# Patient Record
Sex: Female | Born: 1940 | Race: White | Hispanic: No | Marital: Married | Smoking: Never smoker
Health system: Southern US, Community
[De-identification: ages and names within clinical notes are randomized; demographics above are authoritative.]

## PROBLEM LIST (undated history)

## (undated) DIAGNOSIS — J302 Other seasonal allergic rhinitis: Secondary | ICD-10-CM

## (undated) DIAGNOSIS — I10 Essential (primary) hypertension: Secondary | ICD-10-CM

## (undated) DIAGNOSIS — K635 Polyp of colon: Secondary | ICD-10-CM

## (undated) HISTORY — PX: COLONOSCOPY: SHX174

## (undated) HISTORY — PX: LAPAROTOMY: SHX154

## (undated) HISTORY — PX: APPENDECTOMY: SHX54

## (undated) HISTORY — DX: Polyp of colon: K63.5

## (undated) HISTORY — DX: Other seasonal allergic rhinitis: J30.2

## (undated) HISTORY — PX: I & D EXTREMITY: SHX5045

## (undated) HISTORY — PX: ORIF HUMERUS FRACTURE: SHX2126

---

## 2009-06-06 ENCOUNTER — Emergency Department (HOSPITAL_COMMUNITY): Admission: EM | Admit: 2009-06-06 | Discharge: 2009-06-06 | Payer: Self-pay | Admitting: Emergency Medicine

## 2009-09-29 ENCOUNTER — Encounter: Payer: Self-pay | Admitting: Gastroenterology

## 2009-10-06 ENCOUNTER — Ambulatory Visit (HOSPITAL_COMMUNITY): Admission: RE | Admit: 2009-10-06 | Discharge: 2009-10-06 | Payer: Self-pay | Admitting: Gastroenterology

## 2009-10-06 ENCOUNTER — Ambulatory Visit: Payer: Self-pay | Admitting: Gastroenterology

## 2010-09-28 ENCOUNTER — Ambulatory Visit: Admission: AD | Admit: 2010-09-28 | Discharge: 2010-09-28 | Payer: Self-pay | Admitting: Internal Medicine

## 2011-03-18 LAB — URINALYSIS, ROUTINE W REFLEX MICROSCOPIC
Nitrite: POSITIVE — AB
Protein, ur: 30 mg/dL — AB
Specific Gravity, Urine: 1.015 (ref 1.005–1.030)
Urobilinogen, UA: 0.2 mg/dL (ref 0.0–1.0)

## 2011-03-18 LAB — DIFFERENTIAL
Basophils Relative: 0 % (ref 0–1)
Eosinophils Relative: 0 % (ref 0–5)
Lymphs Abs: 0.3 10*3/uL — ABNORMAL LOW (ref 0.7–4.0)
Monocytes Absolute: 0.3 10*3/uL (ref 0.1–1.0)
Neutro Abs: 8.5 10*3/uL — ABNORMAL HIGH (ref 1.7–7.7)

## 2011-03-18 LAB — CBC
HCT: 45.8 % (ref 36.0–46.0)
Hemoglobin: 15.9 g/dL — ABNORMAL HIGH (ref 12.0–15.0)
MCHC: 34.8 g/dL (ref 30.0–36.0)
MCV: 87.9 fL (ref 78.0–100.0)
WBC: 9.1 10*3/uL (ref 4.0–10.5)

## 2011-03-18 LAB — URINE CULTURE

## 2011-03-18 LAB — URINE MICROSCOPIC-ADD ON

## 2011-03-18 LAB — BASIC METABOLIC PANEL
BUN: 19 mg/dL (ref 6–23)
Calcium: 9.3 mg/dL (ref 8.4–10.5)
GFR calc non Af Amer: >60 mL/min (ref >60)
Glucose, Bld: 175 mg/dL — ABNORMAL HIGH (ref 70–99)
Potassium: 3.6 mEq/L (ref 3.5–5.1)
Sodium: 139 mEq/L (ref 135–145)

## 2013-09-06 ENCOUNTER — Encounter (HOSPITAL_COMMUNITY): Payer: Self-pay | Admitting: Pharmacy Technician

## 2013-09-06 NOTE — Patient Instructions (Addendum)
Tiffany Everett  09/06/2013   Your procedure is scheduled on:  09/13/13  Report to [facility] at [place] [facility].  Call this number if you have problems the morning of surgery: [phone]   Remember:   Do not eat food or drink liquids after midnight.   Take these medicines the morning of surgery with A SIP OF WATER: zantac   Do not wear jewelry, make-up or nail polish.  Do not wear lotions, powders, or perfumes. You may wear deodorant.  Do not shave 48 hours prior to surgery. Men may shave face and neck.  Do not bring valuables to the hospital.  [facility] is not responsible                  for any belongings or valuables.               Contacts, dentures or bridgework may not be worn into surgery.  Leave suitcase in the car. After surgery it may be brought to your room.  For patients admitted to the hospital, discharge time is determined by your                treatment team.               Patients discharged the day of surgery will not be allowed to drive  home.  Name and phone number of your driver: family  Special Instructions: N/A   Please read over the following fact sheets that you were given: Anesthesia Post-op Instructions and Care and Recovery After Surgery   PATIENT INSTRUCTIONS POST-ANESTHESIA  IMMEDIATELY FOLLOWING SURGERY:  Do not drive or operate machinery for the first twenty four hours after surgery.  Do not make any important decisions for twenty four hours after surgery or while taking narcotic pain medications or sedatives.  If you develop intractable nausea and vomiting or a severe headache please notify your doctor immediately.  FOLLOW-UP:  Please make an appointment with your surgeon as instructed. You do not need to follow up with anesthesia unless specifically instructed to do so.  WOUND CARE INSTRUCTIONS (if applicable):  Keep a dry clean dressing on the anesthesia/puncture wound site if there is drainage.  Once the wound has quit draining you may leave it  open to air.  Generally you should leave the bandage intact for twenty four hours unless there is drainage.  If the epidural site drains for more than 36-48 hours please call the anesthesia department.  QUESTIONS?:  Please feel free to call your physician or the hospital operator if you have any questions, and they will be happy to assist you.     Cataract Surgery  A cataract is a clouding of the lens of the eye. When a lens becomes cloudy, vision is reduced based on the degree and nature of the clouding. Surgery may be needed to improve vision. Surgery removes the cloudy lens and usually replaces it with a substitute lens (intraocular lens, IOL). LET YOUR EYE DOCTOR KNOW ABOUT:  Allergies to food or medicine.  Medicines taken including herbs, eyedrops, over-the-counter medicines, and creams.  Use of steroids (by mouth or creams).  Previous problems with anesthetics or numbing medicine.  History of bleeding problems or blood clots.  Previous surgery.  Other health problems, including diabetes and kidney problems.  Possibility of pregnancy, if this applies. RISKS AND COMPLICATIONS  Infection.  Inflammation of the eyeball (endophthalmitis) that can spread to both eyes (sympathetic ophthalmia).  Poor wound healing.  If an IOL is inserted, it can later fall out of proper position. This is very uncommon.  Clouding of the part of your eye that holds an IOL in place. This is called an "after-cataract." These are uncommon, but easily treated. BEFORE THE PROCEDURE  Do not eat or drink anything except small amounts of water for 8 to 12 before your surgery, or as directed by your caregiver.  Unless you are told otherwise, continue any eyedrops you have been prescribed.  Talk to your primary caregiver about all other medicines that you take (both prescription and non-prescription). In some cases, you may need to stop or change medicines near the time of your surgery. This is most  important if you are taking blood-thinning medicine.Do not stop medicines unless you are told to do so.  Arrange for someone to drive you to and from the procedure.  Do not put contact lenses in either eye on the day of your surgery. PROCEDURE There is more than one method for safely removing a cataract. Your doctor can explain the differences and help determine which is best for you. Phacoemulsification surgery is the most common form of cataract surgery.  An injection is given behind the eye or eyedrops are given to make this a painless procedure.  A small cut (incision) is made on the edge of the clear, dome-shaped surface that covers the front of the eye (cornea).  A tiny probe is painlessly inserted into the eye. This device gives off ultrasound waves that soften and break up the cloudy center of the lens. This makes it easier for the cloudy lens to be removed by suction.  An IOL may be implanted.  The normal lens of the eye is covered by a clear capsule. Part of that capsule is intentionally left in the eye to support the IOL.  Your surgeon may or may not use stitches to close the incision. There are other forms of cataract surgery that require a larger incision and stiches to close the eye. This approach is taken in cases where the doctor feels that the cataract cannot be easily removed using phacoemulsification. AFTER THE PROCEDURE  When an IOL is implanted, it does not need care. It becomes a permanent part of your eye and cannot be seen or felt.  Your doctor will schedule follow-up exams to check on your progress.  Review your other medicines with your doctor to see which can be resumed after surgery.  Use eyedrops or take medicine as prescribed by your doctor. Document Released: 11/14/2011 Document Revised: 02/17/2012 Document Reviewed: 11/14/2011 [facility] Patient Information 2014 [place], [place].

## 2013-09-07 ENCOUNTER — Encounter (HOSPITAL_COMMUNITY): Payer: Self-pay

## 2013-09-07 ENCOUNTER — Encounter (HOSPITAL_COMMUNITY)
Admission: RE | Admit: 2013-09-07 | Discharge: 2013-09-07 | Disposition: A | Discharge status: Home / Self Care | Payer: Medicare Other | Source: Ambulatory Visit | Attending: Ophthalmology | Admitting: Ophthalmology

## 2013-09-07 DIAGNOSIS — Z01812 Encounter for preprocedural laboratory examination: Secondary | ICD-10-CM | POA: Insufficient documentation

## 2013-09-07 DIAGNOSIS — Z01818 Encounter for other preprocedural examination: Secondary | ICD-10-CM | POA: Insufficient documentation

## 2013-09-07 DIAGNOSIS — Z01810 Encounter for preprocedural cardiovascular examination: Secondary | ICD-10-CM | POA: Insufficient documentation

## 2013-09-07 LAB — BASIC METABOLIC PANEL
BUN: 17 mg/dL (ref 6–23)
CO2: 26 mEq/L (ref 19–32)
Chloride: 103 mEq/L (ref 96–112)
Glucose, Bld: 97 mg/dL (ref 70–99)
Potassium: 3.8 mEq/L (ref 3.5–5.1)

## 2013-09-13 ENCOUNTER — Ambulatory Visit (HOSPITAL_COMMUNITY): Payer: Medicare Other | Admitting: Anesthesiology

## 2013-09-13 ENCOUNTER — Ambulatory Visit (HOSPITAL_COMMUNITY)
Admission: RE | Admit: 2013-09-13 | Discharge: 2013-09-13 | Disposition: A | Discharge status: Home / Self Care | Payer: Medicare Other | Source: Ambulatory Visit | Attending: Ophthalmology | Admitting: Ophthalmology

## 2013-09-13 ENCOUNTER — Encounter (HOSPITAL_COMMUNITY): Payer: Self-pay | Admitting: *Deleted

## 2013-09-13 ENCOUNTER — Encounter (HOSPITAL_COMMUNITY): Payer: Self-pay | Admitting: Anesthesiology

## 2013-09-13 ENCOUNTER — Encounter (HOSPITAL_COMMUNITY)
Admission: RE | Disposition: A | Discharge status: Home / Self Care | Payer: Self-pay | Source: Ambulatory Visit | Attending: Ophthalmology

## 2013-09-13 DIAGNOSIS — H2510 Age-related nuclear cataract, unspecified eye: Secondary | ICD-10-CM | POA: Insufficient documentation

## 2013-09-13 HISTORY — PX: CATARACT EXTRACTION W/PHACO: SHX586

## 2013-09-13 SURGERY — PHACOEMULSIFICATION, CATARACT, WITH IOL INSERTION
Anesthesia: Monitor Anesthesia Care | Site: Eye | Laterality: Right | Wound class: Clean

## 2013-09-13 MED ORDER — CYCLOPENTOLATE-PHENYLEPHRINE 0.2-1 % OP SOLN
1.0000 [drp] | OPHTHALMIC | Status: AC
  Administered 2013-09-13 (×3): 1 [drp] via OPHTHALMIC

## 2013-09-13 MED ORDER — FENTANYL CITRATE 0.05 MG/ML IJ SOLN
25.0000 ug | INTRAMUSCULAR | Status: AC
  Administered 2013-09-13 (×2): 25 ug via INTRAVENOUS

## 2013-09-13 MED ORDER — BSS IO SOLN
INTRAOCULAR | Status: DC | PRN
  Administered 2013-09-13: 15 mL via INTRAOCULAR

## 2013-09-13 MED ORDER — POVIDONE-IODINE 5 % OP SOLN
OPHTHALMIC | Status: DC | PRN
  Administered 2013-09-13: 1 via OPHTHALMIC

## 2013-09-13 MED ORDER — NA HYALUR & NA CHOND-NA HYALUR 0.55-0.5 ML IO KIT
PACK | INTRAOCULAR | Status: DC | PRN
  Administered 2013-09-13: 1 via OPHTHALMIC

## 2013-09-13 MED ORDER — LIDOCAINE HCL 3.5 % OP GEL
OPHTHALMIC | Status: DC | PRN
  Administered 2013-09-13: 1 via OPHTHALMIC

## 2013-09-13 MED ORDER — CYCLOPENTOLATE-PHENYLEPHRINE OP SOLN OPTIME - NO CHARGE
OPHTHALMIC | Status: AC
  Filled 2013-09-13: qty 2

## 2013-09-13 MED ORDER — MIDAZOLAM HCL 2 MG/2ML IJ SOLN
1.0000 mg | INTRAMUSCULAR | Status: DC | PRN
  Administered 2013-09-13: 2 mg via INTRAVENOUS

## 2013-09-13 MED ORDER — FENTANYL CITRATE 0.05 MG/ML IJ SOLN
INTRAMUSCULAR | Status: AC
  Filled 2013-09-13: qty 2

## 2013-09-13 MED ORDER — MIDAZOLAM HCL 2 MG/2ML IJ SOLN
INTRAMUSCULAR | Status: AC
  Filled 2013-09-13: qty 2

## 2013-09-13 MED ORDER — LIDOCAINE HCL 3.5 % OP GEL
OPHTHALMIC | Status: AC
  Filled 2013-09-13: qty 1

## 2013-09-13 MED ORDER — EPINEPHRINE HCL 1 MG/ML IJ SOLN
INTRAOCULAR | Status: DC | PRN
  Administered 2013-09-13: 08:00:00

## 2013-09-13 MED ORDER — LACTATED RINGERS IV SOLN
INTRAVENOUS | Status: DC
  Administered 2013-09-13: 07:00:00 via INTRAVENOUS

## 2013-09-13 MED ORDER — TETRACAINE HCL 0.5 % OP SOLN
1.0000 [drp] | OPHTHALMIC | Status: AC
  Administered 2013-09-13 (×3): 1 [drp] via OPHTHALMIC
  Filled 2013-09-13: qty 2

## 2013-09-13 MED ORDER — EPINEPHRINE HCL 1 MG/ML IJ SOLN
INTRAMUSCULAR | Status: AC
  Filled 2013-09-13: qty 1

## 2013-09-13 MED ORDER — TETRACAINE HCL 0.5 % OP SOLN
OPHTHALMIC | Status: DC | PRN
  Administered 2013-09-13: 1 [drp] via OPHTHALMIC

## 2013-09-13 SURGICAL SUPPLY — 27 items
CAPSULAR TENSION RING-AMO (OPHTHALMIC RELATED) IMPLANT
CLOTH BEACON ORANGE TIMEOUT ST (SAFETY) ×2 IMPLANT
GLOVE BIO SURGEON STRL SZ7.5 (GLOVE) IMPLANT
GLOVE BIOGEL M 6.5 STRL (GLOVE) IMPLANT
GLOVE BIOGEL PI IND STRL 6.5 (GLOVE) ×2 IMPLANT
GLOVE BIOGEL PI IND STRL 7.0 (GLOVE) IMPLANT
GLOVE BIOGEL PI INDICATOR 6.5 (GLOVE) ×2
GLOVE BIOGEL PI INDICATOR 7.0 (GLOVE)
GLOVE ECLIPSE 6.5 STRL STRAW (GLOVE) IMPLANT
GLOVE ECLIPSE 7.5 STRL STRAW (GLOVE) IMPLANT
GLOVE EXAM NITRILE LRG STRL (GLOVE) IMPLANT
GLOVE EXAM NITRILE MD LF STRL (GLOVE) ×2 IMPLANT
GLOVE SKINSENSE NS SZ6.5 (GLOVE)
GLOVE SKINSENSE NS SZ7.0 (GLOVE)
GLOVE SKINSENSE STRL SZ6.5 (GLOVE) IMPLANT
GLOVE SKINSENSE STRL SZ7.0 (GLOVE) IMPLANT
INST SET CATARACT ~~LOC~~ (KITS) ×2 IMPLANT
KIT VITRECTOMY (OPHTHALMIC RELATED) IMPLANT
PAD ARMBOARD 7.5X6 YLW CONV (MISCELLANEOUS) ×2 IMPLANT
PROC W NO LENS (INTRAOCULAR LENS)
PROC W SPEC LENS (INTRAOCULAR LENS)
PROCESS W NO LENS (INTRAOCULAR LENS) IMPLANT
PROCESS W SPEC LENS (INTRAOCULAR LENS) IMPLANT
RING MALYGIN (MISCELLANEOUS) IMPLANT
SIGHTPATH CAT PROC W REG LENS (Ophthalmic Related) ×2 IMPLANT
VISCOELASTIC ADDITIONAL (OPHTHALMIC RELATED) IMPLANT
WATER STERILE IRR 250ML POUR (IV SOLUTION) ×2 IMPLANT

## 2013-09-13 NOTE — Transfer of Care (Signed)
Immediate Anesthesia Transfer of Care Note  Patient: Tiffany Everett  Procedure(s) Performed: Procedure(s) (LRB): CATARACT EXTRACTION PHACO AND INTRAOCULAR LENS PLACEMENT (IOC) (Right)  Patient Location: Shortstay  Anesthesia Type: MAC  Level of Consciousness: awake  Airway & Oxygen Therapy: Patient Spontanous Breathing   Post-op Assessment: Report given to PACU RN, Post -op Vital signs reviewed and stable and Patient moving all extremities  Post vital signs: Reviewed and stable  Complications: No apparent anesthesia complications

## 2013-09-13 NOTE — H&P (Signed)
I have reviewed the pre printed H&P, the patient was re-examined, and I have identified no significant interval changes in the patient's medical condition.  There is no change in the plan of care since the history and physical of record. 

## 2013-09-13 NOTE — Op Note (Signed)
See scanned op note 

## 2013-09-13 NOTE — Brief Op Note (Signed)
09/13/2013  9:00 AM  PATIENT:  Tiffany Everett  72 y.o. female  PRE-OPERATIVE DIAGNOSIS:  surgical cataract right eye  POST-OPERATIVE DIAGNOSIS:  surgical cataract right eye  PROCEDURE:  Procedure(s): CATARACT EXTRACTION PHACO AND INTRAOCULAR LENS PLACEMENT (IOC)  SURGEON:  Surgeon(s): Susa Simmonds, MD  ASSISTANTS:  Laurena Bering, CST   ANESTHESIA STAFF: Anesthesiologist: Laurene Footman, MD CRNA: Franco Nones, CRNA  ANESTHESIA:   topical and MAC  REQUESTED LENS POWER: 24.0  LENS IMPLANT INFORMATION: Alcon [id number]  S/n [id number]  exp05/2019  CUMULATIVE DISSIPATED ENERGY:  3.25  INDICATIONS:see office H&P on chart  OP FINDINGS:moderately dense NS  COMPLICATIONS:None  DICTATION #: see scanned op note  PLAN OF CARE: as above  PATIENT DISPOSITION:  Short Stay

## 2013-09-13 NOTE — Anesthesia Procedure Notes (Signed)
Procedure Name: MAC Date/Time: 09/13/2013 7:50 AM Performed by: Franco Nones Pre-anesthesia Checklist: Patient identified, Emergency Drugs available, Suction available, Timeout performed and Patient being monitored Patient Re-evaluated:Patient Re-evaluated prior to inductionOxygen Delivery Method: Nasal Cannula

## 2013-09-13 NOTE — Anesthesia Postprocedure Evaluation (Signed)
  Anesthesia Post-op Note  Patient: Tiffany Everett  Procedure(s) Performed: Procedure(s) (LRB): CATARACT EXTRACTION PHACO AND INTRAOCULAR LENS PLACEMENT (IOC) (Right)  Patient Location:  Short Stay  Anesthesia Type: MAC  Level of Consciousness: awake  Airway and Oxygen Therapy: Patient Spontanous Breathing  Post-op Pain: none  Post-op Assessment: Post-op Vital signs reviewed, Patient's Cardiovascular Status Stable, Respiratory Function Stable, Patent Airway, No signs of Nausea or vomiting and Pain level controlled  Post-op Vital Signs: Reviewed and stable  Complications: No apparent anesthesia complications

## 2013-09-13 NOTE — Anesthesia Preprocedure Evaluation (Signed)
Anesthesia Evaluation  Patient identified by MRN, date of birth, ID band Patient awake    Reviewed: Allergy & Precautions, H&P , NPO status , Patient's Chart, lab work & pertinent test results  History of Anesthesia Complications Negative for: history of anesthetic complications  Airway Mallampati: II TM Distance: >3 FB   Mouth opening: Limited Mouth Opening Comment: Hx TMJ with limited opening Dental  (+) Teeth Intact   Pulmonary neg pulmonary ROS,  breath sounds clear to auscultation        Cardiovascular negative cardio ROS  Rhythm:Regular Rate:Normal     Neuro/Psych    GI/Hepatic negative GI ROS,   Endo/Other    Renal/GU      Musculoskeletal   Abdominal   Peds  Hematology   Anesthesia Other Findings   Reproductive/Obstetrics                           Anesthesia Physical Anesthesia Plan  ASA: I  Anesthesia Plan: MAC   Post-op Pain Management:    Induction: Intravenous  Airway Management Planned: Nasal Cannula  Additional Equipment:   Intra-op Plan:   Post-operative Plan:   Informed Consent: I have reviewed the patients History and Physical, chart, labs and discussed the procedure including the risks, benefits and alternatives for the proposed anesthesia with the patient or authorized representative who has indicated his/her understanding and acceptance.     Plan Discussed with:   Anesthesia Plan Comments:         Anesthesia Quick Evaluation

## 2013-09-14 ENCOUNTER — Encounter (HOSPITAL_COMMUNITY): Payer: Self-pay | Admitting: Ophthalmology

## 2013-11-17 MED ORDER — FENTANYL CITRATE 0.05 MG/ML IJ SOLN: 25.0000 ug | INTRAMUSCULAR | Status: DC | PRN

## 2013-11-17 MED ORDER — ONDANSETRON HCL 4 MG/2ML IJ SOLN: 4.0000 mg | Freq: Once | INTRAMUSCULAR | Status: AC | PRN

## 2013-11-17 NOTE — Patient Instructions (Signed)
Your procedure is scheduled on: 11/22/2013  Report to [facility] at  900  AM.  Call this number if you have problems the morning of surgery: [phone]   Do not eat food or drink liquids :After Midnight.      Take these medicines the morning of surgery with A SIP OF WATER: zantac   Do not wear jewelry, make-up or nail polish.  Do not wear lotions, powders, or perfumes.   Do not shave 48 hours prior to surgery.  Do not bring valuables to the hospital.  Contacts, dentures or bridgework may not be worn into surgery.  Leave suitcase in the car. After surgery it may be brought to your room.  For patients admitted to the hospital, checkout time is 11:00 AM the day of discharge.   Patients discharged the day of surgery will not be allowed to drive home.  :     Please read over the following fact sheets that you were given: Coughing and Deep Breathing, Surgical Site Infection Prevention, Anesthesia Post-op Instructions and Care and Recovery After Surgery    Cataract A cataract is a clouding of the lens of the eye. When a lens becomes cloudy, vision is reduced based on the degree and nature of the clouding. Many cataracts reduce vision to some degree. Some cataracts make people more near-sighted as they develop. Other cataracts increase glare. Cataracts that are ignored and become worse can sometimes look white. The white color can be seen through the pupil. CAUSES   Aging. However, cataracts may occur at any age, even in newborns.   Certain drugs.   Trauma to the eye.   Certain diseases such as diabetes.   Specific eye diseases such as chronic inflammation inside the eye or a sudden attack of a rare form of glaucoma.   Inherited or acquired medical problems.  SYMPTOMS   Gradual, progressive drop in vision in the affected eye.   Severe, rapid visual loss. This most often happens when trauma is the cause.  DIAGNOSIS  To detect a cataract, an eye doctor examines the lens. Cataracts  are best diagnosed with an exam of the eyes with the pupils enlarged (dilated) by drops.  TREATMENT  For an early cataract, vision may improve by using different eyeglasses or stronger lighting. If that does not help your vision, surgery is the only effective treatment. A cataract needs to be surgically removed when vision loss interferes with your everyday activities, such as driving, reading, or watching TV. A cataract may also have to be removed if it prevents examination or treatment of another eye problem. Surgery removes the cloudy lens and usually replaces it with a substitute lens (intraocular lens, IOL).  At a time when both you and your doctor agree, the cataract will be surgically removed. If you have cataracts in both eyes, only one is usually removed at a time. This allows the operated eye to heal and be out of danger from any possible problems after surgery (such as infection or poor wound healing). In rare cases, a cataract may be doing damage to your eye. In these cases, your caregiver may advise surgical removal right away. The vast majority of people who have cataract surgery have better vision afterward. HOME CARE INSTRUCTIONS  If you are not planning surgery, you may be asked to do the following:  Use different eyeglasses.   Use stronger or brighter lighting.   Ask your eye doctor about reducing your medicine dose or changing medicines if  it is thought that a medicine caused your cataract. Changing medicines does not make the cataract go away on its own.   Become familiar with your surroundings. Poor vision can lead to injury. Avoid bumping into things on the affected side. You are at a higher risk for tripping or falling.   Exercise extreme care when driving or operating machinery.   Wear sunglasses if you are sensitive to bright light or experiencing problems with glare.  SEEK IMMEDIATE MEDICAL CARE IF:   You have a worsening or sudden vision loss.   You notice redness,  swelling, or increasing pain in the eye.   You have a fever.  Document Released: 11/25/2005 Document Revised: 11/14/2011 Document Reviewed: 07/19/2011 [facility] Patient Information 2012 [place].PATIENT INSTRUCTIONS POST-ANESTHESIA  IMMEDIATELY FOLLOWING SURGERY:  Do not drive or operate machinery for the first twenty four hours after surgery.  Do not make any important decisions for twenty four hours after surgery or while taking narcotic pain medications or sedatives.  If you develop intractable nausea and vomiting or a severe headache please notify your doctor immediately.  FOLLOW-UP:  Please make an appointment with your surgeon as instructed. You do not need to follow up with anesthesia unless specifically instructed to do so.  WOUND CARE INSTRUCTIONS (if applicable):  Keep a dry clean dressing on the anesthesia/puncture wound site if there is drainage.  Once the wound has quit draining you may leave it open to air.  Generally you should leave the bandage intact for twenty four hours unless there is drainage.  If the epidural site drains for more than 36-48 hours please call the anesthesia department.  QUESTIONS?:  Please feel free to call your physician or the hospital operator if you have any questions, and they will be happy to assist you.

## 2013-11-18 ENCOUNTER — Encounter (HOSPITAL_COMMUNITY): Payer: Self-pay | Admitting: Pharmacy Technician

## 2013-11-18 ENCOUNTER — Encounter (HOSPITAL_COMMUNITY)
Admission: RE | Admit: 2013-11-18 | Discharge: 2013-11-18 | Disposition: A | Discharge status: Home / Self Care | Payer: Medicare Other | Source: Ambulatory Visit | Attending: Anesthesiology | Admitting: Anesthesiology

## 2013-11-22 ENCOUNTER — Ambulatory Visit (HOSPITAL_COMMUNITY)
Admission: RE | Admit: 2013-11-22 | Discharge: 2013-11-22 | Disposition: A | Discharge status: Home / Self Care | Payer: Medicare Other | Source: Ambulatory Visit | Attending: Ophthalmology | Admitting: Ophthalmology

## 2013-11-22 ENCOUNTER — Encounter (HOSPITAL_COMMUNITY)
Admission: RE | Disposition: A | Discharge status: Home / Self Care | Payer: Self-pay | Source: Ambulatory Visit | Attending: Ophthalmology

## 2013-11-22 ENCOUNTER — Ambulatory Visit (HOSPITAL_COMMUNITY): Payer: Medicare Other | Admitting: Anesthesiology

## 2013-11-22 ENCOUNTER — Encounter (HOSPITAL_COMMUNITY): Payer: Self-pay | Admitting: *Deleted

## 2013-11-22 ENCOUNTER — Encounter (HOSPITAL_COMMUNITY): Payer: Medicare Other | Admitting: Anesthesiology

## 2013-11-22 DIAGNOSIS — H2510 Age-related nuclear cataract, unspecified eye: Secondary | ICD-10-CM | POA: Insufficient documentation

## 2013-11-22 HISTORY — PX: CATARACT EXTRACTION W/PHACO: SHX586

## 2013-11-22 SURGERY — PHACOEMULSIFICATION, CATARACT, WITH IOL INSERTION
Anesthesia: Monitor Anesthesia Care | Site: Eye | Laterality: Left

## 2013-11-22 MED ORDER — FENTANYL CITRATE 0.05 MG/ML IJ SOLN
25.0000 ug | INTRAMUSCULAR | Status: AC
  Administered 2013-11-22: 25 ug via INTRAVENOUS

## 2013-11-22 MED ORDER — NA HYALUR & NA CHOND-NA HYALUR 0.55-0.5 ML IO KIT
PACK | INTRAOCULAR | Status: DC | PRN
  Administered 2013-11-22: 1 via OPHTHALMIC

## 2013-11-22 MED ORDER — MIDAZOLAM HCL 2 MG/2ML IJ SOLN
INTRAMUSCULAR | Status: AC
  Filled 2013-11-22: qty 2

## 2013-11-22 MED ORDER — CYCLOPENTOLATE-PHENYLEPHRINE 0.2-1 % OP SOLN
1.0000 [drp] | OPHTHALMIC | Status: AC
  Administered 2013-11-22 (×3): 1 [drp] via OPHTHALMIC

## 2013-11-22 MED ORDER — LIDOCAINE HCL 3.5 % OP GEL
OPHTHALMIC | Status: DC | PRN
  Administered 2013-11-22: 1 via OPHTHALMIC

## 2013-11-22 MED ORDER — TETRACAINE HCL 0.5 % OP SOLN
OPHTHALMIC | Status: AC
  Filled 2013-11-22: qty 2

## 2013-11-22 MED ORDER — FENTANYL CITRATE 0.05 MG/ML IJ SOLN
INTRAMUSCULAR | Status: AC
  Filled 2013-11-22: qty 2

## 2013-11-22 MED ORDER — CYCLOPENTOLATE-PHENYLEPHRINE OP SOLN OPTIME - NO CHARGE
OPHTHALMIC | Status: AC
  Filled 2013-11-22: qty 2

## 2013-11-22 MED ORDER — TETRACAINE HCL 0.5 % OP SOLN
OPHTHALMIC | Status: DC | PRN
  Administered 2013-11-22: 1 [drp] via OPHTHALMIC

## 2013-11-22 MED ORDER — MIDAZOLAM HCL 2 MG/2ML IJ SOLN
1.0000 mg | INTRAMUSCULAR | Status: DC | PRN
  Administered 2013-11-22 (×2): 2 mg via INTRAVENOUS

## 2013-11-22 MED ORDER — TETRACAINE HCL 0.5 % OP SOLN
1.0000 [drp] | OPHTHALMIC | Status: AC
  Administered 2013-11-22 (×3): 1 [drp] via OPHTHALMIC

## 2013-11-22 MED ORDER — POVIDONE-IODINE 5 % OP SOLN
OPHTHALMIC | Status: DC | PRN
  Administered 2013-11-22: 1 via OPHTHALMIC

## 2013-11-22 MED ORDER — LIDOCAINE HCL 3.5 % OP GEL
OPHTHALMIC | Status: AC
  Filled 2013-11-22: qty 1

## 2013-11-22 MED ORDER — BSS IO SOLN
INTRAOCULAR | Status: DC | PRN
  Administered 2013-11-22: 15 mL via INTRAOCULAR

## 2013-11-22 MED ORDER — LACTATED RINGERS IV SOLN
INTRAVENOUS | Status: DC
  Administered 2013-11-22: 08:00:00 via INTRAVENOUS

## 2013-11-22 MED ORDER — EPINEPHRINE HCL 1 MG/ML IJ SOLN
INTRAOCULAR | Status: DC | PRN
  Administered 2013-11-22: 09:00:00

## 2013-11-22 MED ORDER — EPINEPHRINE HCL 1 MG/ML IJ SOLN
INTRAMUSCULAR | Status: AC
  Filled 2013-11-22: qty 1

## 2013-11-22 MED ORDER — LIDOCAINE HCL 3.5 % OP GEL
1.0000 "application " | Freq: Once | OPHTHALMIC | Status: AC
  Administered 2013-11-22: 1 via OPHTHALMIC

## 2013-11-22 SURGICAL SUPPLY — 27 items
CAPSULAR TENSION RING-AMO (OPHTHALMIC RELATED) IMPLANT
CLOTH BEACON ORANGE TIMEOUT ST (SAFETY) ×2 IMPLANT
GLOVE BIO SURGEON STRL SZ7.5 (GLOVE) IMPLANT
GLOVE BIOGEL M 6.5 STRL (GLOVE) IMPLANT
GLOVE BIOGEL PI IND STRL 6.5 (GLOVE) ×2 IMPLANT
GLOVE BIOGEL PI IND STRL 7.0 (GLOVE) IMPLANT
GLOVE BIOGEL PI INDICATOR 6.5 (GLOVE) ×2
GLOVE BIOGEL PI INDICATOR 7.0 (GLOVE)
GLOVE ECLIPSE 6.5 STRL STRAW (GLOVE) IMPLANT
GLOVE ECLIPSE 7.5 STRL STRAW (GLOVE) IMPLANT
GLOVE EXAM NITRILE LRG STRL (GLOVE) IMPLANT
GLOVE EXAM NITRILE MD LF STRL (GLOVE) IMPLANT
GLOVE SKINSENSE NS SZ6.5 (GLOVE)
GLOVE SKINSENSE NS SZ7.0 (GLOVE)
GLOVE SKINSENSE STRL SZ6.5 (GLOVE) IMPLANT
GLOVE SKINSENSE STRL SZ7.0 (GLOVE) IMPLANT
INST SET CATARACT ~~LOC~~ (KITS) ×2 IMPLANT
KIT VITRECTOMY (OPHTHALMIC RELATED) IMPLANT
PAD ARMBOARD 7.5X6 YLW CONV (MISCELLANEOUS) ×2 IMPLANT
PROC W NO LENS (INTRAOCULAR LENS)
PROC W SPEC LENS (INTRAOCULAR LENS)
PROCESS W NO LENS (INTRAOCULAR LENS) IMPLANT
PROCESS W SPEC LENS (INTRAOCULAR LENS) IMPLANT
RING MALYGIN (MISCELLANEOUS) IMPLANT
SIGHTPATH CAT PROC W REG LENS (Ophthalmic Related) ×2 IMPLANT
VISCOELASTIC ADDITIONAL (OPHTHALMIC RELATED) IMPLANT
WATER STERILE IRR 250ML POUR (IV SOLUTION) ×2 IMPLANT

## 2013-11-22 NOTE — Transfer of Care (Signed)
Immediate Anesthesia Transfer of Care Note  Patient: Tiffany Everett  Procedure(s) Performed: Procedure(s) with comments: CATARACT EXTRACTION PHACO AND INTRAOCULAR LENS PLACEMENT (IOC) (Left) - CDE: 3.95  Patient Location: Short Stay  Anesthesia Type:MAC  Level of Consciousness: awake, alert , oriented and patient cooperative  Airway & Oxygen Therapy: Patient Spontanous Breathing  Post-op Assessment: Report given to PACU RN, Post -op Vital signs reviewed and stable and Patient moving all extremities  Post vital signs: Reviewed and stable  Complications: No apparent anesthesia complications

## 2013-11-22 NOTE — Brief Op Note (Signed)
11/22/2013  12:23 PM  PATIENT:  Tiffany Everett  72 y.o. female  PRE-OPERATIVE DIAGNOSIS:  nuclear cataract left eye  POST-OPERATIVE DIAGNOSIS:  nuclear cataract left eye  PROCEDURE:  Procedure(s): CATARACT EXTRACTION PHACO AND INTRAOCULAR LENS PLACEMENT (IOC)  SURGEON:  Surgeon(s): Susa Simmonds, MD  ASSISTANTS: Laurena Bering, CST   ANESTHESIA STAFF: Anesthesiologist: Laurene Footman, MD CRNA: Despina Hidden, CRNA  ANESTHESIA:   topical and MAC  REQUESTED LENS POWER: 25.5  LENS IMPLANT INFORMATION:  Alcon SN60 WF  S/n [id number]  exp02/2017 CUMULATIVE DISSIPATED ENERGY:3.95  INDICATIONS:see scanned office H&P  OP FINDINGS:dense NS  COMPLICATIONS:None  DICTATION #: see scanned op note  PLAN OF CARE: as above  PATIENT DISPOSITION:  Short Stay

## 2013-11-22 NOTE — H&P (Signed)
I have reviewed the pre printed H&P, the patient was re-examined, and I have identified no significant interval changes in the patient's medical condition.  There is no change in the plan of care since the history and physical of record. 

## 2013-11-22 NOTE — Op Note (Signed)
See scanned op note 

## 2013-11-22 NOTE — Anesthesia Postprocedure Evaluation (Signed)
  Anesthesia Post-op Note  Patient: Tiffany Everett  Procedure(s) Performed: Procedure(s) with comments: CATARACT EXTRACTION PHACO AND INTRAOCULAR LENS PLACEMENT (IOC) (Left) - CDE: 3.95  Patient Location: Short Stay  Anesthesia Type:MAC  Level of Consciousness: awake, alert , oriented and patient cooperative  Airway and Oxygen Therapy: Patient Spontanous Breathing  Post-op Pain: none  Post-op Assessment: Post-op Vital signs reviewed, Patient's Cardiovascular Status Stable, Respiratory Function Stable, Patent Airway and Pain level controlled  Post-op Vital Signs: Reviewed and stable  Complications: No apparent anesthesia complications

## 2013-11-22 NOTE — Anesthesia Preprocedure Evaluation (Signed)
Anesthesia Evaluation  Patient identified by MRN, date of birth, ID band Patient awake    Reviewed: Allergy & Precautions, H&P , NPO status , Patient's Chart, lab work & pertinent test results  History of Anesthesia Complications Negative for: history of anesthetic complications  Airway Mallampati: II TM Distance: >3 FB   Mouth opening: Limited Mouth Opening Comment: Hx TMJ with limited opening Dental  (+) Teeth Intact   Pulmonary neg pulmonary ROS,  breath sounds clear to auscultation        Cardiovascular negative cardio ROS  Rhythm:Regular Rate:Normal     Neuro/Psych    GI/Hepatic negative GI ROS,   Endo/Other    Renal/GU      Musculoskeletal   Abdominal   Peds  Hematology   Anesthesia Other Findings   Reproductive/Obstetrics                           Anesthesia Physical Anesthesia Plan  ASA: I  Anesthesia Plan: MAC   Post-op Pain Management:    Induction: Intravenous  Airway Management Planned: Nasal Cannula  Additional Equipment:   Intra-op Plan:   Post-operative Plan:   Informed Consent: I have reviewed the patients History and Physical, chart, labs and discussed the procedure including the risks, benefits and alternatives for the proposed anesthesia with the patient or authorized representative who has indicated his/her understanding and acceptance.     Plan Discussed with:   Anesthesia Plan Comments:         Anesthesia Quick Evaluation  

## 2013-11-24 ENCOUNTER — Encounter (HOSPITAL_COMMUNITY): Payer: Self-pay | Admitting: Ophthalmology

## 2014-08-17 ENCOUNTER — Encounter: Payer: Self-pay | Admitting: Gastroenterology

## 2015-03-02 DIAGNOSIS — Z6827 Body mass index (BMI) 27.0-27.9, adult: Secondary | ICD-10-CM | POA: Diagnosis not present

## 2015-03-02 DIAGNOSIS — H9192 Unspecified hearing loss, left ear: Secondary | ICD-10-CM | POA: Diagnosis not present

## 2015-03-02 DIAGNOSIS — H6122 Impacted cerumen, left ear: Secondary | ICD-10-CM | POA: Diagnosis not present

## 2015-03-10 DIAGNOSIS — I10 Essential (primary) hypertension: Secondary | ICD-10-CM | POA: Diagnosis not present

## 2015-03-10 DIAGNOSIS — E782 Mixed hyperlipidemia: Secondary | ICD-10-CM | POA: Diagnosis not present

## 2015-03-21 ENCOUNTER — Telehealth: Payer: Self-pay

## 2015-03-21 NOTE — Telephone Encounter (Signed)
LMOM to call.  I need to reschedule appt. ( It was inadvertently put in for 4/25/20170. The slot for 04/03/2015 is taken. Will reschedule pt when she calls.

## 2015-03-21 NOTE — Telephone Encounter (Signed)
[facility] referral was sent and is stapled onto the patient's records

## 2015-03-21 NOTE — Telephone Encounter (Signed)
Pt was referred by Darlin Cocor.Zack Hall for screening colonoscopy. Her last one was by Dr. Darrick PennaFields 10/06/2009 and she was to have her next in 5 years. ( Personal hx of polyps) Pt is scheduled for OV with Wynne DustEric Gill, NP on 04/03/2015 at 1:30 PM.  Darl PikesSusan will check and see if she needs another referral from [facility] for the OV.

## 2015-03-23 NOTE — Telephone Encounter (Signed)
Pt has been rescheduled to 04/06/2015 at 2:00 PM with Wynne DustEric Gill, NP.

## 2015-04-06 ENCOUNTER — Ambulatory Visit: Payer: Commercial Managed Care - HMO | Admitting: Nurse Practitioner

## 2015-04-24 ENCOUNTER — Encounter: Payer: Self-pay | Admitting: Nurse Practitioner

## 2015-04-24 ENCOUNTER — Other Ambulatory Visit: Payer: Self-pay

## 2015-04-24 ENCOUNTER — Ambulatory Visit (INDEPENDENT_AMBULATORY_CARE_PROVIDER_SITE_OTHER): Payer: Commercial Managed Care - HMO | Admitting: Nurse Practitioner

## 2015-04-24 VITALS — BP 144/86 | HR 84 | Temp 97.1°F | Ht 68.0 in | Wt 191.2 lb

## 2015-04-24 DIAGNOSIS — Z860100 Personal history of colon polyps, unspecified: Secondary | ICD-10-CM

## 2015-04-24 MED ORDER — NA SULFATE-K SULFATE-MG SULF 17.5-3.13-1.6 GM/177ML PO SOLN: 1.0000 | Freq: Once | ORAL | Status: AC

## 2015-04-24 NOTE — Assessment & Plan Note (Signed)
[age] old female presents for surveillance colonoscopy due to high risk individual for history of colon polyps as well as family history of colon cancer as both her mother and father had colon cancer in her 5270s. Last colonoscopy completed about 6 years ago showed no polyps, recommend repeat colonoscopy in 5 years. Patient is essentially a symptomatically GI standpoint. 6 she cannot tolerate high-volume prep is requesting a low volume prep. At this point we'll move forward with this surveillance colonoscopy.  Proceed with colonoscopy with Dr. Darrick PennaFields in the near future. The risks, benefits, and alternatives have been discussed in detail with the patient. They state understanding and desire to proceed.   Patient is not on any anticoagulants, antidiabetic medications, long-term pain medications, antianxiety medications, or antidepressants. Should be appropriate for conscious sedation.

## 2015-04-24 NOTE — Patient Instructions (Signed)
1. We will schedule your procedure (colonoscopy) for you. 2. Further recommendations to be based on results your procedure. 

## 2015-04-24 NOTE — Progress Notes (Signed)
Primary Care Physician:  Catalina PizzaHALL, ZACH, MD Primary Gastroenterologist:  Dr. Darrick PennaFields  Chief Complaint  Patient presents with  . Colonoscopy    hx of polyps    HPI:   74 year old female referred for repeat surveillance colonoscopy. Has a personal history of polyps. Last colonoscopy 2010 found frequent sigmoid colon diverticula, otherwise no polyps, masses, inflammatory changes, or AVMs. Recommend repeat in 5 years.  Today she states she is doing quite well. Denies abdominal pain, N/V, hematochezia, melena, chest pain, dyspnea, fatigue, unintentional weight loss, fever, chills. Denies any other upper or lower GI symptoms.    Past Medical History  Diagnosis Date  . Seasonal allergies   . Colon polyps     Past Surgical History  Procedure Laterality Date  . Laparotomy      oopherectomy  . Appendectomy    . Orif humerus fracture Left   . Cataract extraction w/phaco Right 09/13/2013    Procedure: CATARACT EXTRACTION PHACO AND INTRAOCULAR LENS PLACEMENT (IOC);  Surgeon: Susa Simmondsarroll F Haines, MD;  Location: [facility];  Service: Ophthalmology;  Laterality: Right;  CDE:3.25  . I&d extremity Left     lateral wrist  . Cesarean section      x2  . Cataract extraction w/phaco Left 11/22/2013    Procedure: CATARACT EXTRACTION PHACO AND INTRAOCULAR LENS PLACEMENT (IOC);  Surgeon: Susa Simmondsarroll F Haines, MD;  Location: [facility];  Service: Ophthalmology;  Laterality: Left;  CDE: 3.95  . Colonoscopy      SLF 2010: sigmoid diverticula, otherwise wnl. Repeat 5 yrs.    Current Outpatient Prescriptions  Medication Sig Dispense Refill  . chlorpheniramine (CHLOR-TRIMETON) 4 MG tablet Take 8 mg by mouth at bedtime.    . cromolyn (NASALCROM) 5.2 MG/ACT nasal spray Place 1 spray into the nose daily as needed for allergies.    . fluticasone (FLONASE) 50 MCG/ACT nasal spray Place into both nostrils daily. As needed     No current facility-administered medications for this visit.    Allergies as of 04/24/2015  .  (No Known Allergies)    Family History  Problem Relation Age of Onset  . Colon cancer Mother 7170  . Colon cancer Father 4275    History   Social History  . Marital Status: Married    Spouse Name: N/A  . Number of Children: N/A  . Years of Education: N/A   Occupational History  . Not on file.   Social History Main Topics  . Smoking status: Never Smoker   . Smokeless tobacco: Never Used     Comment: Never smoked  . Alcohol Use: No  . Drug Use: No  . Sexual Activity: Not on file   Other Topics Concern  . Not on file   Social History Narrative    Review of Systems: General: Negative for anorexia, weight loss, fever, chills, fatigue, weakness. Eyes: Negative for vision changes.  ENT: Negative for hoarseness, difficulty swallowing. CV: Negative for chest pain, angina, palpitations, peripheral edema.  Respiratory: Negative for dyspnea at rest, cough, wheezing.  GI: See history of present illness. MS: Negative for joint pain, low back pain.  Derm: Negative for rash or itching.  Neuro: Negative for weakness, memory loss, confusion.  Psych: Negative for anxiety, depression.  Endo: Negative for unusual weight change.  Heme: Negative for bruising or bleeding. Allergy: Negative for rash or hives.    Physical Exam: BP 144/86 mmHg  Pulse 84  Temp(Src) 97.1 F (36.2 C) (Oral)  Ht 5\' 8"  (1.727 m)  Wt 191 lb 3.2 oz (86.728 kg)  BMI 29.08 kg/m2 General:   Alert and oriented. Pleasant and cooperative. Well-nourished and well-developed.  Head:  Normocephalic and atraumatic. Eyes:  Without icterus, sclera clear and conjunctiva pink.  Ears:  Normal auditory acuity. Mouth:  No deformity or lesions, oral mucosa pink. No OP edema. Neck:  Supple, without mass or thyromegaly. Lungs:  Clear to auscultation bilaterally. No wheezes, rales, or rhonchi. No distress.  Heart:  S1, S2 present without murmurs appreciated.  Abdomen:  +BS, soft, non-tender and non-distended. No HSM noted. No  guarding or rebound. No masses appreciated.  Rectal:  Deferred  Msk:  Symmetrical without gross deformities. Normal posture. Pulses:  Normal PT pulses noted. Extremities:  Without clubbing or edema. Neurologic:  Alert and  oriented x4;  grossly normal neurologically. Skin:  Intact without significant lesions or rashes. Cervical Nodes:  No significant cervical adenopathy.  Psych:  Alert and cooperative. Normal mood and affect.     04/24/2015 2:10 PM

## 2015-04-26 NOTE — Progress Notes (Signed)
cc'ed to pcp °

## 2015-05-18 ENCOUNTER — Ambulatory Visit (HOSPITAL_COMMUNITY)
Admission: RE | Admit: 2015-05-18 | Payer: Commercial Managed Care - HMO | Source: Ambulatory Visit | Admitting: Gastroenterology

## 2015-05-18 ENCOUNTER — Encounter (HOSPITAL_COMMUNITY): Admission: RE | Payer: Self-pay | Source: Ambulatory Visit

## 2015-05-18 SURGERY — COLONOSCOPY
Anesthesia: Moderate Sedation

## 2015-05-18 NOTE — OR Nursing (Signed)
Patient called and said that she got her dates mixed up and had not drank her prep for colonoscopy today. Instructed patient that I would notify the office and have them call her to reschedule. Ginger at Dr. Darrick Penna office notified of the above.

## 2015-12-07 DIAGNOSIS — Z23 Encounter for immunization: Secondary | ICD-10-CM | POA: Diagnosis not present

## 2016-04-02 ENCOUNTER — Ambulatory Visit: Payer: Self-pay | Admitting: Nurse Practitioner

## 2016-09-02 DIAGNOSIS — Z23 Encounter for immunization: Secondary | ICD-10-CM | POA: Diagnosis not present

## 2017-07-22 DIAGNOSIS — Z6830 Body mass index (BMI) 30.0-30.9, adult: Secondary | ICD-10-CM | POA: Diagnosis not present

## 2017-07-22 DIAGNOSIS — L308 Other specified dermatitis: Secondary | ICD-10-CM | POA: Diagnosis not present

## 2017-08-29 DIAGNOSIS — I10 Essential (primary) hypertension: Secondary | ICD-10-CM | POA: Diagnosis not present

## 2017-09-02 DIAGNOSIS — E782 Mixed hyperlipidemia: Secondary | ICD-10-CM | POA: Diagnosis not present

## 2017-09-02 DIAGNOSIS — Z6830 Body mass index (BMI) 30.0-30.9, adult: Secondary | ICD-10-CM | POA: Diagnosis not present

## 2017-09-02 DIAGNOSIS — R7301 Impaired fasting glucose: Secondary | ICD-10-CM | POA: Diagnosis not present

## 2017-09-02 DIAGNOSIS — Z23 Encounter for immunization: Secondary | ICD-10-CM | POA: Diagnosis not present

## 2017-09-02 DIAGNOSIS — R5383 Other fatigue: Secondary | ICD-10-CM | POA: Diagnosis not present

## 2017-09-02 DIAGNOSIS — R944 Abnormal results of kidney function studies: Secondary | ICD-10-CM | POA: Diagnosis not present

## 2017-11-25 ENCOUNTER — Emergency Department (HOSPITAL_COMMUNITY): Payer: Medicare HMO

## 2017-11-25 ENCOUNTER — Encounter (HOSPITAL_COMMUNITY): Payer: Self-pay | Admitting: Emergency Medicine

## 2017-11-25 ENCOUNTER — Observation Stay (HOSPITAL_COMMUNITY): Payer: Medicare HMO

## 2017-11-25 ENCOUNTER — Observation Stay (HOSPITAL_COMMUNITY)
Admission: EM | Admit: 2017-11-25 | Discharge: 2017-11-27 | Disposition: A | Discharge status: Home / Self Care | Payer: Medicare HMO | Attending: Family Medicine | Admitting: Family Medicine

## 2017-11-25 ENCOUNTER — Other Ambulatory Visit: Payer: Self-pay

## 2017-11-25 DIAGNOSIS — R680 Hypothermia, not associated with low environmental temperature: Principal | ICD-10-CM | POA: Insufficient documentation

## 2017-11-25 DIAGNOSIS — R112 Nausea with vomiting, unspecified: Secondary | ICD-10-CM | POA: Diagnosis present

## 2017-11-25 DIAGNOSIS — Z7982 Long term (current) use of aspirin: Secondary | ICD-10-CM | POA: Diagnosis not present

## 2017-11-25 DIAGNOSIS — T68XXXA Hypothermia, initial encounter: Secondary | ICD-10-CM

## 2017-11-25 DIAGNOSIS — Z79899 Other long term (current) drug therapy: Secondary | ICD-10-CM | POA: Insufficient documentation

## 2017-11-25 DIAGNOSIS — R1110 Vomiting, unspecified: Secondary | ICD-10-CM | POA: Diagnosis not present

## 2017-11-25 DIAGNOSIS — J029 Acute pharyngitis, unspecified: Secondary | ICD-10-CM | POA: Insufficient documentation

## 2017-11-25 LAB — COMPREHENSIVE METABOLIC PANEL
ALK PHOS: 61 U/L (ref 38–126)
ALT: 23 U/L (ref 14–54)
AST: 25 U/L (ref 15–41)
Albumin: 4.1 g/dL (ref 3.5–5.0)
Anion gap: 9 (ref 5–15)
BILIRUBIN TOTAL: 0.6 mg/dL (ref 0.3–1.2)
BUN: 22 mg/dL — ABNORMAL HIGH (ref 6–20)
CALCIUM: 9.4 mg/dL (ref 8.9–10.3)
CO2: 26 mmol/L (ref 22–32)
CREATININE: 0.87 mg/dL (ref 0.44–1.00)
Chloride: 106 mmol/L (ref 101–111)
GFR calc Af Amer: >60 mL/min (ref >60)
GFR calc non Af Amer: >60 mL/min (ref >60)
GLUCOSE: 178 mg/dL — AB (ref 65–99)
Potassium: 3.5 mmol/L (ref 3.5–5.1)
Sodium: 141 mmol/L (ref 135–145)
TOTAL PROTEIN: 6.8 g/dL (ref 6.5–8.1)

## 2017-11-25 LAB — CBC
HCT: 42.5 % (ref 36.0–46.0)
HEMOGLOBIN: 14 g/dL (ref 12.0–15.0)
MCH: 28.9 pg (ref 26.0–34.0)
MCHC: 32.9 g/dL (ref 30.0–36.0)
MCV: 87.8 fL (ref 78.0–100.0)
PLATELETS: 178 10*3/uL (ref 150–400)
RBC: 4.84 MIL/uL (ref 3.87–5.11)
RDW: 13.6 % (ref 11.5–15.5)
WBC: 8.3 10*3/uL (ref 4.0–10.5)

## 2017-11-25 LAB — URINALYSIS, ROUTINE W REFLEX MICROSCOPIC
BILIRUBIN URINE: NEGATIVE
Glucose, UA: NEGATIVE mg/dL
HGB URINE DIPSTICK: NEGATIVE
Ketones, ur: NEGATIVE mg/dL
Leukocytes, UA: NEGATIVE
NITRITE: NEGATIVE
Protein, ur: NEGATIVE mg/dL
Specific Gravity, Urine: 1.008 (ref 1.005–1.030)
pH: 7 (ref 5.0–8.0)

## 2017-11-25 LAB — INFLUENZA PANEL BY PCR (TYPE A & B)
INFLAPCR: NEGATIVE
INFLBPCR: NEGATIVE

## 2017-11-25 LAB — RAPID STREP SCREEN (MED CTR MEBANE ONLY): STREPTOCOCCUS, GROUP A SCREEN (DIRECT): NEGATIVE

## 2017-11-25 LAB — I-STAT CG4 LACTIC ACID, ED: LACTIC ACID, VENOUS: 1.32 mmol/L (ref 0.5–1.9)

## 2017-11-25 LAB — MONONUCLEOSIS SCREEN: Mono Screen: NEGATIVE

## 2017-11-25 LAB — LIPASE, BLOOD: Lipase: 32 U/L (ref 11–51)

## 2017-11-25 LAB — LACTIC ACID, PLASMA: Lactic Acid, Venous: 1.3 mmol/L (ref 0.5–1.9)

## 2017-11-25 MED ORDER — SODIUM CHLORIDE 0.9 % IV BOLUS (SEPSIS)
1000.0000 mL | Freq: Once | INTRAVENOUS | Status: AC
  Administered 2017-11-25: 1000 mL via INTRAVENOUS

## 2017-11-25 MED ORDER — KCL IN DEXTROSE-NACL 20-5-0.45 MEQ/L-%-% IV SOLN
INTRAVENOUS | Status: AC
  Administered 2017-11-26: 10:00:00 via INTRAVENOUS
  Administered 2017-11-26: 100 mL/h via INTRAVENOUS
  Filled 2017-11-25 (×2): qty 1000

## 2017-11-25 MED ORDER — SODIUM CHLORIDE 0.9 % IV SOLN
Freq: Once | INTRAVENOUS | Status: AC
  Administered 2017-11-25: 20:00:00 via INTRAVENOUS

## 2017-11-25 MED ORDER — SODIUM CHLORIDE 0.9% FLUSH
3.0000 mL | Freq: Two times a day (BID) | INTRAVENOUS | Status: DC
  Administered 2017-11-26: 3 mL via INTRAVENOUS

## 2017-11-25 MED ORDER — ONDANSETRON HCL 4 MG/2ML IJ SOLN
4.0000 mg | Freq: Four times a day (QID) | INTRAMUSCULAR | Status: DC | PRN
  Administered 2017-11-26: 4 mg via INTRAVENOUS
  Filled 2017-11-25: qty 2

## 2017-11-25 MED ORDER — ACETAMINOPHEN 325 MG PO TABS: 650.0000 mg | ORAL_TABLET | Freq: Four times a day (QID) | ORAL | Status: DC | PRN

## 2017-11-25 MED ORDER — ONDANSETRON HCL 4 MG PO TABS: 4.0000 mg | ORAL_TABLET | Freq: Four times a day (QID) | ORAL | Status: DC | PRN

## 2017-11-25 MED ORDER — ONDANSETRON HCL 4 MG/2ML IJ SOLN
INTRAMUSCULAR | Status: AC
  Administered 2017-11-25: 4 mg via INTRAVENOUS
  Filled 2017-11-25: qty 2

## 2017-11-25 MED ORDER — ACETAMINOPHEN 650 MG RE SUPP: 650.0000 mg | Freq: Four times a day (QID) | RECTAL | Status: DC | PRN

## 2017-11-25 MED ORDER — ONDANSETRON HCL 4 MG/2ML IJ SOLN
4.0000 mg | Freq: Once | INTRAMUSCULAR | Status: AC
  Administered 2017-11-25: 4 mg via INTRAVENOUS

## 2017-11-25 MED ORDER — ONDANSETRON HCL 4 MG/2ML IJ SOLN
4.0000 mg | Freq: Once | INTRAMUSCULAR | Status: AC
  Administered 2017-11-25: 4 mg via INTRAVENOUS
  Filled 2017-11-25: qty 2

## 2017-11-25 MED ORDER — ASPIRIN EC 81 MG PO TBEC
81.0000 mg | DELAYED_RELEASE_TABLET | Freq: Every day | ORAL | Status: DC
  Administered 2017-11-26 (×2): 81 mg via ORAL
  Filled 2017-11-25 (×2): qty 1

## 2017-11-25 NOTE — ED Triage Notes (Signed)
PT states she has been nauseated with constant vomiting since 1000 today. PT denies any diarrhea or abdominal pain. PT denies any urinary symptoms.

## 2017-11-25 NOTE — H&P (Signed)
[facility] History and Physical  Tiffany Everett Dimattia ZOX:[id number] DOB: [birth year] DOA: 11/25/2017  Referring physician:  PCP: Benita StabileHall, John Z, MD   Chief Complaint: "I threw up continuously."  HPI: Tiffany Everett Rufus is a 76 y.o. female  With no significant past medical history states she woke up this morning very nauseated. Patient then vomited she states for at least an hour straight. Nonstop. NBNB. No sick contacts. No suspicious ingestions.  ED course: Given IV fluids. Given Zofran. Placed on bear hugger. Hospitalist consulted for admission.   Review of Systems:  As per HPI otherwise 10 point review of systems negative.    Past Medical History:  Diagnosis Date  . Colon polyps   . Seasonal allergies    Past Surgical History:  Procedure Laterality Date  . APPENDECTOMY    . CATARACT EXTRACTION W/PHACO Right 09/13/2013   Procedure: CATARACT EXTRACTION PHACO AND INTRAOCULAR LENS PLACEMENT (IOC);  Surgeon: Susa Simmondsarroll F Haines, MD;  Location: [facility];  Service: Ophthalmology;  Laterality: Right;  CDE:3.25  . CATARACT EXTRACTION W/PHACO Left 11/22/2013   Procedure: CATARACT EXTRACTION PHACO AND INTRAOCULAR LENS PLACEMENT (IOC);  Surgeon: Susa Simmondsarroll F Haines, MD;  Location: [facility];  Service: Ophthalmology;  Laterality: Left;  CDE: 3.95  . CESAREAN SECTION     x2  . COLONOSCOPY     SLF 2010: sigmoid diverticula, otherwise wnl. Repeat 5 yrs.  . I&D EXTREMITY Left    lateral wrist  . LAPAROTOMY     oopherectomy  . ORIF HUMERUS FRACTURE Left    Social History:  reports that  has never smoked. she has never used smokeless tobacco. She reports that she does not drink alcohol or use drugs.  No Known Allergies  Family History  Problem Relation Age of Onset  . Colon cancer Mother 4270  . Colon cancer Father 1075     Prior to Admission medications   Medication Sig Start Date End Date Taking? Authorizing Provider  aspirin EC 81 MG tablet Take 81 mg by mouth at bedtime.   Yes [provider]  chlorpheniramine (CHLOR-TRIMETON) 4 MG tablet Take 8 mg by mouth at bedtime.   Yes [provider]  Melatonin 3 MG CAPS Take 3 mg by mouth at bedtime.   Yes [provider]   Physical Exam: Vitals:   11/25/17 1930 11/25/17 1931 11/25/17 1944 11/25/17 2000  BP: (!) 169/70 (!) 169/70  (!) 168/78  Pulse: 76 76  76  Resp:  18  12  Temp:   97.9 F (36.6 C)   TempSrc:   Rectal   SpO2: 95% 94%  96%  Weight:      Height:        Wt Readings from Last 3 Encounters:  11/25/17 86.2 kg (190 lb)  04/24/15 86.7 kg (191 lb 3.2 oz)  11/22/13 83.9 kg (185 lb)    General:  Appears calm and comfortable; a&Ox3 Eyes:  PERRL, EOMI, normal lids, iris ENT:  grossly normal hearing, lips & tongue Neck:  no LAD, masses or thyromegaly Cardiovascular:  RRR, no m/r/g. No LE edema.  Respiratory:  CTA bilaterally, no w/r/r. Normal respiratory effort. Abdomen:  soft, ntnd Skin:  no rash or induration seen on limited exam Musculoskeletal:  grossly normal tone BUE/BLE Psychiatric:  grossly normal mood and affect, speech fluent and appropriate Neurologic:  CN 2-12 grossly intact, moves all extremities in coordinated fashion.          Labs on Admission:  Basic Metabolic Panel: Recent  Labs  Lab 11/25/17 1551  NA 141  K 3.5  CL 106  CO2 26  GLUCOSE 178*  BUN 22*  CREATININE 0.87  CALCIUM 9.4   Liver Function Tests: Recent Labs  Lab 11/25/17 1551  AST 25  ALT 23  ALKPHOS 61  BILITOT 0.6  PROT 6.8  ALBUMIN 4.1   Recent Labs  Lab 11/25/17 1551  LIPASE 32   No results for input(Everett): AMMONIA in the last 168 hours. CBC: Recent Labs  Lab 11/25/17 1551  WBC 8.3  HGB 14.0  HCT 42.5  MCV 87.8  PLT 178   Cardiac Enzymes: No results for input(Everett): CKTOTAL, CKMB, CKMBINDEX, TROPONINI in the last 168 hours.  BNP (last 3 results) No results for input(Everett): BNP in the last 8760 hours.  ProBNP (last 3 results) No results for input(Everett): PROBNP in the last 8760  hours.   Serum creatinine: 0.87 mg/dL 45/40/9812/18/18 11911551 Estimated creatinine clearance: 56.3 mL/min  CBG: No results for input(Everett): GLUCAP in the last 168 hours.  Radiological Exams on Admission: Dg Chest Portable 1 View  Result Date: 11/25/2017 CLINICAL DATA:  Nausea and vomiting since this morning. EXAM: PORTABLE CHEST 1 VIEW COMPARISON:  None. FINDINGS: The cardiomediastinal silhouette is normal in size. Normal pulmonary vascularity. Atherosclerotic calcification of the aortic arch. No focal consolidation, pleural effusion, or pneumothorax. No acute osseous abnormality. IMPRESSION: 1.  No active cardiopulmonary disease. 2.  Aortic atherosclerosis (ICD10-I70.0). Electronically Signed   By: Obie DredgeWilliam T Derry M.D.   On: 11/25/2017 16:12    EKG: pending  Assessment/Plan Principal Problem:   Hypothermia Active Problems:   N&V (nausea and vomiting)   Hypothermia Scheduled temperature checks Other cause Flu negative No further signs of infection  Nausea and vomiting When necessary Zofran IV fluids Check 2 view x-ray abdomen   Code Status: FC DVT Prophylaxis: SCDS Family Communication: pt to notify Disposition Plan: Pending Improvement  Status: obs, tele  Haydee SalterPhillip M Miaa Latterell, MD Family Medicine [facility] [email or web address] Password [email or web address]

## 2017-11-25 NOTE — ED Provider Notes (Signed)
[name]PART[facility] Provider Note   CSN: [id number] Arrival date & time: 11/25/17  1443     History   Chief Complaint Chief Complaint  Patient presents with  . Emesis    HPI Tiffany Everett is a 76 y.o. female.  Level 5 caveat for urgent need for intervention.  Patient reports sore throat with associated nausea and vomiting since early this morning.  Her initial temperature in the emergency department was 93.9 F.  No fever, sweats, chills, meningeal signs, diarrhea, abdominal pain, dysuria.  She is normally healthy.      Past Medical History:  Diagnosis Date  . Colon polyps   . Seasonal allergies     Patient Active Problem List   Diagnosis Date Noted  . History of colonic polyps 04/24/2015    Past Surgical History:  Procedure Laterality Date  . APPENDECTOMY    . CATARACT EXTRACTION W/PHACO Right 09/13/2013   Procedure: CATARACT EXTRACTION PHACO AND INTRAOCULAR LENS PLACEMENT (IOC);  Surgeon: Susa Simmondsarroll F Haines, MD;  Location: [facility];  Service: Ophthalmology;  Laterality: Right;  CDE:3.25  . CATARACT EXTRACTION W/PHACO Left 11/22/2013   Procedure: CATARACT EXTRACTION PHACO AND INTRAOCULAR LENS PLACEMENT (IOC);  Surgeon: Susa Simmondsarroll F Haines, MD;  Location: [facility];  Service: Ophthalmology;  Laterality: Left;  CDE: 3.95  . CESAREAN SECTION     x2  . COLONOSCOPY     SLF 2010: sigmoid diverticula, otherwise wnl. Repeat 5 yrs.  . I&D EXTREMITY Left    lateral wrist  . LAPAROTOMY     oopherectomy  . ORIF HUMERUS FRACTURE Left     OB History    No data available       Home Medications    Prior to Admission medications   Medication Sig Start Date End Date Taking? Authorizing Provider  aspirin EC 81 MG tablet Take 81 mg by mouth at bedtime.   Yes [provider]  chlorpheniramine (CHLOR-TRIMETON) 4 MG tablet Take 8 mg by mouth at bedtime.   Yes [provider]  Melatonin 3 MG CAPS Take 3 mg by mouth at bedtime.   Yes [provider]    Family History Family History  Problem Relation Age of Onset  . Colon cancer Mother 5470  . Colon cancer Father 5175    Social History Social History   Tobacco Use  . Smoking status: Never Smoker  . Smokeless tobacco: Never Used  . Tobacco comment: Never smoked  Substance Use Topics  . Alcohol use: No    Alcohol/week: 0.0 oz  . Drug use: No     Allergies   Patient has no known allergies.   Review of Systems Review of Systems  Unable to perform ROS: Acuity of condition     Physical Exam Updated Vital Signs BP (!) 201/80   Pulse 84   Temp 97.9 F (36.6 C) (Rectal)   Resp 18   Ht 5\' 3"  (1.6 m)   Wt 86.2 kg (190 lb)   SpO2 93%   BMI 33.66 kg/m   Physical Exam  Constitutional: She is oriented to person, place, and time.  Alert, pale, dehydrated, hypothermic  HENT:  Head: Normocephalic and atraumatic.  Eyes: Conjunctivae are normal.  Neck: Neck supple.  Cardiovascular: Normal rate and regular rhythm.  Pulmonary/Chest: Effort normal and breath sounds normal.  Abdominal: Soft. Bowel sounds are normal.  Musculoskeletal: Normal range of motion.  Neurological: She is alert and oriented to person, place, and time.  Skin: Skin  is warm and dry.  Cool to touch.  Psychiatric: She has a normal mood and affect. Her behavior is normal.  Nursing note and vitals reviewed.    ED Treatments / Results  Labs (all labs ordered are listed, but only abnormal results are displayed) Labs Reviewed  COMPREHENSIVE METABOLIC PANEL - Abnormal; Notable for the following components:      Result Value   Glucose, Bld 178 (*)    BUN 22 (*)    All other components within normal limits  URINALYSIS, ROUTINE W REFLEX MICROSCOPIC - Abnormal; Notable for the following components:   Color, Urine STRAW (*)    APPearance HAZY (*)    All other components within normal limits  RAPID STREP SCREEN (NOT [facility])  CULTURE, GROUP A STREP ([facility])  LIPASE, BLOOD  CBC    MONONUCLEOSIS SCREEN    EKG  EKG Interpretation None       Radiology Dg Chest Portable 1 View  Result Date: 11/25/2017 CLINICAL DATA:  Nausea and vomiting since this morning. EXAM: PORTABLE CHEST 1 VIEW COMPARISON:  None. FINDINGS: The cardiomediastinal silhouette is normal in size. Normal pulmonary vascularity. Atherosclerotic calcification of the aortic arch. No focal consolidation, pleural effusion, or pneumothorax. No acute osseous abnormality. IMPRESSION: 1.  No active cardiopulmonary disease. 2.  Aortic atherosclerosis (ICD10-I70.0). Electronically Signed   By: Obie DredgeWilliam T Derry M.D.   On: 11/25/2017 16:12    Procedures Procedures (including critical care time)  Medications Ordered in ED Medications  ondansetron (ZOFRAN) injection 4 mg (4 mg Intravenous Given 11/25/17 1530)  sodium chloride 0.9 % bolus 1,000 mL (0 mLs Intravenous Stopped 11/25/17 1757)  sodium chloride 0.9 % bolus 1,000 mL (0 mLs Intravenous Stopped 11/25/17 1622)     Initial Impression / Assessment and Plan / ED Course  I have reviewed the triage vital signs and the nursing notes.  Pertinent labs & imaging results that were available during my care of the patient were reviewed by me and considered in my medical decision making (see chart for details).     Patient presents with sore throat, persistent nausea and vomiting since earlier today.  She was hypothermic on initial exam.  Bear hugger, warmed IV fluids initiated, vigorous IV hydration.  Will admit to general medicine.   CRITICAL CARE Performed by: Donnetta HutchingOOK,Tashara Suder Total critical care time: 30 minutes Critical care time was exclusive of separately billable procedures and treating other patients. Critical care was necessary to treat or prevent imminent or life-threatening deterioration. Critical care was time spent personally by me on the following activities: development of treatment plan with patient and/or surrogate as well as nursing, discussions  with consultants, evaluation of patient's response to treatment, examination of patient, obtaining history from patient or surrogate, ordering and performing treatments and interventions, ordering and review of laboratory studies, ordering and review of radiographic studies, pulse oximetry and re-evaluation of patient's condition.  Final Clinical Impressions(s) / ED Diagnoses   Final diagnoses:  Hypothermia, initial encounter  Intractable vomiting with nausea, unspecified vomiting type  Pharyngitis, unspecified etiology    ED Discharge Orders    None       Donnetta Hutchingook, Rumi Kolodziej, MD 11/25/17 Barry Brunner1935

## 2017-11-26 ENCOUNTER — Other Ambulatory Visit: Payer: Self-pay

## 2017-11-26 DIAGNOSIS — R112 Nausea with vomiting, unspecified: Secondary | ICD-10-CM | POA: Diagnosis not present

## 2017-11-26 DIAGNOSIS — J029 Acute pharyngitis, unspecified: Secondary | ICD-10-CM

## 2017-11-26 DIAGNOSIS — T68XXXA Hypothermia, initial encounter: Secondary | ICD-10-CM | POA: Diagnosis not present

## 2017-11-26 LAB — BASIC METABOLIC PANEL
Anion gap: 13 (ref 5–15)
Anion gap: 11 (ref 5–15)
Anion gap: 9 (ref 5–15)
BUN: 12 mg/dL (ref 6–20)
BUN: 9 mg/dL (ref 6–20)
BUN: 9 mg/dL (ref 6–20)
CALCIUM: 8.6 mg/dL — AB (ref 8.9–10.3)
CALCIUM: 8.7 mg/dL — AB (ref 8.9–10.3)
CALCIUM: 8.8 mg/dL — AB (ref 8.9–10.3)
CHLORIDE: 107 mmol/L (ref 101–111)
CO2: 23 mmol/L (ref 22–32)
CO2: 23 mmol/L (ref 22–32)
CO2: 26 mmol/L (ref 22–32)
CREATININE: 0.8 mg/dL (ref 0.44–1.00)
CREATININE: 0.82 mg/dL (ref 0.44–1.00)
CREATININE: 0.81 mg/dL (ref 0.44–1.00)
Chloride: 106 mmol/L (ref 101–111)
Chloride: 106 mmol/L (ref 101–111)
GFR calc non Af Amer: >60 mL/min (ref >60)
GLUCOSE: 135 mg/dL — AB (ref 65–99)
GLUCOSE: 114 mg/dL — AB (ref 65–99)
Glucose, Bld: 141 mg/dL — ABNORMAL HIGH (ref 65–99)
Potassium: 3.4 mmol/L — ABNORMAL LOW (ref 3.5–5.1)
Potassium: 3.7 mmol/L (ref 3.5–5.1)
Potassium: 3.2 mmol/L — ABNORMAL LOW (ref 3.5–5.1)
SODIUM: 143 mmol/L (ref 135–145)
Sodium: 140 mmol/L (ref 135–145)
Sodium: 141 mmol/L (ref 135–145)

## 2017-11-26 LAB — CBC
HCT: 43.7 % (ref 36.0–46.0)
Hemoglobin: 14.6 g/dL (ref 12.0–15.0)
MCH: 29.4 pg (ref 26.0–34.0)
MCHC: 33.4 g/dL (ref 30.0–36.0)
MCV: 87.9 fL (ref 78.0–100.0)
PLATELETS: 192 10*3/uL (ref 150–400)
RBC: 4.97 MIL/uL (ref 3.87–5.11)
RDW: 14 % (ref 11.5–15.5)
WBC: 10.1 10*3/uL (ref 4.0–10.5)

## 2017-11-26 LAB — LACTIC ACID, PLASMA: Lactic Acid, Venous: 1.2 mmol/L (ref 0.5–1.9)

## 2017-11-26 LAB — CK
CK TOTAL: 114 U/L (ref 38–234)
Total CK: 142 U/L (ref 38–234)
Total CK: 137 U/L (ref 38–234)

## 2017-11-26 MED ORDER — SODIUM CHLORIDE 0.9 % IV SOLN
INTRAVENOUS | Status: AC
  Administered 2017-11-26: 17:00:00 via INTRAVENOUS

## 2017-11-26 MED ORDER — HYDRALAZINE HCL 25 MG PO TABS
25.0000 mg | ORAL_TABLET | Freq: Four times a day (QID) | ORAL | Status: DC | PRN
  Administered 2017-11-26: 25 mg via ORAL
  Filled 2017-11-26: qty 1

## 2017-11-26 NOTE — Care Management Obs Status (Signed)
MEDICARE OBSERVATION STATUS NOTIFICATION   Patient Details  Name: Tiffany Everett Regula MRN: [id number] Date of Birth: 03/04/1941   Medicare Observation Status Notification Given:  Yes    Malcolm MetroChildress, Kaleigha Chamberlin Demske, RN 11/26/2017, 8:43 AM

## 2017-11-26 NOTE — Progress Notes (Signed)
PROGRESS NOTE    Tiffany Everett  HQI:[id number] DOB: January 29, 1941 DOA: 11/25/2017 PCP: Benita StabileHall, John Z, MD    Brief Narrative:  Tiffany Everett is a 76 y.o. female  With no significant past medical history states she woke up this morning very nauseated. Patient then vomited she states for at least an hour straight. Nonstop. NBNB. No sick contacts. No suspicious ingestions.  ED course: Given IV fluids. Given Zofran. Placed on bear hugger. Hospitalist consulted for admission.  Assessment & Plan:   Principal Problem:   Hypothermia Active Problems:   N&V (nausea and vomiting)   Hypothermia -Continue to monitor on telemetry -Repeat BMP in p.m. to monitor for signs of hyperkalemia, hypoglycemia - Patient states she is having good urine output -Creatinine kinase is pending -Serial temperatures -Patient has been rewarmed -No signs of coagulopathy at this time -Blood cultures pending  Nausea and vomiting - IV Zofran as needed -Abdominal x-rays showing no explanation for patient's symptoms  DVT prophylaxis: SCDs Code Status: Full code Family Communication: husband is bedside Disposition Plan: may be able to discharge in am if no electrolyte abnormalities or arrhythmias   Consultants:   None  Procedures:   None  Antimicrobials:   None    Subjective: Patient reports she had one episode of emesis this morning prior to breakfast and then did not want to eat breakfast.  She states that Zofran helped her with her nausea.  Denies any muscle aches.  States she is having significant urinary output.  Objective: Vitals:   11/26/17 0105 11/26/17 0205 11/26/17 0436 11/26/17 0757  BP: (!) 179/76 (!) 179/76 (!) 188/66   Pulse: 72 72    Resp: 18 18 20    Temp: 98.3 F (36.8 C) 98.3 F (36.8 C) 98.6 F (37 C)   TempSrc: Oral  Oral   SpO2: 97% 97% 100% 92%  Weight:   88.2 kg (194 lb 8 oz)   Height:        Intake/Output Summary (Last 24 hours) at 11/26/2017 1124 Last data  filed at 11/26/2017 1001 Gross per 24 hour  Intake 4106.67 ml  Output 200 ml  Net 3906.67 ml   Filed Weights   11/25/17 1502 11/26/17 0436  Weight: 86.2 kg (190 lb) 88.2 kg (194 lb 8 oz)    Examination:  General exam: Appears calm and comfortable  Respiratory system: Clear to auscultation. Respiratory effort normal. Cardiovascular system: S1 & S2 heard, RRR. No JVD, murmurs, rubs, gallops or clicks. No pedal edema. Gastrointestinal system: Abdomen is nondistended, soft and nontender. No organomegaly or masses felt. Normal bowel sounds heard. Central nervous system: Alert and oriented. No focal neurological deficits. Extremities: Symmetric 5 x 5 power. Skin: No rashes, lesions or ulcers Psychiatry: Judgement and insight appear normal. Mood & affect appropriate.     Data Reviewed: I have personally reviewed following labs and imaging studies  CBC: Recent Labs  Lab 11/25/17 1551 11/26/17 0358  WBC 8.3 10.1  HGB 14.0 14.6  HCT 42.5 43.7  MCV 87.8 87.9  PLT 178 192   Basic Metabolic Panel: Recent Labs  Lab 11/25/17 1551 11/26/17 0358  NA 141 143  K 3.5 3.4*  CL 106 107  CO2 26 23  GLUCOSE 178* 141*  BUN 22* 12  CREATININE 0.87 0.80  CALCIUM 9.4 8.6*   GFR: Estimated Creatinine Clearance: 62 mL/min (by C-G formula based on SCr of 0.8 mg/dL). Liver Function Tests: Recent Labs  Lab 11/25/17 1551  AST 25  ALT  23  ALKPHOS 61  BILITOT 0.6  PROT 6.8  ALBUMIN 4.1   Recent Labs  Lab 11/25/17 1551  LIPASE 32   No results for input(s): AMMONIA in the last 168 hours. Coagulation Profile: No results for input(s): INR, PROTIME in the last 168 hours. Cardiac Enzymes: Recent Labs  Lab 11/26/17 0358  CKTOTAL 114   BNP (last 3 results) No results for input(s): PROBNP in the last 8760 hours. HbA1C: No results for input(s): HGBA1C in the last 72 hours. CBG: No results for input(s): GLUCAP in the last 168 hours. Lipid Profile: No results for input(s): CHOL,  HDL, LDLCALC, TRIG, CHOLHDL, LDLDIRECT in the last 72 hours. Thyroid Function Tests: No results for input(s): TSH, T4TOTAL, FREET4, T3FREE, THYROIDAB in the last 72 hours. Anemia Panel: No results for input(s): VITAMINB12, FOLATE, FERRITIN, TIBC, IRON, RETICCTPCT in the last 72 hours. Sepsis Labs: Recent Labs  Lab 11/25/17 2025 11/25/17 2051 11/25/17 2307  LATICACIDVEN 1.3 1.32 1.2    Recent Results (from the past 240 hour(s))  Rapid strep screen     Status: None   Collection Time: 11/25/17  3:47 PM  Result Value Ref Range Status   Streptococcus, Group A Screen (Direct) NEGATIVE NEGATIVE Final    Comment: (NOTE) A Rapid Antigen test may result negative if the antigen level in the sample is below the detection level of this test. The FDA has not cleared this test as a stand-alone test therefore the rapid antigen negative result has reflexed to a Group A Strep culture.   Culture, group A strep     Status: None (Preliminary result)   Collection Time: 11/25/17  3:47 PM  Result Value Ref Range Status   Specimen Description THROAT  Final   Special Requests NONE Reflexed from [id number]  Final   Culture   Final    TOO YOUNG TO READ Performed at [facility], [address] [place], [place] [address]    Report Status PENDING  Incomplete  Culture, blood (routine x 2)     Status: None (Preliminary result)   Collection Time: 11/25/17  8:25 PM  Result Value Ref Range Status   Specimen Description BLOOD LEFT ARM  Final   Special Requests   Final    BOTTLES DRAWN AEROBIC AND ANAEROBIC Blood Culture results may not be optimal due to an inadequate volume of blood received in culture bottles   Culture NO GROWTH < 24 HOURS  Final   Report Status PENDING  Incomplete  Culture, blood (routine x 2)     Status: None (Preliminary result)   Collection Time: 11/25/17  8:36 PM  Result Value Ref Range Status   Specimen Description BLOOD RIGHT HAND  Final   Special Requests   Final    BOTTLES  DRAWN AEROBIC AND ANAEROBIC Blood Culture adequate volume   Culture NO GROWTH < 24 HOURS  Final   Report Status PENDING  Incomplete         Radiology Studies: Dg Chest Portable 1 View  Result Date: 11/25/2017 CLINICAL DATA:  Nausea and vomiting since this morning. EXAM: PORTABLE CHEST 1 VIEW COMPARISON:  None. FINDINGS: The cardiomediastinal silhouette is normal in size. Normal pulmonary vascularity. Atherosclerotic calcification of the aortic arch. No focal consolidation, pleural effusion, or pneumothorax. No acute osseous abnormality. IMPRESSION: 1.  No active cardiopulmonary disease. 2.  Aortic atherosclerosis (ICD10-I70.0). Electronically Signed   By: Obie Dredge M.D.   On: 11/25/2017 16:12   Dg Abd 2 Views  Result Date: 11/25/2017 CLINICAL DATA:  Vomiting EXAM: ABDOMEN - 2 VIEW COMPARISON:  CT abdomen and pelvis September 28, 2010 FINDINGS: Supine and upright images were obtained. There is moderate stool in the colon. There is no bowel dilatation or air-fluid level to suggest bowel obstruction. No free air. There are phleboliths in the pelvis. Visualized lung bases are clear. IMPRESSION: No bowel obstruction or free air evident.  Moderate stool in colon. Electronically Signed   By: Bretta BangWilliam  Woodruff III M.D.   On: 11/25/2017 22:34        Scheduled Meds: . aspirin EC  81 mg Oral QHS  . sodium chloride flush  3 mL Intravenous Q12H   Continuous Infusions: . dextrose 5 % and 0.45 % NaCl with KCl 20 mEq/L 100 mL/hr at 11/26/17 0953     LOS: 0 days    Time spent: 30 minutes    Katrinka BlazingAlex U Kadolph, MD [facility] Pager [phone]  If 7PM-7AM, please contact night-coverage [email or web address] Password [email or web address] 11/26/2017, 11:24 AM

## 2017-11-26 NOTE — Plan of Care (Signed)
Pt admitted to floor from ED; oriented to room; made comfortable; call bell within reach-monitoring ongoing.AW

## 2017-11-26 NOTE — Progress Notes (Signed)
Dr. Rinaldo RatelKadolph paged and made aware of pt's current BP 176/82 and pulse 74. Will continue to monitor.

## 2017-11-27 ENCOUNTER — Observation Stay (HOSPITAL_BASED_OUTPATIENT_CLINIC_OR_DEPARTMENT_OTHER): Payer: Medicare HMO

## 2017-11-27 DIAGNOSIS — I5030 Unspecified diastolic (congestive) heart failure: Secondary | ICD-10-CM

## 2017-11-27 DIAGNOSIS — T68XXXA Hypothermia, initial encounter: Secondary | ICD-10-CM | POA: Diagnosis not present

## 2017-11-27 DIAGNOSIS — R112 Nausea with vomiting, unspecified: Secondary | ICD-10-CM | POA: Diagnosis not present

## 2017-11-27 DIAGNOSIS — J029 Acute pharyngitis, unspecified: Secondary | ICD-10-CM | POA: Diagnosis not present

## 2017-11-27 LAB — BASIC METABOLIC PANEL
ANION GAP: 10 (ref 5–15)
BUN: 9 mg/dL (ref 6–20)
CALCIUM: 8.7 mg/dL — AB (ref 8.9–10.3)
CO2: 23 mmol/L (ref 22–32)
Chloride: 108 mmol/L (ref 101–111)
Creatinine, Ser: 0.88 mg/dL (ref 0.44–1.00)
Glucose, Bld: 103 mg/dL — ABNORMAL HIGH (ref 65–99)
POTASSIUM: 3.2 mmol/L — AB (ref 3.5–5.1)
Sodium: 141 mmol/L (ref 135–145)

## 2017-11-27 LAB — ECHOCARDIOGRAM COMPLETE
HEIGHTINCHES: 63 in
WEIGHTICAEL: 3092.8 [oz_av]

## 2017-11-27 LAB — CK: CK TOTAL: 135 U/L (ref 38–234)

## 2017-11-27 MED ORDER — POTASSIUM CHLORIDE CRYS ER 20 MEQ PO TBCR: 20.0000 meq | EXTENDED_RELEASE_TABLET | Freq: Every day | ORAL | 0 refills | Status: DC

## 2017-11-27 MED ORDER — POTASSIUM CHLORIDE CRYS ER 20 MEQ PO TBCR: 40.0000 meq | EXTENDED_RELEASE_TABLET | Freq: Two times a day (BID) | ORAL | 0 refills | Status: DC

## 2017-11-27 MED ORDER — POTASSIUM CHLORIDE CRYS ER 20 MEQ PO TBCR
40.0000 meq | EXTENDED_RELEASE_TABLET | Freq: Two times a day (BID) | ORAL | Status: DC
  Administered 2017-11-27: 40 meq via ORAL
  Filled 2017-11-27: qty 2

## 2017-11-27 MED ORDER — LISINOPRIL 10 MG PO TABS
10.0000 mg | ORAL_TABLET | Freq: Every day | ORAL | Status: DC
  Administered 2017-11-27: 10 mg via ORAL
  Filled 2017-11-27: qty 1

## 2017-11-27 MED ORDER — LISINOPRIL 10 MG PO TABS: 10.0000 mg | ORAL_TABLET | Freq: Every day | ORAL | 0 refills | Status: DC

## 2017-11-27 NOTE — Discharge Summary (Signed)
Physician Discharge Summary  Tiffany Everett:[id number] DOB: Apr 29, 1941 DOA: 11/25/2017  PCP: Benita Stabile, MD  Admit date: 11/25/2017 Discharge date: 11/27/2017  Admitted From: Home Disposition: Home  Recommendations for Outpatient Follow-up:  1. Follow up with PCP in 1-2 weeks 2. start with half a tablet of lisinopril daily 3. Monitor blood pressure twice a day until primary care follow-up 4. Please obtain BMP/CBC in one week   Home Health: None Equipment/Devices: None  Discharge Condition: Stable CODE STATUS: Full code Diet recommendation: Regular diet  Brief/Interim Summary: Tiffany Mogle Dilleryis a 76 y.o.femaleWith no significant past medical history states she woke up this morning very nauseated. Patient then vomited she states for at least an hour straight. Nonstop. NBNB. No sick contacts. No suspicious ingestions.  ED course: Given IV fluids. Given Zofran. Placed on bear hugger. Hospitalist consulted for admission.  On time of evaluation on 11/26/2017 patient's body temperature was back to normal range.  Numerous causes for hypothermia were ruled out including medication overdose, hypoglycemia, hypothyroid, cardiac, infection, and neurologic.  Likely due to large volume loss given 2 hours of continuous vomiting prior to admission. Laboratory data showing only slight hypokalemia.  Telemetry showing PVCs.  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  Echocardiogram ordered on 11/27/2017.  Results below.  Patient was stable for discharge.  During hospitalization patient's blood pressure was elevated and she was started on 10 mg of lisinopril daily  Discharge Diagnoses:  Principal Problem:   Hypothermia Active Problems:   N&V (nausea and vomiting)    Discharge Instructions  Discharge Instructions    Call MD for:  difficulty breathing, headache or visual disturbances   Complete by:  As directed    Call MD for:  extreme fatigue    Complete by:  As directed    Call MD for:  hives   Complete by:  As directed    Call MD for:  persistant dizziness or light-headedness   Complete by:  As directed    Call MD for:  persistant nausea and vomiting   Complete by:  As directed    Call MD for:  severe uncontrolled pain   Complete by:  As directed    Call MD for:  temperature >100.4   Complete by:  As directed    Discharge instructions   Complete by:  As directed    Follow up with PCP within the next 2 weeks   Increase activity slowly   Complete by:  As directed      Allergies as of 11/27/2017   No Known Allergies     Medication List    STOP taking these medications   chlorpheniramine 4 MG tablet Commonly known as:  CHLOR-TRIMETON     TAKE these medications   aspirin EC 81 MG tablet Take 81 mg by mouth at bedtime.   lisinopril 10 MG tablet Commonly known as:  PRINIVIL,ZESTRIL Take 1 tablet (10 mg total) by mouth daily. Start taking on:  11/28/2017   Melatonin 3 MG Caps Take 3 mg by mouth at bedtime.   potassium chloride SA 20 MEQ tablet Commonly known as:  K-DUR,KLOR-CON Take 2 tablets (40 mEq total) by mouth 2 (two) times daily.      Follow-up Information    Benita Stabile, MD. Schedule an appointment as soon as possible for a visit in 2 week(s).   Specialty:  Internal Medicine Contact information: [place] [address]  No Known Allergies  Consultations:  None   Procedures/Studies: Dg Chest Portable 1 View  Result Date: 11/25/2017 CLINICAL DATA:  Nausea and vomiting since this morning. EXAM: PORTABLE CHEST 1 VIEW COMPARISON:  None. FINDINGS: The cardiomediastinal silhouette is normal in size. Normal pulmonary vascularity. Atherosclerotic calcification of the aortic arch. No focal consolidation, pleural effusion, or pneumothorax. No acute osseous abnormality. IMPRESSION: 1.  No active cardiopulmonary disease. 2.  Aortic atherosclerosis (ICD10-I70.0). Electronically Signed   By:  Obie DredgeWilliam T Derry M.D.   On: 11/25/2017 16:12   Dg Abd 2 Views  Result Date: 11/25/2017 CLINICAL DATA:  Vomiting EXAM: ABDOMEN - 2 VIEW COMPARISON:  CT abdomen and pelvis September 28, 2010 FINDINGS: Supine and upright images were obtained. There is moderate stool in the colon. There is no bowel dilatation or air-fluid level to suggest bowel obstruction. No free air. There are phleboliths in the pelvis. Visualized lung bases are clear. IMPRESSION: No bowel obstruction or free air evident.  Moderate stool in colon. Electronically Signed   By: Bretta BangWilliam  Woodruff III M.D.   On: 11/25/2017 22:34   Echo: Mild LVH with LVEF 55-60% and grade 1 diastolic dysfunction. Mild   left atrial enlargement. Trivial mitral regurgitation. Trivial   tricuspid regurgitation.  Subjective: Patient feels well.  She is excited to go home today as she would like to start preparations for Christmas.  Discharge Exam: Vitals:   11/27/17 0657 11/27/17 1538  BP: (!) 179/80 (!) 167/83  Pulse: 69 72  Resp: 18 18  Temp: 98.6 F (37 C) 98.4 F (36.9 C)  SpO2: 97% 97%   Vitals:   11/26/17 2032 11/26/17 2039 11/27/17 0657 11/27/17 1538  BP: (!) 186/74  (!) 179/80 (!) 167/83  Pulse: 63  69 72  Resp: 20  18 18   Temp: 99.1 F (37.3 C)  98.6 F (37 C) 98.4 F (36.9 C)  TempSrc: Oral  Oral Oral  SpO2: 98% 95% 97% 97%  Weight:   87.7 kg (193 lb 4.8 oz)   Height:        General: Pt is alert, awake, not in acute distress Cardiovascular: RRR, S1/S2 +, no rubs, no gallops Respiratory: CTA bilaterally, no wheezing, no rhonchi Abdominal: Soft, NT, ND, bowel sounds + Extremities: no edema, no cyanosis    The results of significant diagnostics from this hospitalization (including imaging, microbiology, ancillary and laboratory) are listed below for reference.     Microbiology: Recent Results (from the past 240 hour(s))  Rapid strep screen     Status: None   Collection Time: 11/25/17  3:47 PM  Result Value Ref Range  Status   Streptococcus, Group A Screen (Direct) NEGATIVE NEGATIVE Final    Comment: (NOTE) A Rapid Antigen test may result negative if the antigen level in the sample is below the detection level of this test. The FDA has not cleared this test as a stand-alone test therefore the rapid antigen negative result has reflexed to a Group A Strep culture.   Culture, group A strep     Status: None (Preliminary result)   Collection Time: 11/25/17  3:47 PM  Result Value Ref Range Status   Specimen Description THROAT  Final   Special Requests NONE Reflexed from [phone] [id number]  Final   Culture   Final    CULTURE REINCUBATED FOR BETTER GROWTH Performed at [facility], [address] [place], [place] [address]    Report Status PENDING  Incomplete  Culture, blood (routine x 2)  Status: None (Preliminary result)   Collection Time: 11/25/17  8:25 PM  Result Value Ref Range Status   Specimen Description BLOOD LEFT ARM  Final   Special Requests   Final    BOTTLES DRAWN AEROBIC AND ANAEROBIC Blood Culture results may not be optimal due to an inadequate volume of blood received in culture bottles   Culture NO GROWTH 2 DAYS  Final   Report Status PENDING  Incomplete  Culture, blood (routine x 2)     Status: None (Preliminary result)   Collection Time: 11/25/17  8:36 PM  Result Value Ref Range Status   Specimen Description BLOOD RIGHT HAND  Final   Special Requests   Final    BOTTLES DRAWN AEROBIC AND ANAEROBIC Blood Culture adequate volume   Culture NO GROWTH 2 DAYS  Final   Report Status PENDING  Incomplete     Labs: BNP (last 3 results) No results for input(s): BNP in the last 8760 hours. Basic Metabolic Panel: Recent Labs  Lab 11/25/17 1551 11/26/17 0358 11/26/17 1155 11/26/17 1640 11/27/17 0441  NA 141 143 140 141 141  K 3.5 3.4* 3.7 3.2* 3.2*  CL 106 107 106 106 108  CO2 26 23 23 26 23   GLUCOSE 178* 141* 135* 114* 103*  BUN 22* 12 9 9 9   CREATININE 0.87 0.80 0.82 0.81 0.88   CALCIUM 9.4 8.6* 8.7* 8.8* 8.7*   Liver Function Tests: Recent Labs  Lab 11/25/17 1551  AST 25  ALT 23  ALKPHOS 61  BILITOT 0.6  PROT 6.8  ALBUMIN 4.1   Recent Labs  Lab 11/25/17 1551  LIPASE 32   No results for input(s): AMMONIA in the last 168 hours. CBC: Recent Labs  Lab 11/25/17 1551 11/26/17 0358  WBC 8.3 10.1  HGB 14.0 14.6  HCT 42.5 43.7  MCV 87.8 87.9  PLT 178 192   Cardiac Enzymes: Recent Labs  Lab 11/26/17 0358 11/26/17 1155 11/26/17 1640 11/27/17 0441  CKTOTAL 114 142 137 135   BNP: Invalid input(s): POCBNP CBG: No results for input(s): GLUCAP in the last 168 hours. D-Dimer No results for input(s): DDIMER in the last 72 hours. Hgb A1c No results for input(s): HGBA1C in the last 72 hours. Lipid Profile No results for input(s): CHOL, HDL, LDLCALC, TRIG, CHOLHDL, LDLDIRECT in the last 72 hours. Thyroid function studies No results for input(s): TSH, T4TOTAL, T3FREE, THYROIDAB in the last 72 hours.  Invalid input(s): FREET3 Anemia work up No results for input(s): VITAMINB12, FOLATE, FERRITIN, TIBC, IRON, RETICCTPCT in the last 72 hours. Urinalysis    Component Value Date/Time   COLORURINE STRAW (A) 11/25/2017 1506   APPEARANCEUR HAZY (A) 11/25/2017 1506   LABSPEC 1.008 11/25/2017 1506   PHURINE 7.0 11/25/2017 1506   GLUCOSEU NEGATIVE 11/25/2017 1506   HGBUR NEGATIVE 11/25/2017 1506   BILIRUBINUR NEGATIVE 11/25/2017 1506   KETONESUR NEGATIVE 11/25/2017 1506   PROTEINUR NEGATIVE 11/25/2017 1506   UROBILINOGEN 0.2 06/06/2009 1611   NITRITE NEGATIVE 11/25/2017 1506   LEUKOCYTESUR NEGATIVE 11/25/2017 1506   Sepsis Labs Invalid input(s): PROCALCITONIN,  WBC,  LACTICIDVEN Microbiology Recent Results (from the past 240 hour(s))  Rapid strep screen     Status: None   Collection Time: 11/25/17  3:47 PM  Result Value Ref Range Status   Streptococcus, Group A Screen (Direct) NEGATIVE NEGATIVE Final    Comment: (NOTE) A Rapid Antigen test may  result negative if the antigen level in the sample is below the detection level of this  test. The FDA has not cleared this test as a stand-alone test therefore the rapid antigen negative result has reflexed to a Group A Strep culture.   Culture, group A strep     Status: None (Preliminary result)   Collection Time: 11/25/17  3:47 PM  Result Value Ref Range Status   Specimen Description THROAT  Final   Special Requests NONE Reflexed from [id number]  Final   Culture   Final    CULTURE REINCUBATED FOR BETTER GROWTH Performed at [facility], [address] [place], [place] [address]    Report Status PENDING  Incomplete  Culture, blood (routine x 2)     Status: None (Preliminary result)   Collection Time: 11/25/17  8:25 PM  Result Value Ref Range Status   Specimen Description BLOOD LEFT ARM  Final   Special Requests   Final    BOTTLES DRAWN AEROBIC AND ANAEROBIC Blood Culture results may not be optimal due to an inadequate volume of blood received in culture bottles   Culture NO GROWTH 2 DAYS  Final   Report Status PENDING  Incomplete  Culture, blood (routine x 2)     Status: None (Preliminary result)   Collection Time: 11/25/17  8:36 PM  Result Value Ref Range Status   Specimen Description BLOOD RIGHT HAND  Final   Special Requests   Final    BOTTLES DRAWN AEROBIC AND ANAEROBIC Blood Culture adequate volume   Culture NO GROWTH 2 DAYS  Final   Report Status PENDING  Incomplete     Time coordinating discharge: 40 minutes  SIGNED:   Katrinka BlazingAlex U Kadolph, MD  [facility] 11/27/2017, 4:09 PM Pager [phone] If 7PM-7AM, please contact night-coverage [email or web address] Password [email or web address]

## 2017-11-27 NOTE — Progress Notes (Signed)
*  PRELIMINARY RESULTS* Echocardiogram 2D Echocardiogram has been performed.  Tiffany Everett, Tiffany Everett 11/27/2017, 3:25 PM

## 2017-11-27 NOTE — Progress Notes (Signed)
Pt IV infiltrated...the patient does not want to be stuck again. She stated the day MD told her that she would be going home in the am. MD made aware. No new orders. Will continue to monitor patient.

## 2017-11-28 LAB — CULTURE, GROUP A STREP (THRC)

## 2017-11-30 LAB — CULTURE, BLOOD (ROUTINE X 2)
Culture: NO GROWTH
Culture: NO GROWTH
SPECIAL REQUESTS: ADEQUATE

## 2017-12-16 DIAGNOSIS — Z712 Person consulting for explanation of examination or test findings: Secondary | ICD-10-CM | POA: Diagnosis not present

## 2017-12-16 DIAGNOSIS — R05 Cough: Secondary | ICD-10-CM | POA: Diagnosis not present

## 2017-12-16 DIAGNOSIS — R531 Weakness: Secondary | ICD-10-CM | POA: Diagnosis not present

## 2017-12-16 DIAGNOSIS — R112 Nausea with vomiting, unspecified: Secondary | ICD-10-CM | POA: Diagnosis not present

## 2017-12-16 DIAGNOSIS — R680 Hypothermia, not associated with low environmental temperature: Secondary | ICD-10-CM | POA: Diagnosis not present

## 2017-12-16 DIAGNOSIS — I10 Essential (primary) hypertension: Secondary | ICD-10-CM | POA: Diagnosis not present

## 2018-01-13 DIAGNOSIS — I10 Essential (primary) hypertension: Secondary | ICD-10-CM | POA: Diagnosis not present

## 2018-01-13 DIAGNOSIS — Z6829 Body mass index (BMI) 29.0-29.9, adult: Secondary | ICD-10-CM | POA: Diagnosis not present

## 2018-01-13 DIAGNOSIS — R350 Frequency of micturition: Secondary | ICD-10-CM | POA: Diagnosis not present

## 2018-02-13 DIAGNOSIS — R7301 Impaired fasting glucose: Secondary | ICD-10-CM | POA: Diagnosis not present

## 2018-02-13 DIAGNOSIS — E782 Mixed hyperlipidemia: Secondary | ICD-10-CM | POA: Diagnosis not present

## 2018-02-13 DIAGNOSIS — I10 Essential (primary) hypertension: Secondary | ICD-10-CM | POA: Diagnosis not present

## 2018-02-16 DIAGNOSIS — Z6829 Body mass index (BMI) 29.0-29.9, adult: Secondary | ICD-10-CM | POA: Diagnosis not present

## 2018-02-16 DIAGNOSIS — E782 Mixed hyperlipidemia: Secondary | ICD-10-CM | POA: Diagnosis not present

## 2018-02-16 DIAGNOSIS — R944 Abnormal results of kidney function studies: Secondary | ICD-10-CM | POA: Diagnosis not present

## 2018-02-16 DIAGNOSIS — R1030 Lower abdominal pain, unspecified: Secondary | ICD-10-CM | POA: Diagnosis not present

## 2018-03-05 DIAGNOSIS — Z6829 Body mass index (BMI) 29.0-29.9, adult: Secondary | ICD-10-CM | POA: Diagnosis not present

## 2018-03-05 DIAGNOSIS — Z9049 Acquired absence of other specified parts of digestive tract: Secondary | ICD-10-CM | POA: Diagnosis not present

## 2018-03-05 DIAGNOSIS — R1030 Lower abdominal pain, unspecified: Secondary | ICD-10-CM | POA: Diagnosis not present

## 2018-03-05 DIAGNOSIS — R319 Hematuria, unspecified: Secondary | ICD-10-CM | POA: Diagnosis not present

## 2018-03-05 DIAGNOSIS — Z90721 Acquired absence of ovaries, unilateral: Secondary | ICD-10-CM | POA: Diagnosis not present

## 2018-03-09 ENCOUNTER — Other Ambulatory Visit (HOSPITAL_COMMUNITY): Payer: Self-pay | Admitting: Internal Medicine

## 2018-03-09 DIAGNOSIS — R319 Hematuria, unspecified: Secondary | ICD-10-CM

## 2018-03-09 DIAGNOSIS — R1031 Right lower quadrant pain: Secondary | ICD-10-CM

## 2018-03-13 ENCOUNTER — Ambulatory Visit (HOSPITAL_COMMUNITY)
Admission: RE | Admit: 2018-03-13 | Discharge: 2018-03-13 | Disposition: A | Discharge status: Home / Self Care | Payer: Medicare HMO | Source: Ambulatory Visit | Attending: Internal Medicine | Admitting: Internal Medicine

## 2018-03-13 ENCOUNTER — Encounter (HOSPITAL_COMMUNITY): Payer: Self-pay

## 2018-03-13 DIAGNOSIS — N281 Cyst of kidney, acquired: Secondary | ICD-10-CM | POA: Insufficient documentation

## 2018-03-13 DIAGNOSIS — N200 Calculus of kidney: Secondary | ICD-10-CM | POA: Insufficient documentation

## 2018-03-13 DIAGNOSIS — R319 Hematuria, unspecified: Secondary | ICD-10-CM

## 2018-03-13 DIAGNOSIS — R1031 Right lower quadrant pain: Secondary | ICD-10-CM | POA: Diagnosis not present

## 2018-03-13 DIAGNOSIS — K8020 Calculus of gallbladder without cholecystitis without obstruction: Secondary | ICD-10-CM | POA: Diagnosis not present

## 2018-03-13 HISTORY — DX: Essential (primary) hypertension: I10

## 2018-03-13 LAB — POCT I-STAT CREATININE: Creatinine, Ser: 1.1 mg/dL — ABNORMAL HIGH (ref 0.44–1.00)

## 2018-03-13 MED ORDER — IOPAMIDOL (ISOVUE-300) INJECTION 61%
100.0000 mL | Freq: Once | INTRAVENOUS | Status: AC | PRN
  Administered 2018-03-13: 100 mL via INTRAVENOUS

## 2018-04-09 DIAGNOSIS — N200 Calculus of kidney: Secondary | ICD-10-CM | POA: Diagnosis not present

## 2018-04-09 DIAGNOSIS — R319 Hematuria, unspecified: Secondary | ICD-10-CM | POA: Diagnosis not present

## 2018-04-09 DIAGNOSIS — K8020 Calculus of gallbladder without cholecystitis without obstruction: Secondary | ICD-10-CM | POA: Diagnosis not present

## 2018-04-09 DIAGNOSIS — Z6830 Body mass index (BMI) 30.0-30.9, adult: Secondary | ICD-10-CM | POA: Diagnosis not present

## 2018-04-09 DIAGNOSIS — R1030 Lower abdominal pain, unspecified: Secondary | ICD-10-CM | POA: Diagnosis not present

## 2018-04-09 DIAGNOSIS — Z9049 Acquired absence of other specified parts of digestive tract: Secondary | ICD-10-CM | POA: Diagnosis not present

## 2018-07-10 DIAGNOSIS — R350 Frequency of micturition: Secondary | ICD-10-CM | POA: Diagnosis not present

## 2018-07-10 DIAGNOSIS — R112 Nausea with vomiting, unspecified: Secondary | ICD-10-CM | POA: Diagnosis not present

## 2018-07-10 DIAGNOSIS — E782 Mixed hyperlipidemia: Secondary | ICD-10-CM | POA: Diagnosis not present

## 2018-07-10 DIAGNOSIS — R680 Hypothermia, not associated with low environmental temperature: Secondary | ICD-10-CM | POA: Diagnosis not present

## 2018-07-10 DIAGNOSIS — H9192 Unspecified hearing loss, left ear: Secondary | ICD-10-CM | POA: Diagnosis not present

## 2018-07-10 DIAGNOSIS — R1030 Lower abdominal pain, unspecified: Secondary | ICD-10-CM | POA: Diagnosis not present

## 2018-07-10 DIAGNOSIS — R944 Abnormal results of kidney function studies: Secondary | ICD-10-CM | POA: Diagnosis not present

## 2018-07-10 DIAGNOSIS — I10 Essential (primary) hypertension: Secondary | ICD-10-CM | POA: Diagnosis not present

## 2018-07-10 DIAGNOSIS — R531 Weakness: Secondary | ICD-10-CM | POA: Diagnosis not present

## 2018-07-10 DIAGNOSIS — R05 Cough: Secondary | ICD-10-CM | POA: Diagnosis not present

## 2018-07-15 DIAGNOSIS — R944 Abnormal results of kidney function studies: Secondary | ICD-10-CM | POA: Diagnosis not present

## 2018-07-15 DIAGNOSIS — Z6830 Body mass index (BMI) 30.0-30.9, adult: Secondary | ICD-10-CM | POA: Diagnosis not present

## 2018-07-15 DIAGNOSIS — R7301 Impaired fasting glucose: Secondary | ICD-10-CM | POA: Diagnosis not present

## 2018-07-15 DIAGNOSIS — I499 Cardiac arrhythmia, unspecified: Secondary | ICD-10-CM | POA: Diagnosis not present

## 2018-07-15 DIAGNOSIS — E782 Mixed hyperlipidemia: Secondary | ICD-10-CM | POA: Diagnosis not present

## 2018-08-25 DIAGNOSIS — Z23 Encounter for immunization: Secondary | ICD-10-CM | POA: Diagnosis not present

## 2018-08-25 DIAGNOSIS — Z0000 Encounter for general adult medical examination without abnormal findings: Secondary | ICD-10-CM | POA: Diagnosis not present

## 2019-01-25 DIAGNOSIS — R944 Abnormal results of kidney function studies: Secondary | ICD-10-CM | POA: Diagnosis not present

## 2019-01-25 DIAGNOSIS — I10 Essential (primary) hypertension: Secondary | ICD-10-CM | POA: Diagnosis not present

## 2019-01-25 DIAGNOSIS — E782 Mixed hyperlipidemia: Secondary | ICD-10-CM | POA: Diagnosis not present

## 2019-01-25 DIAGNOSIS — R7301 Impaired fasting glucose: Secondary | ICD-10-CM | POA: Diagnosis not present

## 2019-03-16 ENCOUNTER — Other Ambulatory Visit: Payer: Self-pay | Admitting: Internal Medicine

## 2019-03-16 DIAGNOSIS — E2839 Other primary ovarian failure: Secondary | ICD-10-CM

## 2019-03-29 DIAGNOSIS — Z1212 Encounter for screening for malignant neoplasm of rectum: Secondary | ICD-10-CM | POA: Diagnosis not present

## 2019-03-29 DIAGNOSIS — Z1211 Encounter for screening for malignant neoplasm of colon: Secondary | ICD-10-CM | POA: Diagnosis not present

## 2019-04-05 NOTE — Progress Notes (Signed)
REVIEWED-NO ADDITIONAL RECOMMENDATIONS. 

## 2019-07-01 DIAGNOSIS — Z0000 Encounter for general adult medical examination without abnormal findings: Secondary | ICD-10-CM | POA: Diagnosis not present

## 2019-08-26 DIAGNOSIS — I10 Essential (primary) hypertension: Secondary | ICD-10-CM | POA: Diagnosis not present

## 2019-08-26 DIAGNOSIS — E782 Mixed hyperlipidemia: Secondary | ICD-10-CM | POA: Diagnosis not present

## 2019-08-26 DIAGNOSIS — R7301 Impaired fasting glucose: Secondary | ICD-10-CM | POA: Diagnosis not present

## 2019-09-03 DIAGNOSIS — E663 Overweight: Secondary | ICD-10-CM | POA: Diagnosis not present

## 2019-09-03 DIAGNOSIS — N183 Chronic kidney disease, stage 3 (moderate): Secondary | ICD-10-CM | POA: Diagnosis not present

## 2019-09-03 DIAGNOSIS — R7303 Prediabetes: Secondary | ICD-10-CM | POA: Diagnosis not present

## 2019-09-03 DIAGNOSIS — R7301 Impaired fasting glucose: Secondary | ICD-10-CM | POA: Diagnosis not present

## 2019-09-03 DIAGNOSIS — I499 Cardiac arrhythmia, unspecified: Secondary | ICD-10-CM | POA: Diagnosis not present

## 2019-09-03 DIAGNOSIS — E782 Mixed hyperlipidemia: Secondary | ICD-10-CM | POA: Diagnosis not present

## 2019-09-03 DIAGNOSIS — Z6828 Body mass index (BMI) 28.0-28.9, adult: Secondary | ICD-10-CM | POA: Diagnosis not present

## 2019-09-29 ENCOUNTER — Other Ambulatory Visit (HOSPITAL_COMMUNITY): Payer: Self-pay | Admitting: Internal Medicine

## 2019-09-29 DIAGNOSIS — Z1231 Encounter for screening mammogram for malignant neoplasm of breast: Secondary | ICD-10-CM

## 2019-10-28 DIAGNOSIS — Z809 Family history of malignant neoplasm, unspecified: Secondary | ICD-10-CM | POA: Diagnosis not present

## 2019-10-28 DIAGNOSIS — R32 Unspecified urinary incontinence: Secondary | ICD-10-CM | POA: Diagnosis not present

## 2019-10-28 DIAGNOSIS — Z791 Long term (current) use of non-steroidal anti-inflammatories (NSAID): Secondary | ICD-10-CM | POA: Diagnosis not present

## 2019-10-28 DIAGNOSIS — Z833 Family history of diabetes mellitus: Secondary | ICD-10-CM | POA: Diagnosis not present

## 2020-02-01 ENCOUNTER — Encounter (HOSPITAL_COMMUNITY): Payer: Self-pay | Admitting: Internal Medicine

## 2020-02-01 ENCOUNTER — Emergency Department (HOSPITAL_COMMUNITY): Payer: Medicare HMO

## 2020-02-01 ENCOUNTER — Other Ambulatory Visit: Payer: Self-pay

## 2020-02-01 ENCOUNTER — Observation Stay (HOSPITAL_COMMUNITY)
Admission: EM | Admit: 2020-02-01 | Discharge: 2020-02-03 | Disposition: A | Discharge status: Home / Self Care | Payer: Medicare HMO | Attending: Family Medicine | Admitting: Family Medicine

## 2020-02-01 DIAGNOSIS — Y939 Activity, unspecified: Secondary | ICD-10-CM | POA: Diagnosis not present

## 2020-02-01 DIAGNOSIS — I10 Essential (primary) hypertension: Secondary | ICD-10-CM | POA: Insufficient documentation

## 2020-02-01 DIAGNOSIS — Z7982 Long term (current) use of aspirin: Secondary | ICD-10-CM | POA: Diagnosis not present

## 2020-02-01 DIAGNOSIS — S060X1A Concussion with loss of consciousness of 30 minutes or less, initial encounter: Secondary | ICD-10-CM | POA: Diagnosis present

## 2020-02-01 DIAGNOSIS — Z20822 Contact with and (suspected) exposure to covid-19: Secondary | ICD-10-CM | POA: Diagnosis not present

## 2020-02-01 DIAGNOSIS — S299XXA Unspecified injury of thorax, initial encounter: Secondary | ICD-10-CM | POA: Diagnosis not present

## 2020-02-01 DIAGNOSIS — S0011XA Contusion of right eyelid and periocular area, initial encounter: Secondary | ICD-10-CM | POA: Diagnosis not present

## 2020-02-01 DIAGNOSIS — D751 Secondary polycythemia: Secondary | ICD-10-CM | POA: Diagnosis present

## 2020-02-01 DIAGNOSIS — W1830XA Fall on same level, unspecified, initial encounter: Secondary | ICD-10-CM | POA: Insufficient documentation

## 2020-02-01 DIAGNOSIS — Y92008 Other place in unspecified non-institutional (private) residence as the place of occurrence of the external cause: Secondary | ICD-10-CM | POA: Diagnosis not present

## 2020-02-01 DIAGNOSIS — Z79899 Other long term (current) drug therapy: Secondary | ICD-10-CM | POA: Insufficient documentation

## 2020-02-01 DIAGNOSIS — R748 Abnormal levels of other serum enzymes: Secondary | ICD-10-CM | POA: Diagnosis present

## 2020-02-01 DIAGNOSIS — Y999 Unspecified external cause status: Secondary | ICD-10-CM | POA: Insufficient documentation

## 2020-02-01 DIAGNOSIS — S0993XA Unspecified injury of face, initial encounter: Secondary | ICD-10-CM | POA: Diagnosis not present

## 2020-02-01 DIAGNOSIS — R9431 Abnormal electrocardiogram [ECG] [EKG]: Secondary | ICD-10-CM | POA: Diagnosis present

## 2020-02-01 DIAGNOSIS — S7001XA Contusion of right hip, initial encounter: Secondary | ICD-10-CM | POA: Diagnosis not present

## 2020-02-01 DIAGNOSIS — R410 Disorientation, unspecified: Secondary | ICD-10-CM | POA: Diagnosis not present

## 2020-02-01 DIAGNOSIS — S199XXA Unspecified injury of neck, initial encounter: Secondary | ICD-10-CM | POA: Diagnosis not present

## 2020-02-01 DIAGNOSIS — W19XXXA Unspecified fall, initial encounter: Secondary | ICD-10-CM

## 2020-02-01 DIAGNOSIS — S060X9A Concussion with loss of consciousness of unspecified duration, initial encounter: Secondary | ICD-10-CM | POA: Diagnosis not present

## 2020-02-01 DIAGNOSIS — Z03818 Encounter for observation for suspected exposure to other biological agents ruled out: Secondary | ICD-10-CM | POA: Diagnosis not present

## 2020-02-01 DIAGNOSIS — S0990XA Unspecified injury of head, initial encounter: Secondary | ICD-10-CM | POA: Diagnosis not present

## 2020-02-01 LAB — COMPREHENSIVE METABOLIC PANEL
ALT: 20 U/L (ref 0–44)
AST: 27 U/L (ref 15–41)
Albumin: 4.4 g/dL (ref 3.5–5.0)
Alkaline Phosphatase: 83 U/L (ref 38–126)
Anion gap: 12 (ref 5–15)
BUN: 18 mg/dL (ref 8–23)
CO2: 24 mmol/L (ref 22–32)
Calcium: 9 mg/dL (ref 8.9–10.3)
Chloride: 102 mmol/L (ref 98–111)
Creatinine, Ser: 0.86 mg/dL (ref 0.44–1.00)
GFR calc Af Amer: >60 mL/min (ref >60)
GFR calc non Af Amer: >60 mL/min (ref >60)
Glucose, Bld: 86 mg/dL (ref 70–99)
Potassium: 3.8 mmol/L (ref 3.5–5.1)
Sodium: 138 mmol/L (ref 135–145)
Total Bilirubin: 0.9 mg/dL (ref 0.3–1.2)
Total Protein: 7.3 g/dL (ref 6.5–8.1)

## 2020-02-01 LAB — CBC WITH DIFFERENTIAL/PLATELET
Abs Immature Granulocytes: 0.03 10*3/uL (ref 0.00–0.07)
Basophils Absolute: 0.1 10*3/uL (ref 0.0–0.1)
Basophils Relative: 1 %
Eosinophils Absolute: 0.2 10*3/uL (ref 0.0–0.5)
Eosinophils Relative: 2 %
HCT: 48.6 % — ABNORMAL HIGH (ref 36.0–46.0)
Hemoglobin: 16.1 g/dL — ABNORMAL HIGH (ref 12.0–15.0)
Immature Granulocytes: 0 %
Lymphocytes Relative: 13 %
Lymphs Abs: 1.2 10*3/uL (ref 0.7–4.0)
MCH: 29.3 pg (ref 26.0–34.0)
MCHC: 33.1 g/dL (ref 30.0–36.0)
MCV: 88.5 fL (ref 80.0–100.0)
Monocytes Absolute: 0.6 10*3/uL (ref 0.1–1.0)
Monocytes Relative: 7 %
Neutro Abs: 6.9 10*3/uL (ref 1.7–7.7)
Neutrophils Relative %: 77 %
Platelets: 190 10*3/uL (ref 150–400)
RBC: 5.49 MIL/uL — ABNORMAL HIGH (ref 3.87–5.11)
RDW: 13.9 % (ref 11.5–15.5)
WBC: 9 10*3/uL (ref 4.0–10.5)
nRBC: 0 % (ref 0.0–0.2)

## 2020-02-01 LAB — RESPIRATORY PANEL BY RT PCR (FLU A&B, COVID)
Influenza A by PCR: NEGATIVE
Influenza B by PCR: NEGATIVE
SARS Coronavirus 2 by RT PCR: NEGATIVE

## 2020-02-01 LAB — PROTIME-INR
INR: 1 (ref 0.8–1.2)
Prothrombin Time: 12.5 seconds (ref 11.4–15.2)

## 2020-02-01 LAB — TROPONIN I (HIGH SENSITIVITY): Troponin I (High Sensitivity): 7 ng/L (ref <18)

## 2020-02-01 LAB — CK: Total CK: 383 U/L — ABNORMAL HIGH (ref 38–234)

## 2020-02-01 MED ORDER — ACETAMINOPHEN 325 MG PO TABS: 650.0000 mg | ORAL_TABLET | Freq: Four times a day (QID) | ORAL | Status: DC | PRN

## 2020-02-01 MED ORDER — ONDANSETRON HCL 4 MG PO TABS: 4.0000 mg | ORAL_TABLET | Freq: Four times a day (QID) | ORAL | Status: DC | PRN

## 2020-02-01 MED ORDER — MAGNESIUM SULFATE 2 GM/50ML IV SOLN
2.0000 g | Freq: Once | INTRAVENOUS | Status: AC
  Administered 2020-02-01: 2 g via INTRAVENOUS
  Filled 2020-02-01: qty 50

## 2020-02-01 MED ORDER — PROCHLORPERAZINE EDISYLATE 10 MG/2ML IJ SOLN: 5.0000 mg | INTRAMUSCULAR | Status: DC | PRN

## 2020-02-01 MED ORDER — ONDANSETRON HCL 4 MG/2ML IJ SOLN: 4.0000 mg | Freq: Four times a day (QID) | INTRAMUSCULAR | Status: DC | PRN

## 2020-02-01 MED ORDER — SODIUM CHLORIDE 0.9 % IV SOLN: INTRAVENOUS | Status: AC

## 2020-02-01 MED ORDER — ACETAMINOPHEN 650 MG RE SUPP: 650.0000 mg | Freq: Four times a day (QID) | RECTAL | Status: DC | PRN

## 2020-02-01 NOTE — ED Notes (Addendum)
Labs delayed by difficulty obtaining venipuncture and/or iv access. Multiple attempts made. Will continue to seek access.

## 2020-02-01 NOTE — H&P (Signed)
History and Physical    Tiffany Everett SFK:[id number] DOB: 1941/06/21 DOA: 02/01/2020  PCP: Benita Stabile, MD   Patient coming from: Home.  I have personally briefly reviewed patient's old medical records in [facility]  Chief Complaint: LOC.  HPI: Tiffany Everett is a 79 y.o. female with medical history significant of colon polyps, hypertension, seasonal allergies who lives alone and is brought to the emergency department after her son found her lying down on the floor at her home.  The son believes that she had spent at least 6 hours lying down on the floor.  The patient stated that she has LOC and remembers that she fell just before passing out, but is only oriented to self and unable to provide further details.  She knows she is in the hospital, but thought that she was in [place] or [place].  She was able to tell me that it was Tuesday after first responding that it was Monday.  She knew that it was February with some difficulty, but could not tell me the year.  She told me that the president was Chalmers Guest, but after reviewing corrected and thinking about it, she said that he was the person that was running against Trump.  She complains of headache, but denies blurred vision, rhinorrhea, sore throat, chest pain, dyspnea, dizziness, nausea, vomiting, abdominal pain, back pain or dysuria.  ED Course: Initial vital signs temperature 95.9 F, pulse 95, respirations 16, blood pressure 154/100 mmHg and O2 sat 98% on room air.  I gave the patient magnesium sulfate in the emergency department due to borderline prolonged QT.  White count is 9.0, hemoglobin 16.1 g/dL and platelets 174.  PT was 12.5 seconds and INR 1.0.  First troponin is normal.  CMP is normal.  Total CK is mildly elevated at 383 units/L.  EKG is sinus rhythm was borderline prolonged QT interval and LVH with secondary repolarization abnormality.  Imaging: Her chest radiograph did not show any acute intrathoracic progress.   Bilateral pelvis showed degenerative changes of the hips and lumbar spine, but no acute fracture.  CT maxillofacial shows some soft tissue edema and bruising around the right orbit with a small subcutaneous hematoma at the superolateral aspect of the right orbit.  No other acute abnormalities were seen.  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  There was no acute abnormality on C-spine CT.  Please see images and full radiology reports for further detail.  Review of Systems: As per HPI otherwise 10 point review of systems negative.   Past Medical History:  Diagnosis Date  . Colon polyps   . Hypertension   . Seasonal allergies     Past Surgical History:  Procedure Laterality Date  . APPENDECTOMY    . CATARACT EXTRACTION W/PHACO Right 09/13/2013   Procedure: CATARACT EXTRACTION PHACO AND INTRAOCULAR LENS PLACEMENT (IOC);  Surgeon: Susa Simmonds, MD;  Location: [facility];  Service: Ophthalmology;  Laterality: Right;  CDE:3.25  . CATARACT EXTRACTION W/PHACO Left 11/22/2013   Procedure: CATARACT EXTRACTION PHACO AND INTRAOCULAR LENS PLACEMENT (IOC);  Surgeon: Susa Simmonds, MD;  Location: [facility];  Service: Ophthalmology;  Laterality: Left;  CDE: 3.95  . CESAREAN SECTION     x2  . COLONOSCOPY     SLF 2010: sigmoid diverticula, otherwise wnl. Repeat 5 yrs.  . I & D EXTREMITY Left    lateral wrist  . LAPAROTOMY     oopherectomy  .  ORIF HUMERUS FRACTURE Left      reports that she has never smoked. She has never used smokeless tobacco. She reports that she does not drink alcohol or use drugs.  No Known Allergies  Family History  Problem Relation Age of Onset  . Colon cancer Mother 70  . Colon cancer Father 1575   Prior to Admission m64edications   Medication Sig Start Date End Date Taking? Authorizing Provider  aspirin EC 81 MG tablet Take 81 mg by mouth at bedtime.    [provider]  lisinopril (PRINIVIL,ZESTRIL) 10 MG tablet Take 1  tablet (10 mg total) by mouth daily. 11/28/17   Filbert SchilderKadolph, Alexandria U, MD  Melatonin 3 MG CAPS Take 3 mg by mouth at bedtime.    [provider]  potassium chloride SA (K-DUR,KLOR-CON) 20 MEQ tablet Take 1 tablet (20 mEq total) by mouth daily. 11/27/17   Filbert SchilderKadolph, Alexandria U, MD    Physical Exam: Vitals:   02/01/20 2204 02/01/20 2309 02/01/20 2330 02/01/20 2340  BP: 132/63 (!) 161/91 (!) 157/100 (!) 159/101  Pulse: 75 89    Resp:  (!) 22 17 14   Temp:  97.7 F (36.5 C)    TempSrc:      SpO2: 100% 100%    Weight:      Height:        Constitutional: NAD, calm, comfortable Eyes: PERRL, lids and conjunctivae normal.  Right palpebral and periorbital ecchymosis. ENMT: Mucous membranes are moist. Posterior pharynx clear of any exudate or lesions. Neck: normal, supple, no masses, no thyromegaly Respiratory: clear to auscultation bilaterally, no wheezing, no crackles. Normal respiratory effort. No accessory muscle use.  Cardiovascular: Regular rate and rhythm, no murmurs / rubs / gallops. No extremity edema. 2+ pedal pulses. No carotid bruits.  Abdomen: Nondistended.  BS positive.  Soft, no tenderness, no masses palpated. No hepatosplenomegaly.  Musculoskeletal: Generalized weakness.  No clubbing / cyanosis. Good ROM, no contractures. Normal muscle tone.  Skin: Palpebral and periorbital ecchymosis on the right eye. Neurologic: CN 2-12 grossly intact. Sensation intact, DTR normal. Strength 4/5 in all 4.  Psychiatric: Alert, oriented to name, knows she is in the hospital but [place] is in [place].  Oriented to month and date of birth.       Labs on Admission: I have personally reviewed following labs and imaging studies  CBC: Recent Labs  Lab 02/01/20 2009  WBC 9.0  NEUTROABS 6.9  HGB 16.1*  HCT 48.6*  MCV 88.5  PLT 190   Basic Metabolic Panel: Recent Labs  Lab 02/01/20 2009  NA 138  K 3.8  CL 102  CO2 24  GLUCOSE 86  BUN 18  CREATININE 0.86  CALCIUM 9.0     GFR: Estimated Creatinine Clearance: 63.1 mL/min (by C-G formula based on SCr of 0.86 mg/dL). Liver Function Tests: Recent Labs  Lab 02/01/20 2009  AST 27  ALT 20  ALKPHOS 83  BILITOT 0.9  PROT 7.3  ALBUMIN 4.4   No results for input(s): LIPASE, AMYLASE in the last 168 hours. No results for input(s): AMMONIA in the last 168 hours. Coagulation Profile: Recent Labs  Lab 02/01/20 2009  INR 1.0   Cardiac Enzymes: Recent Labs  Lab 02/01/20 2009  CKTOTAL 383*   BNP (last 3 results) No results for input(s): PROBNP in the last 8760 hours. HbA1C: No results for input(s): HGBA1C in the last 72 hours. CBG: No results for input(s): GLUCAP in the last 168 hours. Lipid Profile: No results  for input(s): CHOL, HDL, LDLCALC, TRIG, CHOLHDL, LDLDIRECT in the last 72 hours. Thyroid Function Tests: No results for input(s): TSH, T4TOTAL, FREET4, T3FREE, THYROIDAB in the last 72 hours. Anemia Panel: No results for input(s): VITAMINB12, FOLATE, FERRITIN, TIBC, IRON, RETICCTPCT in the last 72 hours. Urine analysis:    Component Value Date/Time   COLORURINE STRAW (A) 11/25/2017 1506   APPEARANCEUR HAZY (A) 11/25/2017 1506   LABSPEC 1.008 11/25/2017 1506   PHURINE 7.0 11/25/2017 1506   GLUCOSEU NEGATIVE 11/25/2017 1506   HGBUR NEGATIVE 11/25/2017 1506   BILIRUBINUR NEGATIVE 11/25/2017 1506   KETONESUR NEGATIVE 11/25/2017 1506   PROTEINUR NEGATIVE 11/25/2017 1506   UROBILINOGEN 0.2 06/06/2009 1611   NITRITE NEGATIVE 11/25/2017 1506   LEUKOCYTESUR NEGATIVE 11/25/2017 1506    Radiological Exams on Admission: CT Head Wo Contrast  Result Date: 02/01/2020 CLINICAL DATA:  The patient was found down after a fall today. Facial trauma. Altered mental status. EXAM: CT HEAD WITHOUT CONTRAST CT MAXILLOFACIAL WITHOUT CONTRAST CT CERVICAL SPINE WITHOUT CONTRAST TECHNIQUE: Multidetector CT imaging of the head, cervical spine, and maxillofacial structures were performed using the standard protocol  without intravenous contrast. Multiplanar CT image reconstructions of the cervical spine and maxillofacial structures were also generated. COMPARISON:  None. FINDINGS: CT HEAD FINDINGS Brain: No evidence of acute infarction, hemorrhage, hydrocephalus, extra-axial collection or mass lesion/mass effect. The patient has multiple old infarcts including in the right posterior frontal region as well as multiple infarcts in the basal ganglia bilaterally, more prominent on the left than the right. Vascular: No hyperdense vessel or unexpected calcification. Skull: Normal. Negative for fracture or focal lesion. Other: None CT MAXILLOFACIAL FINDINGS Osseous: No fracture or mandibular dislocation. No destructive process. Orbits: Soft tissue edema or bruising at the superior and lateral aspects of the right orbit with a 11 mm hematoma at the lateral aspect of the superior orbital rim. Sinuses: No acute abnormality. Retention cysts in both maxillary sinuses. Slight mucosal thickening in multiple ethmoid air cells. Soft tissues: Soft tissue contusion around the right orbit with a small subcutaneous hematoma at the superolateral aspect of the right orbit. CT CERVICAL SPINE FINDINGS Alignment: Straightening of the lower cervical lordosis. 2 mm spondylolisthesis of C7 on T1 due to facet arthritis. Skull base and vertebrae: No acute fracture. No primary bone lesion or focal pathologic process. Soft tissues and spinal canal: No prevertebral fluid or swelling. No visible canal hematoma. Disc levels: C2-3: No disc bulging or protrusion. Severe left facet arthritis. Mild right facet arthritis. No foraminal stenosis. C3-4: Normal disc. Moderate right facet arthritis. No foraminal stenosis. C4-5: Slight disc space narrowing. Small central disc bulge with accompanying osteophytes without neural impingement. Moderate right facet arthritis. No foraminal stenosis. C5-6: Disc space narrowing. Broad-based disc osteophyte complex. No facet  arthritis. No significant foraminal stenosis. C6-7: Disc space narrowing. Disc osteophyte complex asymmetric to the left with narrowing of the left lateral recess and slight narrowing of the left neural foramen. No facet arthritis. C7-T1: 2 mm spondylolisthesis due to severe left and mild right facet arthritis. No disc bulging or protrusion. Moderately severe left foraminal stenosis. T1-2 and T2-3: No significant abnormalities. Upper chest: No acute abnormalities. Other: None IMPRESSION: 1. No acute intracranial abnormality. Multiple old infarcts. 2. No acute abnormality of the cervical spine. 3. Soft tissue contusion around the right orbit with a small subcutaneous hematoma at the superolateral aspect of the right orbit. 4. No other acute abnormality of the maxillofacial structures. Electronically Signed   By: Lorriane Shire  M.D.   On: 02/01/2020 19:27   CT Cervical Spine Wo Contrast  Result Date: 02/01/2020 CLINICAL DATA:  The patient was found down after a fall today. Facial trauma. Altered mental status. EXAM: CT HEAD WITHOUT CONTRAST CT MAXILLOFACIAL WITHOUT CONTRAST CT CERVICAL SPINE WITHOUT CONTRAST TECHNIQUE: Multidetector CT imaging of the head, cervical spine, and maxillofacial structures were performed using the standard protocol without intravenous contrast. Multiplanar CT image reconstructions of the cervical spine and maxillofacial structures were also generated. COMPARISON:  None. FINDINGS: CT HEAD FINDINGS Brain: No evidence of acute infarction, hemorrhage, hydrocephalus, extra-axial collection or mass lesion/mass effect. The patient has multiple old infarcts including in the right posterior frontal region as well as multiple infarcts in the basal ganglia bilaterally, more prominent on the left than the right. Vascular: No hyperdense vessel or unexpected calcification. Skull: Normal. Negative for fracture or focal lesion. Other: None CT MAXILLOFACIAL FINDINGS Osseous: No fracture or mandibular  dislocation. No destructive process. Orbits: Soft tissue edema or bruising at the superior and lateral aspects of the right orbit with a 11 mm hematoma at the lateral aspect of the superior orbital rim. Sinuses: No acute abnormality. Retention cysts in both maxillary sinuses. Slight mucosal thickening in multiple ethmoid air cells. Soft tissues: Soft tissue contusion around the right orbit with a small subcutaneous hematoma at the superolateral aspect of the right orbit. CT CERVICAL SPINE FINDINGS Alignment: Straightening of the lower cervical lordosis. 2 mm spondylolisthesis of C7 on T1 due to facet arthritis. Skull base and vertebrae: No acute fracture. No primary bone lesion or focal pathologic process. Soft tissues and spinal canal: No prevertebral fluid or swelling. No visible canal hematoma. Disc levels: C2-3: No disc bulging or protrusion. Severe left facet arthritis. Mild right facet arthritis. No foraminal stenosis. C3-4: Normal disc. Moderate right facet arthritis. No foraminal stenosis. C4-5: Slight disc space narrowing. Small central disc bulge with accompanying osteophytes without neural impingement. Moderate right facet arthritis. No foraminal stenosis. C5-6: Disc space narrowing. Broad-based disc osteophyte complex. No facet arthritis. No significant foraminal stenosis. C6-7: Disc space narrowing. Disc osteophyte complex asymmetric to the left with narrowing of the left lateral recess and slight narrowing of the left neural foramen. No facet arthritis. C7-T1: 2 mm spondylolisthesis due to severe left and mild right facet arthritis. No disc bulging or protrusion. Moderately severe left foraminal stenosis. T1-2 and T2-3: No significant abnormalities. Upper chest: No acute abnormalities. Other: None IMPRESSION: 1. No acute intracranial abnormality. Multiple old infarcts. 2. No acute abnormality of the cervical spine. 3. Soft tissue contusion around the right orbit with a small subcutaneous hematoma at  the superolateral aspect of the right orbit. 4. No other acute abnormality of the maxillofacial structures. Electronically Signed   By: Francene Boyers M.D.   On: 02/01/2020 19:27   DG Chest Portable 1 View  Result Date: 02/01/2020 CLINICAL DATA:  Un witnessed fall, found down EXAM: PORTABLE CHEST 1 VIEW COMPARISON:  11/25/2017 FINDINGS: Single frontal view of the chest demonstrates a stable cardiac silhouette. Atherosclerosis of the aortic arch again noted. No airspace disease, effusion, or pneumothorax. Chronic postsurgical changes left humerus. No acute bony abnormalities. IMPRESSION: 1. No acute intrathoracic process. Electronically Signed   By: Sharlet Salina M.D.   On: 02/01/2020 19:26   DG Hips Bilat W or Wo Pelvis 3-4 Views  Result Date: 02/01/2020 CLINICAL DATA:  Found down, bruising and swelling EXAM: DG HIP (WITH OR WITHOUT PELVIS) 3-4V BILAT COMPARISON:  03/13/2018 FINDINGS: Frontal view of the  pelvis as well as frontal and frogleg lateral views of both hips are obtained. There are no acute displaced fractures. The hips are well aligned. Mild symmetrical hip osteoarthritis. Moderate lower lumbar spondylosis. Sacroiliac joints are normal. IMPRESSION: 1. Degenerative changes of the hips and lumbar spine. 2. No acute fracture. Electronically Signed   By: Sharlet Salina M.D.   On: 02/01/2020 19:28   CT Maxillofacial Wo Contrast  Result Date: 02/01/2020 CLINICAL DATA:  The patient was found down after a fall today. Facial trauma. Altered mental status. EXAM: CT HEAD WITHOUT CONTRAST CT MAXILLOFACIAL WITHOUT CONTRAST CT CERVICAL SPINE WITHOUT CONTRAST TECHNIQUE: Multidetector CT imaging of the head, cervical spine, and maxillofacial structures were performed using the standard protocol without intravenous contrast. Multiplanar CT image reconstructions of the cervical spine and maxillofacial structures were also generated. COMPARISON:  None. FINDINGS: CT HEAD FINDINGS Brain: No evidence of acute  infarction, hemorrhage, hydrocephalus, extra-axial collection or mass lesion/mass effect. The patient has multiple old infarcts including in the right posterior frontal region as well as multiple infarcts in the basal ganglia bilaterally, more prominent on the left than the right. Vascular: No hyperdense vessel or unexpected calcification. Skull: Normal. Negative for fracture or focal lesion. Other: None CT MAXILLOFACIAL FINDINGS Osseous: No fracture or mandibular dislocation. No destructive process. Orbits: Soft tissue edema or bruising at the superior and lateral aspects of the right orbit with a 11 mm hematoma at the lateral aspect of the superior orbital rim. Sinuses: No acute abnormality. Retention cysts in both maxillary sinuses. Slight mucosal thickening in multiple ethmoid air cells. Soft tissues: Soft tissue contusion around the right orbit with a small subcutaneous hematoma at the superolateral aspect of the right orbit. CT CERVICAL SPINE FINDINGS Alignment: Straightening of the lower cervical lordosis. 2 mm spondylolisthesis of C7 on T1 due to facet arthritis. Skull base and vertebrae: No acute fracture. No primary bone lesion or focal pathologic process. Soft tissues and spinal canal: No prevertebral fluid or swelling. No visible canal hematoma. Disc levels: C2-3: No disc bulging or protrusion. Severe left facet arthritis. Mild right facet arthritis. No foraminal stenosis. C3-4: Normal disc. Moderate right facet arthritis. No foraminal stenosis. C4-5: Slight disc space narrowing. Small central disc bulge with accompanying osteophytes without neural impingement. Moderate right facet arthritis. No foraminal stenosis. C5-6: Disc space narrowing. Broad-based disc osteophyte complex. No facet arthritis. No significant foraminal stenosis. C6-7: Disc space narrowing. Disc osteophyte complex asymmetric to the left with narrowing of the left lateral recess and slight narrowing of the left neural foramen. No facet  arthritis. C7-T1: 2 mm spondylolisthesis due to severe left and mild right facet arthritis. No disc bulging or protrusion. Moderately severe left foraminal stenosis. T1-2 and T2-3: No significant abnormalities. Upper chest: No acute abnormalities. Other: None IMPRESSION: 1. No acute intracranial abnormality. Multiple old infarcts. 2. No acute abnormality of the cervical spine. 3. Soft tissue contusion around the right orbit with a small subcutaneous hematoma at the superolateral aspect of the right orbit. 4. No other acute abnormality of the maxillofacial structures. Electronically Signed   By: Francene Boyers M.D.   On: 02/01/2020 19:27    EKG: Independently reviewed.  Vent. rate 72 BPM PR interval * ms QRS duration 135 ms QT/QTc 452/495 ms P-R-T axes 76 -11 * Sinus rhythm LVH with secondary repolarization abnormality Borderline prolonged QT interval Artifact in lead(s) I aVR aVL Poor data quality in current ECG precludes serial comparison  Assessment/Plan Principal Problem:   Concussion with brief loss  of consciousness Observation/telemetry. Frequent neuro checks. Acetaminophen for headache. Compazine as needed. Consider further imaging if no improvement. Consider neurology evaluation if no improvement.  Active Problems:   Borderline prolonged QT interval Magnesium sulfate 2 g IVPB. Avoid medications that cause QT prolongation. Follow-up EKG in a.m.    Hypertension Resume lisinopril in a.m. Monitor BP, renal function electrolytes.    Polycythemia Likely hemoconcentration. Gentle/time-limited IV fluids. Follow-up H&H.    Elevated CK Gentle IV hydration.   DVT prophylaxis: SCDs Code Status: Full code. Family Communication: Disposition Plan: Observation for concussion monitory. Consults called: Admission status: Observation/telemetry.   Bobette Mo MD [facility]  If 7PM-7AM, please contact night-coverage [email or web address]  02/01/2020, 11:45 PM    This document was prepared using Dragon voice recognition software and may contain some unintended transcription errors.

## 2020-02-01 NOTE — ED Provider Notes (Signed)
[facility] Provider Note   CSN: [id number] Arrival date & time: 02/01/20  1503     History Chief Complaint  Patient presents with  . Fall    Tiffany Everett is a 79 y.o. female.  HPI      Tiffany Everett is a 79 y.o. female who presents to the Emergency Department secondary to a fall that occurred earlier today.  Patient states that she last remembers waking up wanting to go to the bathroom.  She states that she "did not feel right."  She does not recall the events that occurred after that.  Patient's son checked on her around 11:00 today and found her lying in the floor between her bedroom and bathroom.  He is unsure how long she had been in the floor.  He states that at baseline, his mother is very active and cares for herself.  Since this morning, he states she appears confused.  She denies pain, visual changes, dizziness, abdominal pain, vomiting or diarrhea.  Son denies any medication changes, or recent illness.  He checked on her yesterday afternoon and she was well.   Past Medical History:  Diagnosis Date  . Colon polyps   . Hypertension   . Seasonal allergies     Patient Active Problem List   Diagnosis Date Noted  . Hypothermia 11/25/2017  . N&V (nausea and vomiting) 11/25/2017  . History of colonic polyps 04/24/2015    Past Surgical History:  Procedure Laterality Date  . APPENDECTOMY    . CATARACT EXTRACTION W/PHACO Right 09/13/2013   Procedure: CATARACT EXTRACTION PHACO AND INTRAOCULAR LENS PLACEMENT (IOC);  Surgeon: Williams Che, MD;  Location: [facility];  Service: Ophthalmology;  Laterality: Right;  CDE:3.25  . CATARACT EXTRACTION W/PHACO Left 11/22/2013   Procedure: CATARACT EXTRACTION PHACO AND INTRAOCULAR LENS PLACEMENT (IOC);  Surgeon: Williams Che, MD;  Location: [facility];  Service: Ophthalmology;  Laterality: Left;  CDE: 3.95  . CESAREAN SECTION     x2  . COLONOSCOPY     SLF 2010: sigmoid diverticula, otherwise wnl. Repeat  5 yrs.  . I & D EXTREMITY Left    lateral wrist  . LAPAROTOMY     oopherectomy  . ORIF HUMERUS FRACTURE Left      OB History   No obstetric history on file.     Family History  Problem Relation Age of Onset  . Colon cancer Mother 71  . Colon cancer Father 43    Social History   Tobacco Use  . Smoking status: Never Smoker  . Smokeless tobacco: Never Used  . Tobacco comment: Never smoked  Substance Use Topics  . Alcohol use: No    Alcohol/week: 0.0 standard drinks  . Drug use: No    Home Medications Prior to Admission medications   Medication Sig Start Date End Date Taking? Authorizing Provider  aspirin EC 81 MG tablet Take 81 mg by mouth at bedtime.    [provider]  lisinopril (PRINIVIL,ZESTRIL) 10 MG tablet Take 1 tablet (10 mg total) by mouth daily. 11/28/17   Eber Jones, MD  Melatonin 3 MG CAPS Take 3 mg by mouth at bedtime.    [provider]  potassium chloride SA (K-DUR,KLOR-CON) 20 MEQ tablet Take 1 tablet (20 mEq total) by mouth daily. 11/27/17   Eber Jones, MD    Allergies    Patient has no known allergies.  Review of Systems   Review of Systems  Constitutional: Negative  for appetite change, chills and fever.  HENT:       Bruising tenderness around the right eye  Eyes: Negative for visual disturbance.  Respiratory: Negative for chest tightness and shortness of breath.   Cardiovascular: Negative for chest pain.  Gastrointestinal: Negative for abdominal pain, diarrhea, nausea and vomiting.  Genitourinary: Negative for dysuria and flank pain.  Musculoskeletal: Negative for arthralgias, back pain and neck pain.  Skin: Negative for wound.  Neurological: Negative for dizziness, facial asymmetry, speech difficulty, weakness, numbness and headaches.    Physical Exam Updated Vital Signs BP 137/67   Pulse 76   Temp (!) 95.9 F (35.5 C) (Oral)   Resp 16   Ht 5\' 7"  (1.702 m)   Wt 96.2 kg   LMP  (LMP Unknown)    SpO2 100%   BMI 33.20 kg/m   Physical Exam Vitals and nursing note reviewed.  Constitutional:      Appearance: She is not ill-appearing or toxic-appearing.  HENT:     Head:     Comments: Patient has focal area of tenderness and ecchymosis with hematoma of the right periorbital region.  No bony deformities.    Mouth/Throat:     Mouth: Mucous membranes are moist.     Pharynx: Oropharynx is clear.  Eyes:     Extraocular Movements: Extraocular movements intact.     Conjunctiva/sclera: Conjunctivae normal.     Pupils: Pupils are equal, round, and reactive to light.  Cardiovascular:     Rate and Rhythm: Normal rate and regular rhythm.     Pulses: Normal pulses.  Pulmonary:     Breath sounds: Normal breath sounds.  Chest:     Chest wall: No tenderness.  Abdominal:     General: There is no distension.     Palpations: Abdomen is soft.     Tenderness: There is no abdominal tenderness. There is no guarding.  Musculoskeletal:        General: No swelling or signs of injury.     Cervical back: Normal range of motion. No tenderness.  Skin:    Capillary Refill: Capillary refill takes less than 2 seconds.     Findings: No rash.  Neurological:     Mental Status: She is alert.     Sensory: Sensation is intact.     Motor: No weakness, tremor or pronator drift.     Comments: Speech is clear, but patient is confused about place and time.  She is oriented to person only. CN II-XII grossly intact.  No pronator drift.  Grip strengths are strong and symmetrical bilaterally.  Follows commands appropriately.     ED Results / Procedures / Treatments   Labs (all labs ordered are listed, but only abnormal results are displayed) Labs Reviewed  CBC WITH DIFFERENTIAL/PLATELET - Abnormal; Notable for the following components:      Result Value   RBC 5.49 (*)    Hemoglobin 16.1 (*)    HCT 48.6 (*)    All other components within normal limits  CK - Abnormal; Notable for the following components:    Total CK 383 (*)    All other components within normal limits  RESPIRATORY PANEL BY RT PCR (FLU A&B, COVID)  COMPREHENSIVE METABOLIC PANEL  PROTIME-INR  URINALYSIS, ROUTINE W REFLEX MICROSCOPIC  TROPONIN I (HIGH SENSITIVITY)  TROPONIN I (HIGH SENSITIVITY)    EKG EKG Interpretation  Date/Time:  Tuesday February 01 2020 16:50:48 EST Ventricular Rate:  72 PR Interval:    QRS Duration: 135  QT Interval:  452 QTC Calculation: 495 R Axis:   -11 Text Interpretation: Sinus rhythm LVH with secondary repolarization abnormality Borderline prolonged QT interval Artifact in lead(s) I aVR aVL Poor data quality in current ECG precludes serial comparison Confirmed by Meridee Score [phone]) on 02/01/2020 5:27:24 PM   Radiology CT Head Wo Contrast  Result Date: 02/01/2020 CLINICAL DATA:  The patient was found down after a fall today. Facial trauma. Altered mental status. EXAM: CT HEAD WITHOUT CONTRAST CT MAXILLOFACIAL WITHOUT CONTRAST CT CERVICAL SPINE WITHOUT CONTRAST TECHNIQUE: Multidetector CT imaging of the head, cervical spine, and maxillofacial structures were performed using the standard protocol without intravenous contrast. Multiplanar CT image reconstructions of the cervical spine and maxillofacial structures were also generated. COMPARISON:  None. FINDINGS: CT HEAD FINDINGS Brain: No evidence of acute infarction, hemorrhage, hydrocephalus, extra-axial collection or mass lesion/mass effect. The patient has multiple old infarcts including in the right posterior frontal region as well as multiple infarcts in the basal ganglia bilaterally, more prominent on the left than the right. Vascular: No hyperdense vessel or unexpected calcification. Skull: Normal. Negative for fracture or focal lesion. Other: None CT MAXILLOFACIAL FINDINGS Osseous: No fracture or mandibular dislocation. No destructive process. Orbits: Soft tissue edema or bruising at the superior and lateral aspects of the right orbit with a 11  mm hematoma at the lateral aspect of the superior orbital rim. Sinuses: No acute abnormality. Retention cysts in both maxillary sinuses. Slight mucosal thickening in multiple ethmoid air cells. Soft tissues: Soft tissue contusion around the right orbit with a small subcutaneous hematoma at the superolateral aspect of the right orbit. CT CERVICAL SPINE FINDINGS Alignment: Straightening of the lower cervical lordosis. 2 mm spondylolisthesis of C7 on T1 due to facet arthritis. Skull base and vertebrae: No acute fracture. No primary bone lesion or focal pathologic process. Soft tissues and spinal canal: No prevertebral fluid or swelling. No visible canal hematoma. Disc levels: C2-3: No disc bulging or protrusion. Severe left facet arthritis. Mild right facet arthritis. No foraminal stenosis. C3-4: Normal disc. Moderate right facet arthritis. No foraminal stenosis. C4-5: Slight disc space narrowing. Small central disc bulge with accompanying osteophytes without neural impingement. Moderate right facet arthritis. No foraminal stenosis. C5-6: Disc space narrowing. Broad-based disc osteophyte complex. No facet arthritis. No significant foraminal stenosis. C6-7: Disc space narrowing. Disc osteophyte complex asymmetric to the left with narrowing of the left lateral recess and slight narrowing of the left neural foramen. No facet arthritis. C7-T1: 2 mm spondylolisthesis due to severe left and mild right facet arthritis. No disc bulging or protrusion. Moderately severe left foraminal stenosis. T1-2 and T2-3: No significant abnormalities. Upper chest: No acute abnormalities. Other: None IMPRESSION: 1. No acute intracranial abnormality. Multiple old infarcts. 2. No acute abnormality of the cervical spine. 3. Soft tissue contusion around the right orbit with a small subcutaneous hematoma at the superolateral aspect of the right orbit. 4. No other acute abnormality of the maxillofacial structures. Electronically Signed   By: Francene Boyers M.D.   On: 02/01/2020 19:27   CT Cervical Spine Wo Contrast  Result Date: 02/01/2020 CLINICAL DATA:  The patient was found down after a fall today. Facial trauma. Altered mental status. EXAM: CT HEAD WITHOUT CONTRAST CT MAXILLOFACIAL WITHOUT CONTRAST CT CERVICAL SPINE WITHOUT CONTRAST TECHNIQUE: Multidetector CT imaging of the head, cervical spine, and maxillofacial structures were performed using the standard protocol without intravenous contrast. Multiplanar CT image reconstructions of the cervical spine and maxillofacial structures were also generated. COMPARISON:  None. FINDINGS: CT HEAD FINDINGS Brain: No evidence of acute infarction, hemorrhage, hydrocephalus, extra-axial collection or mass lesion/mass effect. The patient has multiple old infarcts including in the right posterior frontal region as well as multiple infarcts in the basal ganglia bilaterally, more prominent on the left than the right. Vascular: No hyperdense vessel or unexpected calcification. Skull: Normal. Negative for fracture or focal lesion. Other: None CT MAXILLOFACIAL FINDINGS Osseous: No fracture or mandibular dislocation. No destructive process. Orbits: Soft tissue edema or bruising at the superior and lateral aspects of the right orbit with a 11 mm hematoma at the lateral aspect of the superior orbital rim. Sinuses: No acute abnormality. Retention cysts in both maxillary sinuses. Slight mucosal thickening in multiple ethmoid air cells. Soft tissues: Soft tissue contusion around the right orbit with a small subcutaneous hematoma at the superolateral aspect of the right orbit. CT CERVICAL SPINE FINDINGS Alignment: Straightening of the lower cervical lordosis. 2 mm spondylolisthesis of C7 on T1 due to facet arthritis. Skull base and vertebrae: No acute fracture. No primary bone lesion or focal pathologic process. Soft tissues and spinal canal: No prevertebral fluid or swelling. No visible canal hematoma. Disc levels: C2-3: No  disc bulging or protrusion. Severe left facet arthritis. Mild right facet arthritis. No foraminal stenosis. C3-4: Normal disc. Moderate right facet arthritis. No foraminal stenosis. C4-5: Slight disc space narrowing. Small central disc bulge with accompanying osteophytes without neural impingement. Moderate right facet arthritis. No foraminal stenosis. C5-6: Disc space narrowing. Broad-based disc osteophyte complex. No facet arthritis. No significant foraminal stenosis. C6-7: Disc space narrowing. Disc osteophyte complex asymmetric to the left with narrowing of the left lateral recess and slight narrowing of the left neural foramen. No facet arthritis. C7-T1: 2 mm spondylolisthesis due to severe left and mild right facet arthritis. No disc bulging or protrusion. Moderately severe left foraminal stenosis. T1-2 and T2-3: No significant abnormalities. Upper chest: No acute abnormalities. Other: None IMPRESSION: 1. No acute intracranial abnormality. Multiple old infarcts. 2. No acute abnormality of the cervical spine. 3. Soft tissue contusion around the right orbit with a small subcutaneous hematoma at the superolateral aspect of the right orbit. 4. No other acute abnormality of the maxillofacial structures. Electronically Signed   By: Francene BoyersJames  Maxwell M.D.   On: 02/01/2020 19:27   DG Chest Portable 1 View  Result Date: 02/01/2020 CLINICAL DATA:  Un witnessed fall, found down EXAM: PORTABLE CHEST 1 VIEW COMPARISON:  11/25/2017 FINDINGS: Single frontal view of the chest demonstrates a stable cardiac silhouette. Atherosclerosis of the aortic arch again noted. No airspace disease, effusion, or pneumothorax. Chronic postsurgical changes left humerus. No acute bony abnormalities. IMPRESSION: 1. No acute intrathoracic process. Electronically Signed   By: Sharlet SalinaMichael  Brown M.D.   On: 02/01/2020 19:26   DG Hips Bilat W or Wo Pelvis 3-4 Views  Result Date: 02/01/2020 CLINICAL DATA:  Found down, bruising and swelling EXAM: DG  HIP (WITH OR WITHOUT PELVIS) 3-4V BILAT COMPARISON:  03/13/2018 FINDINGS: Frontal view of the pelvis as well as frontal and frogleg lateral views of both hips are obtained. There are no acute displaced fractures. The hips are well aligned. Mild symmetrical hip osteoarthritis. Moderate lower lumbar spondylosis. Sacroiliac joints are normal. IMPRESSION: 1. Degenerative changes of the hips and lumbar spine. 2. No acute fracture. Electronically Signed   By: Sharlet SalinaMichael  Brown M.D.   On: 02/01/2020 19:28   CT Maxillofacial Wo Contrast  Result Date: 02/01/2020 CLINICAL DATA:  The patient was found down after a  fall today. Facial trauma. Altered mental status. EXAM: CT HEAD WITHOUT CONTRAST CT MAXILLOFACIAL WITHOUT CONTRAST CT CERVICAL SPINE WITHOUT CONTRAST TECHNIQUE: Multidetector CT imaging of the head, cervical spine, and maxillofacial structures were performed using the standard protocol without intravenous contrast. Multiplanar CT image reconstructions of the cervical spine and maxillofacial structures were also generated. COMPARISON:  None. FINDINGS: CT HEAD FINDINGS Brain: No evidence of acute infarction, hemorrhage, hydrocephalus, extra-axial collection or mass lesion/mass effect. The patient has multiple old infarcts including in the right posterior frontal region as well as multiple infarcts in the basal ganglia bilaterally, more prominent on the left than the right. Vascular: No hyperdense vessel or unexpected calcification. Skull: Normal. Negative for fracture or focal lesion. Other: None CT MAXILLOFACIAL FINDINGS Osseous: No fracture or mandibular dislocation. No destructive process. Orbits: Soft tissue edema or bruising at the superior and lateral aspects of the right orbit with a 11 mm hematoma at the lateral aspect of the superior orbital rim. Sinuses: No acute abnormality. Retention cysts in both maxillary sinuses. Slight mucosal thickening in multiple ethmoid air cells. Soft tissues: Soft tissue  contusion around the right orbit with a small subcutaneous hematoma at the superolateral aspect of the right orbit. CT CERVICAL SPINE FINDINGS Alignment: Straightening of the lower cervical lordosis. 2 mm spondylolisthesis of C7 on T1 due to facet arthritis. Skull base and vertebrae: No acute fracture. No primary bone lesion or focal pathologic process. Soft tissues and spinal canal: No prevertebral fluid or swelling. No visible canal hematoma. Disc levels: C2-3: No disc bulging or protrusion. Severe left facet arthritis. Mild right facet arthritis. No foraminal stenosis. C3-4: Normal disc. Moderate right facet arthritis. No foraminal stenosis. C4-5: Slight disc space narrowing. Small central disc bulge with accompanying osteophytes without neural impingement. Moderate right facet arthritis. No foraminal stenosis. C5-6: Disc space narrowing. Broad-based disc osteophyte complex. No facet arthritis. No significant foraminal stenosis. C6-7: Disc space narrowing. Disc osteophyte complex asymmetric to the left with narrowing of the left lateral recess and slight narrowing of the left neural foramen. No facet arthritis. C7-T1: 2 mm spondylolisthesis due to severe left and mild right facet arthritis. No disc bulging or protrusion. Moderately severe left foraminal stenosis. T1-2 and T2-3: No significant abnormalities. Upper chest: No acute abnormalities. Other: None IMPRESSION: 1. No acute intracranial abnormality. Multiple old infarcts. 2. No acute abnormality of the cervical spine. 3. Soft tissue contusion around the right orbit with a small subcutaneous hematoma at the superolateral aspect of the right orbit. 4. No other acute abnormality of the maxillofacial structures. Electronically Signed   By: Francene Boyers M.D.   On: 02/01/2020 19:27    Procedures Procedures (including critical care time)  Medications Ordered in ED Medications - No data to display  ED Course  I have reviewed the triage vital signs and  the nursing notes.  Pertinent labs & imaging results that were available during my care of the patient were reviewed by me and considered in my medical decision making (see chart for details).  Clinical Course as of Feb 01 1956  Tue Feb 01, 2020  [id number] 79 year old female status post fall head injury today now with increased confusion.  Son who brought her in such as talking about seeing dog some different strange things.  She had bruising of her right eye but her imaging does not show any significant abnormalities.   [MB]    Clinical Course User Index [MB] Terrilee Files, MD   MDM Rules/Calculators/A&P  Patient here with confusion secondary to a fall that occurred this morning.  No focal neuro deficits.  Patient also seen by Dr. Charm Barges and care plan discussed.  On recheck, pt is resting comfortably, but remains confused.  Son at bedside   Although CT imaging is negative for acute findings, I feel that pt would benefit from admission and likely need MRI in am.    Consulted hospitalist, Dr. Robb Matar, who agrees to admit.    Final Clinical Impression(s) / ED Diagnoses Final diagnoses:  Fall, initial encounter  Confusion  Injury of head, initial encounter    Rx / DC Orders ED Discharge Orders    None       Rosey Bath 02/01/20 2327    Terrilee Files, MD 02/02/20 1037

## 2020-02-01 NOTE — ED Triage Notes (Signed)
Patient lives alone and had a fall today. Her son states she was on the floor at least 6 hours before being found. Patient has obvious bruising and swelling to her right eye. Patient's son says she is disoriented. Patient is alert to self only. Patient endorses LOC, Not currently taking blood thinners.

## 2020-02-02 ENCOUNTER — Other Ambulatory Visit: Payer: Self-pay

## 2020-02-02 DIAGNOSIS — S060X9A Concussion with loss of consciousness of unspecified duration, initial encounter: Secondary | ICD-10-CM | POA: Diagnosis not present

## 2020-02-02 DIAGNOSIS — R9431 Abnormal electrocardiogram [ECG] [EKG]: Secondary | ICD-10-CM | POA: Diagnosis present

## 2020-02-02 LAB — URINALYSIS, ROUTINE W REFLEX MICROSCOPIC
Bilirubin Urine: NEGATIVE
Glucose, UA: NEGATIVE mg/dL
Ketones, ur: 80 mg/dL — AB
Leukocytes,Ua: NEGATIVE
Nitrite: POSITIVE — AB
Protein, ur: NEGATIVE mg/dL
Specific Gravity, Urine: 1.021 (ref 1.005–1.030)
pH: 5 (ref 5.0–8.0)

## 2020-02-02 LAB — TROPONIN I (HIGH SENSITIVITY): Troponin I (High Sensitivity): 7 ng/L (ref <18)

## 2020-02-02 LAB — MAGNESIUM: Magnesium: 2.1 mg/dL (ref 1.7–2.4)

## 2020-02-02 LAB — PHOSPHORUS: Phosphorus: 3.4 mg/dL (ref 2.5–4.6)

## 2020-02-02 MED ORDER — METOPROLOL TARTRATE 5 MG/5ML IV SOLN
5.0000 mg | Freq: Once | INTRAVENOUS | Status: AC
  Administered 2020-02-02: 02:00:00 5 mg via INTRAVENOUS
  Filled 2020-02-02: qty 5

## 2020-02-02 MED ORDER — LISINOPRIL 5 MG PO TABS
5.0000 mg | ORAL_TABLET | Freq: Every day | ORAL | Status: DC
  Administered 2020-02-03: 5 mg via ORAL
  Filled 2020-02-02: qty 1

## 2020-02-02 NOTE — Evaluation (Signed)
Physical Therapy Evaluation Patient Details Name: Tiffany Everett MRN: [id number] DOB: 04/20/1941 Today's Date: 02/02/2020   History of Present Illness  Tiffany Everett is a 79 y.o. female with medical history significant of colon polyps, hypertension, seasonal allergies who lives alone and is brought to the emergency department after her son found her lying down on the floor at her home.  The son believes that she had spent at least 6 hours lying down on the floor.  The patient stated that she has LOC and remembers that she fell just before passing out, but is only oriented to self and unable to provide further details.  She knows she is in the hospital, but thought that she was in [place] or [place].  She was able to tell me that it was Tuesday after first responding that it was Monday.  She knew that it was February with some difficulty, but could not tell me the year.  She told me that the president was Cecille Rubin, but after reviewing corrected and thinking about it, she said that he was the person that was running against Trump.  She complains of headache, but denies blurred vision, rhinorrhea, sore throat, chest pain, dyspnea, dizziness, nausea, vomiting, abdominal pain, back pain or dysuria.    Clinical Impression  Patient requires increased time for sitting up at bedside and completing functional task requiring occasional verbal/tactile cueing, appears slightly confused when attempting to follow directions, frequent bumping into nearby objects due to drifting to the right when walking and peripheral visual fields appears normal during testing.  Patient tolerated sitting up in chair after therapy - nursing staff notified.  Patient will benefit from continued physical therapy in hospital and recommended venue below to increase strength, balance, endurance for safe ADLs and gait.    Follow Up Recommendations SNF;Supervision for mobility/OOB;Supervision/Assistance - 24 hour    Equipment  Recommendations  None recommended by PT    Recommendations for Other Services       Precautions / Restrictions Precautions Precautions: Fall Restrictions Weight Bearing Restrictions: No      Mobility  Bed Mobility Overal bed mobility: Needs Assistance Bed Mobility: Supine to Sit     Supine to sit: Supervision     General bed mobility comments: increased time, slightly labored movement  Transfers Overall transfer level: Needs assistance Equipment used: None;1 person hand held assist Transfers: Sit to/from Stand;Stand Pivot Transfers Sit to Stand: Min guard Stand pivot transfers: Min guard       General transfer comment: slightly unstead, increased time  Ambulation/Gait Ambulation/Gait assistance: Min assist Gait Distance (Feet): 50 Feet Assistive device: None;1 person hand held assist Gait Pattern/deviations: Decreased step length - right;Decreased step length - left;Decreased stride length;Drifts right/left;Staggering right Gait velocity: near normal   General Gait Details: slighty unsteady cadence with near normal gait velocity, tends to veer to the right frequently bumping into objects requiring assistance to avoid falling  Stairs            Wheelchair Mobility    Modified Rankin (Stroke Patients Only)       Balance Overall balance assessment: Needs assistance Sitting-balance support: Feet supported;No upper extremity supported Sitting balance-Leahy Scale: Good Sitting balance - Comments: seated at EOB   Standing balance support: During functional activity;No upper extremity supported Standing balance-Leahy Scale: Fair Standing balance comment: without AD  Pertinent Vitals/Pain Pain Assessment: No/denies pain    Home Living Family/patient expects to be discharged to:: Private residence Living Arrangements: Alone Available Help at Discharge: Family;Available PRN/intermittently Type of Home: House Home  Access: Stairs to enter Entrance Stairs-Rails: Right;Left;Can reach both Entrance Stairs-Number of Steps: 4 Home Layout: One level Home Equipment: Cane - single point;Walker - 2 wheels;Bedside commode      Prior Function Level of Independence: Independent         Comments: Tourist information centre manager, drives     Higher education careers adviser        Extremity/Trunk Assessment   Upper Extremity Assessment Upper Extremity Assessment: Overall WFL for tasks assessed    Lower Extremity Assessment Lower Extremity Assessment: Generalized weakness    Cervical / Trunk Assessment Cervical / Trunk Assessment: Normal  Communication   Communication: No difficulties  Cognition Arousal/Alertness: Awake/alert Behavior During Therapy: WFL for tasks assessed/performed Overall Cognitive Status: No family/caregiver present to determine baseline cognitive functioning                                 General Comments: A&O x person, place      General Comments      Exercises     Assessment/Plan    PT Assessment Patient needs continued PT services  PT Problem List Decreased activity tolerance;Decreased balance;Decreased mobility;Decreased strength       PT Treatment Interventions Balance training;Gait training;Stair training;Functional mobility training;Therapeutic activities;Therapeutic exercise;Patient/family education    PT Goals (Current goals can be found in the Care Plan section)  Acute Rehab PT Goals Patient Stated Goal: return home with her son to assist PT Goal Formulation: With patient Time For Goal Achievement: 02/16/20 Potential to Achieve Goals: Good    Frequency Min 3X/week   Barriers to discharge        Co-evaluation               AM-PAC PT "6 Clicks" Mobility  Outcome Measure Help needed turning from your back to your side while in a flat bed without using bedrails?: None Help needed moving from lying on your back to sitting on the side of a flat bed  without using bedrails?: A Little Help needed moving to and from a bed to a chair (including a wheelchair)?: A Little Help needed standing up from a chair using your arms (e.g., wheelchair or bedside chair)?: A Little Help needed to walk in hospital room?: A Lot Help needed climbing 3-5 steps with a railing? : A Lot 6 Click Score: 17    End of Session   Activity Tolerance: Patient tolerated treatment well;Patient limited by fatigue Patient left: in chair;with call bell/phone within reach;with chair alarm set Nurse Communication: Mobility status PT Visit Diagnosis: Other abnormalities of gait and mobility (R26.89);Muscle weakness (generalized) (M62.81);Unsteadiness on feet (R26.81)    Time: 6644-0347 PT Time Calculation (min) (ACUTE ONLY): 30 min   Charges:   PT Evaluation $PT Eval Moderate Complexity: 1 Mod PT Treatments $Therapeutic Activity: 23-37 mins        2:34 PM, 02/02/20 Ocie Bob, MPT Physical Therapist with [facility] [phone] office [phone] mobile phone

## 2020-02-02 NOTE — Progress Notes (Signed)
Patient Demographics:    Tiffany Everett, is a 79 y.o. female, DOB - 25-Feb-1941, TZG:[id number]  Admit date - 02/01/2020   Admitting Physician Bobette Mo, MD  Outpatient Primary MD for the patient is Benita Stabile, MD  LOS - 0   Chief Complaint  Patient presents with   Fall        Subjective:    Tiffany Everett today has no fevers, no emesis,  No chest pain, resting comfortably, intermittently confused -Denies headaches or visual disturbance  Assessment  & Plan :    Principal Problem:   Concussion with brief loss of consciousness Active Problems:   Hypertension   Polycythemia   Elevated CK   Borderline prolonged QT interval  Brief Summary:- 79 year old with past medical history relevant for HTN admitted on 02/01/2020 after being found on the floor home with confusional episode/fall/loss of consciousness in the setting of closed head injury  A/p 1) closed head injury with Concussion with brief loss of consciousness --- Patient with PVCs, otherwise no significant arrhythmias on telemetry -Neurochecks without new focal deficits -Imaging studies without acute intracranial abnormalities or acute abnormalities of the cervical spine or significant abnormalities of the maxillofacial structures -Patient does have soft tissue contusion around the right orbit with a small subcutaneous hematoma  2)  Borderline prolonged QT interval -Electrolytes including magnesium WNL, frequent PVCs on telemetry today -Continue to monitor repeat EKG in a.m.   3) Hypertension-stable, Continue lisinopril     4) Polycythemia Likely hemoconcentration. Gentle hydration, follow-up H&H.    5)Elevated CK Gentle IV hydration.,  Repeat CK levels in a.m.   6) generalized weakness/status post fall----PT eval appreciated recommends SNF rehab son is not sure he wants mom to go to SNF rehab  Disposition/Need for  in-Hospital Stay- patient unable to be discharged at this time due to --- physical therapist recommends SNF rehab, patient remains intermittently confused, -Possible discharge to SNF rehab if son is agreeable if confusion improves  Code Status : Full  Family Communication:   Left HIPAA compliant voicemail for patient's son  Consults  :  Na  DVT Prophylaxis  :  - SCDs   Lab Results  Component Value Date   PLT 190 02/01/2020    Inpatient Medications  Scheduled Meds: Continuous Infusions:  sodium chloride 88 mL/hr at 02/02/20 0138   PRN Meds:.acetaminophen **OR** acetaminophen, prochlorperazine    Anti-infectives (From admission, onward)   None        Objective:   Vitals:   02/02/20 0100 02/02/20 0324 02/02/20 0549 02/02/20 1352  BP: (!) 189/114 (!) 141/84 (!) 160/77 (!) 132/59  Pulse: 77  83 86  Resp: 16  18 17   Temp: 97.8 F (36.6 C)  98.5 F (36.9 C)   TempSrc: Oral  Oral   SpO2: 98%  99% 100%  Weight:      Height:        Wt Readings from Last 3 Encounters:  02/01/20 96.2 kg  11/27/17 87.7 kg  04/24/15 86.7 kg     Intake/Output Summary (Last 24 hours) at 02/02/2020 1832 Last data filed at 02/02/2020 1800 Gross per 24 hour  Intake 2053.37 ml  Output --  Net 2053.37 ml     Physical Exam  Gen:- Awake Alert, intermittently confused HEENT:-Right periorbital hematoma Neck-Supple Neck,No JVD,.  Lungs-  CTAB , fair symmetrical air movement CV- S1, S2 normal, irregular but not irregularly irregular Abd-  +ve B.Sounds, Abd Soft, No tenderness,    Extremity/Skin:- No  edema, pedal pulses present  Psych-affect is appropriate, oriented x3, occasional episodes of confusion Neuro-generalized weakness, no new focal deficits, no tremors   Data Review:   Micro Results Recent Results (from the past 240 hour(s))  Respiratory Panel by RT PCR (Flu A&B, Covid) - Nasopharyngeal Swab     Status: None   Collection Time: 02/01/20 11:00 PM   Specimen:  Nasopharyngeal Swab  Result Value Ref Range Status   SARS Coronavirus 2 by RT PCR NEGATIVE NEGATIVE Final    Comment: (NOTE) SARS-CoV-2 target nucleic acids are NOT DETECTED. The SARS-CoV-2 RNA is generally detectable in upper respiratoy specimens during the acute phase of infection. The lowest concentration of SARS-CoV-2 viral copies this assay can detect is 131 copies/mL. A negative result does not preclude SARS-Cov-2 infection and should not be used as the sole basis for treatment or other patient management decisions. A negative result may occur with  improper specimen collection/handling, submission of specimen other than nasopharyngeal swab, presence of viral mutation(s) within the areas targeted by this assay, and inadequate number of viral copies (<131 copies/mL). A negative result must be combined with clinical observations, patient history, and epidemiological information. The expected result is Negative. Fact Sheet for Patients:  https://www.moore.com/ Fact Sheet for Healthcare Providers:  https://www.young.biz/ This test is not yet ap proved or cleared by the [place] FDA and  has been authorized for detection and/or diagnosis of SARS-CoV-2 by FDA under an Emergency Use Authorization (EUA). This EUA will remain  in effect (meaning this test can be used) for the duration of the COVID-19 declaration under Section 564(b)(1) of the Act, 21 U.S.C. section 360bbb-3(b)(1), unless the authorization is terminated or revoked sooner.    Influenza A by PCR NEGATIVE NEGATIVE Final   Influenza B by PCR NEGATIVE NEGATIVE Final    Comment: (NOTE) The Xpert Xpress SARS-CoV-2/FLU/RSV assay is intended as an aid in  the diagnosis of influenza from Nasopharyngeal swab specimens and  should not be used as a sole basis for treatment. Nasal washings and  aspirates are unacceptable for Xpert Xpress SARS-CoV-2/FLU/RSV  testing. Fact Sheet for  Patients: https://www.moore.com/ Fact Sheet for Healthcare Providers: https://www.young.biz/ This test is not yet approved or cleared by the [place] FDA and  has been authorized for detection and/or diagnosis of SARS-CoV-2 by  FDA under an Emergency Use Authorization (EUA). This EUA will remain  in effect (meaning this test can be used) for the duration of the  Covid-19 declaration under Section 564(b)(1) of the Act, 21  U.S.C. section 360bbb-3(b)(1), unless the authorization is  terminated or revoked. Performed at [facility], [address] [place], [place] [address]     Radiology Reports CT Head Wo Contrast  Result Date: 02/01/2020 CLINICAL DATA:  The patient was found down after a fall today. Facial trauma. Altered mental status. EXAM: CT HEAD WITHOUT CONTRAST CT MAXILLOFACIAL WITHOUT CONTRAST CT CERVICAL SPINE WITHOUT CONTRAST TECHNIQUE: Multidetector CT imaging of the head, cervical spine, and maxillofacial structures were performed using the standard protocol without intravenous contrast. Multiplanar CT image reconstructions of the cervical spine and maxillofacial structures were also generated. COMPARISON:  None. FINDINGS: CT HEAD FINDINGS Brain: No evidence of acute infarction, hemorrhage, hydrocephalus, extra-axial collection or mass lesion/mass effect. The  patient has multiple old infarcts including in the right posterior frontal region as well as multiple infarcts in the basal ganglia bilaterally, more prominent on the left than the right. Vascular: No hyperdense vessel or unexpected calcification. Skull: Normal. Negative for fracture or focal lesion. Other: None CT MAXILLOFACIAL FINDINGS Osseous: No fracture or mandibular dislocation. No destructive process. Orbits: Soft tissue edema or bruising at the superior and lateral aspects of the right orbit with a 11 mm hematoma at the lateral aspect of the superior orbital rim. Sinuses: No acute  abnormality. Retention cysts in both maxillary sinuses. Slight mucosal thickening in multiple ethmoid air cells. Soft tissues: Soft tissue contusion around the right orbit with a small subcutaneous hematoma at the superolateral aspect of the right orbit. CT CERVICAL SPINE FINDINGS Alignment: Straightening of the lower cervical lordosis. 2 mm spondylolisthesis of C7 on T1 due to facet arthritis. Skull base and vertebrae: No acute fracture. No primary bone lesion or focal pathologic process. Soft tissues and spinal canal: No prevertebral fluid or swelling. No visible canal hematoma. Disc levels: C2-3: No disc bulging or protrusion. Severe left facet arthritis. Mild right facet arthritis. No foraminal stenosis. C3-4: Normal disc. Moderate right facet arthritis. No foraminal stenosis. C4-5: Slight disc space narrowing. Small central disc bulge with accompanying osteophytes without neural impingement. Moderate right facet arthritis. No foraminal stenosis. C5-6: Disc space narrowing. Broad-based disc osteophyte complex. No facet arthritis. No significant foraminal stenosis. C6-7: Disc space narrowing. Disc osteophyte complex asymmetric to the left with narrowing of the left lateral recess and slight narrowing of the left neural foramen. No facet arthritis. C7-T1: 2 mm spondylolisthesis due to severe left and mild right facet arthritis. No disc bulging or protrusion. Moderately severe left foraminal stenosis. T1-2 and T2-3: No significant abnormalities. Upper chest: No acute abnormalities. Other: None IMPRESSION: 1. No acute intracranial abnormality. Multiple old infarcts. 2. No acute abnormality of the cervical spine. 3. Soft tissue contusion around the right orbit with a small subcutaneous hematoma at the superolateral aspect of the right orbit. 4. No other acute abnormality of the maxillofacial structures. Electronically Signed   By: Lorriane Shire M.D.   On: 02/01/2020 19:27   CT Cervical Spine Wo Contrast  Result  Date: 02/01/2020 CLINICAL DATA:  The patient was found down after a fall today. Facial trauma. Altered mental status. EXAM: CT HEAD WITHOUT CONTRAST CT MAXILLOFACIAL WITHOUT CONTRAST CT CERVICAL SPINE WITHOUT CONTRAST TECHNIQUE: Multidetector CT imaging of the head, cervical spine, and maxillofacial structures were performed using the standard protocol without intravenous contrast. Multiplanar CT image reconstructions of the cervical spine and maxillofacial structures were also generated. COMPARISON:  None. FINDINGS: CT HEAD FINDINGS Brain: No evidence of acute infarction, hemorrhage, hydrocephalus, extra-axial collection or mass lesion/mass effect. The patient has multiple old infarcts including in the right posterior frontal region as well as multiple infarcts in the basal ganglia bilaterally, more prominent on the left than the right. Vascular: No hyperdense vessel or unexpected calcification. Skull: Normal. Negative for fracture or focal lesion. Other: None CT MAXILLOFACIAL FINDINGS Osseous: No fracture or mandibular dislocation. No destructive process. Orbits: Soft tissue edema or bruising at the superior and lateral aspects of the right orbit with a 11 mm hematoma at the lateral aspect of the superior orbital rim. Sinuses: No acute abnormality. Retention cysts in both maxillary sinuses. Slight mucosal thickening in multiple ethmoid air cells. Soft tissues: Soft tissue contusion around the right orbit with a small subcutaneous hematoma at the superolateral aspect of the  right orbit. CT CERVICAL SPINE FINDINGS Alignment: Straightening of the lower cervical lordosis. 2 mm spondylolisthesis of C7 on T1 due to facet arthritis. Skull base and vertebrae: No acute fracture. No primary bone lesion or focal pathologic process. Soft tissues and spinal canal: No prevertebral fluid or swelling. No visible canal hematoma. Disc levels: C2-3: No disc bulging or protrusion. Severe left facet arthritis. Mild right facet  arthritis. No foraminal stenosis. C3-4: Normal disc. Moderate right facet arthritis. No foraminal stenosis. C4-5: Slight disc space narrowing. Small central disc bulge with accompanying osteophytes without neural impingement. Moderate right facet arthritis. No foraminal stenosis. C5-6: Disc space narrowing. Broad-based disc osteophyte complex. No facet arthritis. No significant foraminal stenosis. C6-7: Disc space narrowing. Disc osteophyte complex asymmetric to the left with narrowing of the left lateral recess and slight narrowing of the left neural foramen. No facet arthritis. C7-T1: 2 mm spondylolisthesis due to severe left and mild right facet arthritis. No disc bulging or protrusion. Moderately severe left foraminal stenosis. T1-2 and T2-3: No significant abnormalities. Upper chest: No acute abnormalities. Other: None IMPRESSION: 1. No acute intracranial abnormality. Multiple old infarcts. 2. No acute abnormality of the cervical spine. 3. Soft tissue contusion around the right orbit with a small subcutaneous hematoma at the superolateral aspect of the right orbit. 4. No other acute abnormality of the maxillofacial structures. Electronically Signed   By: Francene Boyers M.D.   On: 02/01/2020 19:27   DG Chest Portable 1 View  Result Date: 02/01/2020 CLINICAL DATA:  Un witnessed fall, found down EXAM: PORTABLE CHEST 1 VIEW COMPARISON:  11/25/2017 FINDINGS: Single frontal view of the chest demonstrates a stable cardiac silhouette. Atherosclerosis of the aortic arch again noted. No airspace disease, effusion, or pneumothorax. Chronic postsurgical changes left humerus. No acute bony abnormalities. IMPRESSION: 1. No acute intrathoracic process. Electronically Signed   By: Sharlet Salina M.D.   On: 02/01/2020 19:26   DG Hips Bilat W or Wo Pelvis 3-4 Views  Result Date: 02/01/2020 CLINICAL DATA:  Found down, bruising and swelling EXAM: DG HIP (WITH OR WITHOUT PELVIS) 3-4V BILAT COMPARISON:  03/13/2018 FINDINGS:  Frontal view of the pelvis as well as frontal and frogleg lateral views of both hips are obtained. There are no acute displaced fractures. The hips are well aligned. Mild symmetrical hip osteoarthritis. Moderate lower lumbar spondylosis. Sacroiliac joints are normal. IMPRESSION: 1. Degenerative changes of the hips and lumbar spine. 2. No acute fracture. Electronically Signed   By: Sharlet Salina M.D.   On: 02/01/2020 19:28   CT Maxillofacial Wo Contrast  Result Date: 02/01/2020 CLINICAL DATA:  The patient was found down after a fall today. Facial trauma. Altered mental status. EXAM: CT HEAD WITHOUT CONTRAST CT MAXILLOFACIAL WITHOUT CONTRAST CT CERVICAL SPINE WITHOUT CONTRAST TECHNIQUE: Multidetector CT imaging of the head, cervical spine, and maxillofacial structures were performed using the standard protocol without intravenous contrast. Multiplanar CT image reconstructions of the cervical spine and maxillofacial structures were also generated. COMPARISON:  None. FINDINGS: CT HEAD FINDINGS Brain: No evidence of acute infarction, hemorrhage, hydrocephalus, extra-axial collection or mass lesion/mass effect. The patient has multiple old infarcts including in the right posterior frontal region as well as multiple infarcts in the basal ganglia bilaterally, more prominent on the left than the right. Vascular: No hyperdense vessel or unexpected calcification. Skull: Normal. Negative for fracture or focal lesion. Other: None CT MAXILLOFACIAL FINDINGS Osseous: No fracture or mandibular dislocation. No destructive process. Orbits: Soft tissue edema or bruising at the superior and  lateral aspects of the right orbit with a 11 mm hematoma at the lateral aspect of the superior orbital rim. Sinuses: No acute abnormality. Retention cysts in both maxillary sinuses. Slight mucosal thickening in multiple ethmoid air cells. Soft tissues: Soft tissue contusion around the right orbit with a small subcutaneous hematoma at the  superolateral aspect of the right orbit. CT CERVICAL SPINE FINDINGS Alignment: Straightening of the lower cervical lordosis. 2 mm spondylolisthesis of C7 on T1 due to facet arthritis. Skull base and vertebrae: No acute fracture. No primary bone lesion or focal pathologic process. Soft tissues and spinal canal: No prevertebral fluid or swelling. No visible canal hematoma. Disc levels: C2-3: No disc bulging or protrusion. Severe left facet arthritis. Mild right facet arthritis. No foraminal stenosis. C3-4: Normal disc. Moderate right facet arthritis. No foraminal stenosis. C4-5: Slight disc space narrowing. Small central disc bulge with accompanying osteophytes without neural impingement. Moderate right facet arthritis. No foraminal stenosis. C5-6: Disc space narrowing. Broad-based disc osteophyte complex. No facet arthritis. No significant foraminal stenosis. C6-7: Disc space narrowing. Disc osteophyte complex asymmetric to the left with narrowing of the left lateral recess and slight narrowing of the left neural foramen. No facet arthritis. C7-T1: 2 mm spondylolisthesis due to severe left and mild right facet arthritis. No disc bulging or protrusion. Moderately severe left foraminal stenosis. T1-2 and T2-3: No significant abnormalities. Upper chest: No acute abnormalities. Other: None IMPRESSION: 1. No acute intracranial abnormality. Multiple old infarcts. 2. No acute abnormality of the cervical spine. 3. Soft tissue contusion around the right orbit with a small subcutaneous hematoma at the superolateral aspect of the right orbit. 4. No other acute abnormality of the maxillofacial structures. Electronically Signed   By: Francene Boyers M.D.   On: 02/01/2020 19:27     CBC Recent Labs  Lab 02/01/20 2009  WBC 9.0  HGB 16.1*  HCT 48.6*  PLT 190  MCV 88.5  MCH 29.3  MCHC 33.1  RDW 13.9  LYMPHSABS 1.2  MONOABS 0.6  EOSABS 0.2  BASOSABS 0.1    Chemistries  Recent Labs  Lab 02/01/20 2009  NA 138  K  3.8  CL 102  CO2 24  GLUCOSE 86  BUN 18  CREATININE 0.86  CALCIUM 9.0  MG 2.1  AST 27  ALT 20  ALKPHOS 83  BILITOT 0.9   ------------------------------------------------------------------------------------------------------------------ No results for input(s): CHOL, HDL, LDLCALC, TRIG, CHOLHDL, LDLDIRECT in the last 72 hours.  No results found for: HGBA1C ------------------------------------------------------------------------------------------------------------------ No results for input(s): TSH, T4TOTAL, T3FREE, THYROIDAB in the last 72 hours.  Invalid input(s): FREET3 ------------------------------------------------------------------------------------------------------------------ No results for input(s): VITAMINB12, FOLATE, FERRITIN, TIBC, IRON, RETICCTPCT in the last 72 hours.  Coagulation profile Recent Labs  Lab 02/01/20 2009  INR 1.0    No results for input(s): DDIMER in the last 72 hours.  Cardiac Enzymes No results for input(s): CKMB, TROPONINI, MYOGLOBIN in the last 168 hours.  Invalid input(s): CK ------------------------------------------------------------------------------------------------------------------ No results found for: BNP   Shon Hale M.D on 02/02/2020 at 6:32 PM  Go to [email or web address] - for contact info  [facility] - Office  [phone]

## 2020-02-02 NOTE — TOC Progression Note (Signed)
Transition of Care [facility]) - Progression Note    Patient Details  Name: Tiffany Everett MRN: [id number] Date of Birth: Apr 23, 1941  Transition of Care [facility]) CM/SW Contact  Elliot Gault, LCSW Phone Number: 02/02/2020, 5:07 PM  Clinical Narrative:      Pt is observation status. PT recommending SNF unless pt becomes less confused and more able to ambulate safely. Discussed with pt's son who states that he is home 24/7 until Monday night when he returns to his night shift job. Son can be with pt during the day once he returns to work though some of that time he would be sleeping. Son states pt is normally very independent, mowing her own yard, etc.  Discussed with MD who states that he is going to keep patient overnight for further observation related to her confusion. Assigned TOC will follow up tomorrow to further assess and assist with dc planning.      Expected Discharge Plan and Services                                                 Social Determinants of Health (SDOH) Interventions    Readmission Risk Interventions No flowsheet data found.

## 2020-02-02 NOTE — Progress Notes (Signed)
Notified by telemetry QTc interval 0.51, [facility]level notified.

## 2020-02-02 NOTE — Plan of Care (Signed)
  Problem: Acute Rehab PT Goals(only PT should resolve) Goal: Pt Will Go Supine/Side To Sit Outcome: Progressing Flowsheets (Taken 02/02/2020 1436) Pt will go Supine/Side to Sit: with modified independence Goal: Patient Will Transfer Sit To/From Stand Outcome: Progressing Flowsheets (Taken 02/02/2020 1436) Patient will transfer sit to/from stand: with supervision Goal: Pt Will Transfer Bed To Chair/Chair To Bed Outcome: Progressing Flowsheets (Taken 02/02/2020 1436) Pt will Transfer Bed to Chair/Chair to Bed: with supervision Goal: Pt Will Ambulate Outcome: Progressing Flowsheets (Taken 02/02/2020 1436) Pt will Ambulate:  > 125 feet  with supervision  with cane Note: SPC if necessary   2:37 PM, 02/02/20 Ocie Bob, MPT Physical Therapist with [facility] 336 [phone] office [phone] mobile phone

## 2020-02-02 NOTE — Progress Notes (Signed)
Pt admitted to room 304. Alert and verbal with confusion. Denies pain at this time. Call bell in reach.

## 2020-02-02 NOTE — Care Management Obs Status (Signed)
MEDICARE OBSERVATION STATUS NOTIFICATION   Patient Details  Name: Tiffany Everett MRN: [id number] Date of Birth: 1941/09/13   Medicare Observation Status Notification Given:  Yes    Corey Harold 02/02/2020, 4:46 PM

## 2020-02-03 DIAGNOSIS — S060X9A Concussion with loss of consciousness of unspecified duration, initial encounter: Secondary | ICD-10-CM | POA: Diagnosis not present

## 2020-02-03 LAB — COMPREHENSIVE METABOLIC PANEL
ALT: 15 U/L (ref 0–44)
AST: 17 U/L (ref 15–41)
Albumin: 3.3 g/dL — ABNORMAL LOW (ref 3.5–5.0)
Alkaline Phosphatase: 63 U/L (ref 38–126)
Anion gap: 9 (ref 5–15)
BUN: 14 mg/dL (ref 8–23)
CO2: 23 mmol/L (ref 22–32)
Calcium: 8.3 mg/dL — ABNORMAL LOW (ref 8.9–10.3)
Chloride: 109 mmol/L (ref 98–111)
Creatinine, Ser: 0.82 mg/dL (ref 0.44–1.00)
GFR calc Af Amer: >60 mL/min (ref >60)
GFR calc non Af Amer: >60 mL/min (ref >60)
Glucose, Bld: 96 mg/dL (ref 70–99)
Potassium: 3.3 mmol/L — ABNORMAL LOW (ref 3.5–5.1)
Sodium: 141 mmol/L (ref 135–145)
Total Bilirubin: 0.7 mg/dL (ref 0.3–1.2)
Total Protein: 5.7 g/dL — ABNORMAL LOW (ref 6.5–8.1)

## 2020-02-03 LAB — CBC
HCT: 43.1 % (ref 36.0–46.0)
Hemoglobin: 14.2 g/dL (ref 12.0–15.0)
MCH: 29.5 pg (ref 26.0–34.0)
MCHC: 32.9 g/dL (ref 30.0–36.0)
MCV: 89.6 fL (ref 80.0–100.0)
Platelets: 175 10*3/uL (ref 150–400)
RBC: 4.81 MIL/uL (ref 3.87–5.11)
RDW: 14.3 % (ref 11.5–15.5)
WBC: 5.3 10*3/uL (ref 4.0–10.5)
nRBC: 0 % (ref 0.0–0.2)

## 2020-02-03 LAB — CK: Total CK: 88 U/L (ref 38–234)

## 2020-02-03 MED ORDER — LISINOPRIL 10 MG PO TABS: 10.0000 mg | ORAL_TABLET | Freq: Every day | ORAL | 3 refills | Status: DC

## 2020-02-03 MED ORDER — ASPIRIN EC 81 MG PO TBEC: 81.0000 mg | DELAYED_RELEASE_TABLET | Freq: Every day | ORAL | 3 refills | Status: DC

## 2020-02-03 MED ORDER — ACETAMINOPHEN 325 MG PO TABS: 650.0000 mg | ORAL_TABLET | Freq: Four times a day (QID) | ORAL | 2 refills | Status: DC | PRN

## 2020-02-03 MED ORDER — POTASSIUM CHLORIDE CRYS ER 20 MEQ PO TBCR
40.0000 meq | EXTENDED_RELEASE_TABLET | ORAL | Status: AC
  Administered 2020-02-03 (×2): 40 meq via ORAL
  Filled 2020-02-03 (×2): qty 2

## 2020-02-03 NOTE — Discharge Instructions (Signed)
1) your blood pressure is running higher so please restart lisinopril 10 mg daily for blood pressure control--- please recheck your blood pressure with the primary care physician in a week or so 2) avoid falls--- you need supervision/standby assist when you walk to avoid falling and injuries

## 2020-02-03 NOTE — TOC Initial Note (Signed)
Transition of Care [facility]) - Initial/Assessment Note    Patient Details  Name: Tiffany Everett MRN: [id number] Date of Birth: 1941-01-23  Transition of Care [facility]) CM/SW Contact:    Leitha Bleak, RN Phone Number: 02/03/2020, 2:07 PM  Clinical Narrative:  Patient admitted with concussion. Patient lives home alone, however she has two son that will be staying with her 24/7 til better. PT is recommending HHPT.  Son agrees, if insurance approves.  Choices given, Alroy Bailiff with [facility] accepted the referral. Patient discharging home today.                    Expected Discharge Plan: Home w Home Health Services Barriers to Discharge: Barriers Resolved   Patient Goals and CMS Choice Patient states their goals for this hospitalization and ongoing recovery are:: to go home. CMS Medicare.gov Compare Post Acute Care list provided to:: Patient Represenative (must comment) Choice offered to / list presented to : Adult Children  Expected Discharge Plan and Services Expected Discharge Plan: Home w Home Health Services       Living arrangements for the past 2 months: Single Family Home Expected Discharge Date: 02/03/20                    HH Arranged: PT HH Agency: [facility] Date HH Agency Contacted: 02/03/20 Time HH Agency Contacted: 1337 Representative spoke with at [facility] Agency: Alroy Bailiff  Prior Living Arrangements/Services Living arrangements for the past 2 months: Single Family Home Lives with:: Self, Other (Comment)(Son will be staying with 24/7 till better.) Patient language and need for interpreter reviewed:: Yes Do you feel safe going back to the place where you live?: Yes      Need for Family Participation in Patient Care: Yes (Comment) Care giver support system in place?: Yes (comment)   Criminal Activity/Legal Involvement Pertinent to Current Situation/Hospitalization: No - Comment as needed  Activities of Daily Living Home  Assistive Devices/Equipment: None ADL Screening (condition at time of admission) Patient's cognitive ability adequate to safely complete daily activities?: Yes Is the patient deaf or have difficulty hearing?: No Does the patient have difficulty seeing, even when wearing glasses/contacts?: No Does the patient have difficulty concentrating, remembering, or making decisions?: Yes Patient able to express need for assistance with ADLs?: Yes Does the patient have difficulty dressing or bathing?: No Independently performs ADLs?: Yes (appropriate for developmental age) Does the patient have difficulty walking or climbing stairs?: Yes Weakness of Legs: Both Weakness of Arms/Hands: None  Permission Sought/Granted        Permission granted to share info w Relationship: SON     Emotional Assessment     Affect (typically observed): Accepting Orientation: : Oriented to Self Alcohol / Substance Use: Not Applicable Psych Involvement: No (comment)  Admission diagnosis:  Confusion [R41.0] Concussion with brief loss of consciousness [S06.0X9A] Injury of head, initial encounter [S09.90XA] Fall, initial encounter [W19.XXXA] Patient Active Problem List   Diagnosis Date Noted  . Borderline prolonged QT interval 02/02/2020  . Concussion with brief loss of consciousness 02/01/2020  . Polycythemia 02/01/2020  . Elevated CK 02/01/2020  . Hypertension   . Hypothermia 11/25/2017  . N&V (nausea and vomiting) 11/25/2017  . History of colonic polyps 04/24/2015   PCP:  Benita Stabile, MD Pharmacy:   [facility] [phone] - [place], [place] - [address]  [place]  [address] Phone: [phone] Fax: [phone]  [facility] [phone] - [place], [place] AT [place] [address] [place] [address] Phone: [phone] Fax: [phone]

## 2020-02-03 NOTE — Discharge Summary (Signed)
Tiffany Everett, is a 79 y.o. female  DOB 1941-06-04  MRN [id number].  Admission date:  02/01/2020  Admitting Physician  Bobette Mo, MD  Discharge Date:  02/03/2020   Primary MD  Benita Stabile, MD  Recommendations for primary care physician for things to follow:   1) your blood pressure is running higher so please restart lisinopril 10 mg daily for blood pressure control--- please recheck your blood pressure with the primary care physician in a week or so 2) avoid falls--- you need supervision/standby assist when you walk to avoid falling and injuries  Admission Diagnosis  Confusion [R41.0] Concussion with brief loss of consciousness [S06.0X9A] Injury of head, initial encounter [S09.90XA] Fall, initial encounter [W19.XXXA]   Discharge Diagnosis  Confusion [R41.0] Concussion with brief loss of consciousness [S06.0X9A] Injury of head, initial encounter [S09.90XA] Fall, initial encounter [W19.XXXA]   Principal Problem:   Concussion with brief loss of consciousness Active Problems:   Hypertension   Polycythemia   Elevated CK   Borderline prolonged QT interval      Past Medical History:  Diagnosis Date  . Colon polyps   . Hypertension   . Seasonal allergies     Past Surgical History:  Procedure Laterality Date  . APPENDECTOMY    . CATARACT EXTRACTION W/PHACO Right 09/13/2013   Procedure: CATARACT EXTRACTION PHACO AND INTRAOCULAR LENS PLACEMENT (IOC);  Surgeon: Susa Simmonds, MD;  Location: [facility];  Service: Ophthalmology;  Laterality: Right;  CDE:3.25  . CATARACT EXTRACTION W/PHACO Left 11/22/2013   Procedure: CATARACT EXTRACTION PHACO AND INTRAOCULAR LENS PLACEMENT (IOC);  Surgeon: Susa Simmonds, MD;  Location: [facility];  Service: Ophthalmology;  Laterality: Left;  CDE: 3.95  . CESAREAN SECTION     x2  . COLONOSCOPY     SLF 2010: sigmoid diverticula, otherwise wnl. Repeat 5 yrs.    . I & D EXTREMITY Left    lateral wrist  . LAPAROTOMY     oopherectomy  . ORIF HUMERUS FRACTURE Left      HPI  from the history and physical done on the day of admission:    HPI: Tiffany Everett is a 79 y.o. female with medical history significant of colon polyps, hypertension, seasonal allergies who lives alone and is brought to the emergency department after her son found her lying down on the floor at her home.  The son believes that she had spent at least 6 hours lying down on the floor.  The patient stated that she has LOC and remembers that she fell just before passing out, but is only oriented to self and unable to provide further details.  She knows she is in the hospital, but thought that she was in [place] or [place].  She was able to tell me that it was Tuesday after first responding that it was Monday.  She knew that it was February with some difficulty, but could not tell me the year.  She told me that the president was Chalmers Guest, but after reviewing corrected and  thinking about it, she said that he was the person that was running against Trump.  She complains of headache, but denies blurred vision, rhinorrhea, sore throat, chest pain, dyspnea, dizziness, nausea, vomiting, abdominal pain, back pain or dysuria.  ED Course: Initial vital signs temperature 95.9 F, pulse 95, respirations 16, blood pressure 154/100 mmHg and O2 sat 98% on room air.  I gave the patient magnesium sulfate in the emergency department due to borderline prolonged QT.  White count is 9.0, hemoglobin 16.1 g/dL and platelets 161.  PT was 12.5 seconds and INR 1.0.  First troponin is normal.  CMP is normal.  Total CK is mildly elevated at 383 units/L.  EKG is sinus rhythm was borderline prolonged QT interval and LVH with secondary repolarization abnormality.  Imaging: Her chest radiograph did not show any acute intrathoracic progress.  Bilateral pelvis showed degenerative changes of the hips and lumbar spine,  but no acute fracture.  CT maxillofacial shows some soft tissue edema and bruising around the right orbit with a small subcutaneous hematoma at the superolateral aspect of the right orbit.  No other acute abnormalities were seen.  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  There was no acute abnormality on C-spine CT.  Please see images and full radiology reports for further detail.   Hospital Course:    Brief Summary:- 79 year old with past medical history relevant for HTN admitted on 02/01/2020 after being found on the floor home with confusional episode/fall/loss of consciousness in the setting of closed head injury  A/p 1)Closed Head injury with Concussion with brief loss of consciousness --- Patient with PVCs, otherwise no significant arrhythmias on telemetry -Neurochecks without new focal deficits -Imaging studies without acute intracranial abnormalities or acute abnormalities of the cervical spine or significant abnormalities of the maxillofacial structures -Patient does have soft tissue contusion around the right orbit with a small subcutaneous hematoma  2)Borderline prolonged QTinterval -Electrolytes including magnesium WNL, frequent PVCs on telemetry today -Continue to monitor repeat EKG in a.m.  3)Hypertension-stable, Continue lisinopril    4)Polycythemia Likely hemoconcentration. Hgb is down to 14.2 from 16.1   5)Elevated CK Gentle IV hydration. CK down to 88 from 383  6)Generalized weakness/status post fall----PT eval appreciated recommends SNF rehab son is not sure he wants mom to go to SNF rehab  Disposition----  - physical therapist recommends SNF rehab, patient much more oriented- --Pt and Son Declines discharge to SNF rehab, -patient has been discharged home with home health  Code Status : Full  Family Communication:    Discussed with patient's son at bedside Consults  :  Na  Discharge Condition:  --Stable  Follow UP  Follow-up Information    Benita Stabile, MD. Schedule an appointment as soon as possible for a visit in 1 week(s).   Specialty: Internal Medicine Why: For recheck and reevaluation and for repeat BP Contact information: [place] [id number]        [facility] Follow [facility].   Specialty: Home Health Services Why:   PT            Diet and Activity recommendation:  As advised  Discharge Instructions    Discharge Instructions    Call MD for:  difficulty breathing, headache or visual disturbances   Complete by: As directed    Call MD for:  extreme fatigue   Complete by: As directed    Call MD for:  persistant dizziness or light-headedness  Complete by: As directed    Call MD for:  persistant nausea and vomiting   Complete by: As directed    Call MD for:  severe uncontrolled pain   Complete by: As directed    Call MD for:  temperature >100.4   Complete by: As directed    Diet - low sodium heart healthy   Complete by: As directed    Discharge instructions   Complete by: As directed    1) your blood pressure is running higher so please restart lisinopril 10 mg daily for blood pressure control--- please recheck your blood pressure with the primary care physician in a week or so 2) avoid falls--- you need supervision/standby assist when you walk to avoid falling and injuries   Increase activity slowly   Complete by: As directed         Discharge Medications     Allergies as of 02/03/2020   No Known Allergies     Medication List    STOP taking these medications   potassium chloride SA 20 MEQ tablet Commonly known as: KLOR-CON     TAKE these medications   acetaminophen 325 MG tablet Commonly known as: TYLENOL Take 2 tablets (650 mg total) by mouth every 6 (six) hours as needed for mild pain (or Fever >/= 101).   aspirin EC 81 MG tablet Take 1 tablet (81 mg total) by mouth daily with breakfast. What changed: when to take  this   lisinopril 10 MG tablet Commonly known as: ZESTRIL Take 1 tablet (10 mg total) by mouth daily. For BP What changed: additional instructions   Melatonin 3 MG Caps Take 3 mg by mouth at bedtime.       Major procedures and Radiology Reports - PLEASE review detailed and final reports for all details, in brief -    CT Head Wo Contrast  Result Date: 02/01/2020 CLINICAL DATA:  The patient was found down after a fall today. Facial trauma. Altered mental status. EXAM: CT HEAD WITHOUT CONTRAST CT MAXILLOFACIAL WITHOUT CONTRAST CT CERVICAL SPINE WITHOUT CONTRAST TECHNIQUE: Multidetector CT imaging of the head, cervical spine, and maxillofacial structures were performed using the standard protocol without intravenous contrast. Multiplanar CT image reconstructions of the cervical spine and maxillofacial structures were also generated. COMPARISON:  None. FINDINGS: CT HEAD FINDINGS Brain: No evidence of acute infarction, hemorrhage, hydrocephalus, extra-axial collection or mass lesion/mass effect. The patient has multiple old infarcts including in the right posterior frontal region as well as multiple infarcts in the basal ganglia bilaterally, more prominent on the left than the right. Vascular: No hyperdense vessel or unexpected calcification. Skull: Normal. Negative for fracture or focal lesion. Other: None CT MAXILLOFACIAL FINDINGS Osseous: No fracture or mandibular dislocation. No destructive process. Orbits: Soft tissue edema or bruising at the superior and lateral aspects of the right orbit with a 11 mm hematoma at the lateral aspect of the superior orbital rim. Sinuses: No acute abnormality. Retention cysts in both maxillary sinuses. Slight mucosal thickening in multiple ethmoid air cells. Soft tissues: Soft tissue contusion around the right orbit with a small subcutaneous hematoma at the superolateral aspect of the right orbit. CT CERVICAL SPINE FINDINGS Alignment: Straightening of the lower  cervical lordosis. 2 mm spondylolisthesis of C7 on T1 due to facet arthritis. Skull base and vertebrae: No acute fracture. No primary bone lesion or focal pathologic process. Soft tissues and spinal canal: No prevertebral fluid or swelling. No visible canal hematoma. Disc levels: C2-3: No disc bulging or  protrusion. Severe left facet arthritis. Mild right facet arthritis. No foraminal stenosis. C3-4: Normal disc. Moderate right facet arthritis. No foraminal stenosis. C4-5: Slight disc space narrowing. Small central disc bulge with accompanying osteophytes without neural impingement. Moderate right facet arthritis. No foraminal stenosis. C5-6: Disc space narrowing. Broad-based disc osteophyte complex. No facet arthritis. No significant foraminal stenosis. C6-7: Disc space narrowing. Disc osteophyte complex asymmetric to the left with narrowing of the left lateral recess and slight narrowing of the left neural foramen. No facet arthritis. C7-T1: 2 mm spondylolisthesis due to severe left and mild right facet arthritis. No disc bulging or protrusion. Moderately severe left foraminal stenosis. T1-2 and T2-3: No significant abnormalities. Upper chest: No acute abnormalities. Other: None IMPRESSION: 1. No acute intracranial abnormality. Multiple old infarcts. 2. No acute abnormality of the cervical spine. 3. Soft tissue contusion around the right orbit with a small subcutaneous hematoma at the superolateral aspect of the right orbit. 4. No other acute abnormality of the maxillofacial structures. Electronically Signed   By: Francene BoyersJames  Maxwell M.D.   On: 02/01/2020 19:27   CT Cervical Spine Wo Contrast  Result Date: 02/01/2020 CLINICAL DATA:  The patient was found down after a fall today. Facial trauma. Altered mental status. EXAM: CT HEAD WITHOUT CONTRAST CT MAXILLOFACIAL WITHOUT CONTRAST CT CERVICAL SPINE WITHOUT CONTRAST TECHNIQUE: Multidetector CT imaging of the head, cervical spine, and maxillofacial structures were  performed using the standard protocol without intravenous contrast. Multiplanar CT image reconstructions of the cervical spine and maxillofacial structures were also generated. COMPARISON:  None. FINDINGS: CT HEAD FINDINGS Brain: No evidence of acute infarction, hemorrhage, hydrocephalus, extra-axial collection or mass lesion/mass effect. The patient has multiple old infarcts including in the right posterior frontal region as well as multiple infarcts in the basal ganglia bilaterally, more prominent on the left than the right. Vascular: No hyperdense vessel or unexpected calcification. Skull: Normal. Negative for fracture or focal lesion. Other: None CT MAXILLOFACIAL FINDINGS Osseous: No fracture or mandibular dislocation. No destructive process. Orbits: Soft tissue edema or bruising at the superior and lateral aspects of the right orbit with a 11 mm hematoma at the lateral aspect of the superior orbital rim. Sinuses: No acute abnormality. Retention cysts in both maxillary sinuses. Slight mucosal thickening in multiple ethmoid air cells. Soft tissues: Soft tissue contusion around the right orbit with a small subcutaneous hematoma at the superolateral aspect of the right orbit. CT CERVICAL SPINE FINDINGS Alignment: Straightening of the lower cervical lordosis. 2 mm spondylolisthesis of C7 on T1 due to facet arthritis. Skull base and vertebrae: No acute fracture. No primary bone lesion or focal pathologic process. Soft tissues and spinal canal: No prevertebral fluid or swelling. No visible canal hematoma. Disc levels: C2-3: No disc bulging or protrusion. Severe left facet arthritis. Mild right facet arthritis. No foraminal stenosis. C3-4: Normal disc. Moderate right facet arthritis. No foraminal stenosis. C4-5: Slight disc space narrowing. Small central disc bulge with accompanying osteophytes without neural impingement. Moderate right facet arthritis. No foraminal stenosis. C5-6: Disc space narrowing. Broad-based  disc osteophyte complex. No facet arthritis. No significant foraminal stenosis. C6-7: Disc space narrowing. Disc osteophyte complex asymmetric to the left with narrowing of the left lateral recess and slight narrowing of the left neural foramen. No facet arthritis. C7-T1: 2 mm spondylolisthesis due to severe left and mild right facet arthritis. No disc bulging or protrusion. Moderately severe left foraminal stenosis. T1-2 and T2-3: No significant abnormalities. Upper chest: No acute abnormalities. Other: None IMPRESSION: 1. No  acute intracranial abnormality. Multiple old infarcts. 2. No acute abnormality of the cervical spine. 3. Soft tissue contusion around the right orbit with a small subcutaneous hematoma at the superolateral aspect of the right orbit. 4. No other acute abnormality of the maxillofacial structures. Electronically Signed   By: Lorriane Shire M.D.   On: 02/01/2020 19:27   DG Chest Portable 1 View  Result Date: 02/01/2020 CLINICAL DATA:  Un witnessed fall, found down EXAM: PORTABLE CHEST 1 VIEW COMPARISON:  11/25/2017 FINDINGS: Single frontal view of the chest demonstrates a stable cardiac silhouette. Atherosclerosis of the aortic arch again noted. No airspace disease, effusion, or pneumothorax. Chronic postsurgical changes left humerus. No acute bony abnormalities. IMPRESSION: 1. No acute intrathoracic process. Electronically Signed   By: Randa Ngo M.D.   On: 02/01/2020 19:26   DG Hips Bilat W or Wo Pelvis 3-4 Views  Result Date: 02/01/2020 CLINICAL DATA:  Found down, bruising and swelling EXAM: DG HIP (WITH OR WITHOUT PELVIS) 3-4V BILAT COMPARISON:  03/13/2018 FINDINGS: Frontal view of the pelvis as well as frontal and frogleg lateral views of both hips are obtained. There are no acute displaced fractures. The hips are well aligned. Mild symmetrical hip osteoarthritis. Moderate lower lumbar spondylosis. Sacroiliac joints are normal. IMPRESSION: 1. Degenerative changes of the hips and  lumbar spine. 2. No acute fracture. Electronically Signed   By: Randa Ngo M.D.   On: 02/01/2020 19:28   CT Maxillofacial Wo Contrast  Result Date: 02/01/2020 CLINICAL DATA:  The patient was found down after a fall today. Facial trauma. Altered mental status. EXAM: CT HEAD WITHOUT CONTRAST CT MAXILLOFACIAL WITHOUT CONTRAST CT CERVICAL SPINE WITHOUT CONTRAST TECHNIQUE: Multidetector CT imaging of the head, cervical spine, and maxillofacial structures were performed using the standard protocol without intravenous contrast. Multiplanar CT image reconstructions of the cervical spine and maxillofacial structures were also generated. COMPARISON:  None. FINDINGS: CT HEAD FINDINGS Brain: No evidence of acute infarction, hemorrhage, hydrocephalus, extra-axial collection or mass lesion/mass effect. The patient has multiple old infarcts including in the right posterior frontal region as well as multiple infarcts in the basal ganglia bilaterally, more prominent on the left than the right. Vascular: No hyperdense vessel or unexpected calcification. Skull: Normal. Negative for fracture or focal lesion. Other: None CT MAXILLOFACIAL FINDINGS Osseous: No fracture or mandibular dislocation. No destructive process. Orbits: Soft tissue edema or bruising at the superior and lateral aspects of the right orbit with a 11 mm hematoma at the lateral aspect of the superior orbital rim. Sinuses: No acute abnormality. Retention cysts in both maxillary sinuses. Slight mucosal thickening in multiple ethmoid air cells. Soft tissues: Soft tissue contusion around the right orbit with a small subcutaneous hematoma at the superolateral aspect of the right orbit. CT CERVICAL SPINE FINDINGS Alignment: Straightening of the lower cervical lordosis. 2 mm spondylolisthesis of C7 on T1 due to facet arthritis. Skull base and vertebrae: No acute fracture. No primary bone lesion or focal pathologic process. Soft tissues and spinal canal: No prevertebral  fluid or swelling. No visible canal hematoma. Disc levels: C2-3: No disc bulging or protrusion. Severe left facet arthritis. Mild right facet arthritis. No foraminal stenosis. C3-4: Normal disc. Moderate right facet arthritis. No foraminal stenosis. C4-5: Slight disc space narrowing. Small central disc bulge with accompanying osteophytes without neural impingement. Moderate right facet arthritis. No foraminal stenosis. C5-6: Disc space narrowing. Broad-based disc osteophyte complex. No facet arthritis. No significant foraminal stenosis. C6-7: Disc space narrowing. Disc osteophyte complex asymmetric to the  left with narrowing of the left lateral recess and slight narrowing of the left neural foramen. No facet arthritis. C7-T1: 2 mm spondylolisthesis due to severe left and mild right facet arthritis. No disc bulging or protrusion. Moderately severe left foraminal stenosis. T1-2 and T2-3: No significant abnormalities. Upper chest: No acute abnormalities. Other: None IMPRESSION: 1. No acute intracranial abnormality. Multiple old infarcts. 2. No acute abnormality of the cervical spine. 3. Soft tissue contusion around the right orbit with a small subcutaneous hematoma at the superolateral aspect of the right orbit. 4. No other acute abnormality of the maxillofacial structures. Electronically Signed   By: Francene BoyersJames  Maxwell M.D.   On: 02/01/2020 19:27    Micro Results    Recent Results (from the past 240 hour(s))  Respiratory Panel by RT PCR (Flu A&B, Covid) - Nasopharyngeal Swab     Status: None   Collection Time: 02/01/20 11:00 PM   Specimen: Nasopharyngeal Swab  Result Value Ref Range Status   SARS Coronavirus 2 by RT PCR NEGATIVE NEGATIVE Final    Comment: (NOTE) SARS-CoV-2 target nucleic acids are NOT DETECTED. The SARS-CoV-2 RNA is generally detectable in upper respiratoy specimens during the acute phase of infection. The lowest concentration of SARS-CoV-2 viral copies this assay can detect is 131  copies/mL. A negative result does not preclude SARS-Cov-2 infection and should not be used as the sole basis for treatment or other patient management decisions. A negative result may occur with  improper specimen collection/handling, submission of specimen other than nasopharyngeal swab, presence of viral mutation(s) within the areas targeted by this assay, and inadequate number of viral copies (<131 copies/mL). A negative result must be combined with clinical observations, patient history, and epidemiological information. The expected result is Negative. Fact Sheet for Patients:  https://www.moore.com/https://www.fda.gov/media/142436/download Fact Sheet for Healthcare Providers:  https://www.young.biz/https://www.fda.gov/media/142435/download This test is not yet ap proved or cleared by the [place] FDA and  has been authorized for detection and/or diagnosis of SARS-CoV-2 by FDA under an Emergency Use Authorization (EUA). This EUA will remain  in effect (meaning this test can be used) for the duration of the COVID-19 declaration under Section 564(b)(1) of the Act, 21 U.S.C. section 360bbb-3(b)(1), unless the authorization is terminated or revoked sooner.    Influenza A by PCR NEGATIVE NEGATIVE Final   Influenza B by PCR NEGATIVE NEGATIVE Final    Comment: (NOTE) The Xpert Xpress SARS-CoV-2/FLU/RSV assay is intended as an aid in  the diagnosis of influenza from Nasopharyngeal swab specimens and  should not be used as a sole basis for treatment. Nasal washings and  aspirates are unacceptable for Xpert Xpress SARS-CoV-2/FLU/RSV  testing. Fact Sheet for Patients: https://www.moore.com/https://www.fda.gov/media/142436/download Fact Sheet for Healthcare Providers: https://www.young.biz/https://www.fda.gov/media/142435/download This test is not yet approved or cleared by the [place] FDA and  has been authorized for detection and/or diagnosis of SARS-CoV-2 by  FDA under an Emergency Use Authorization (EUA). This EUA will remain  in effect (meaning this test can  be used) for the duration of the  Covid-19 declaration under Section 564(b)(1) of the Act, 21  U.S.C. section 360bbb-3(b)(1), unless the authorization is  terminated or revoked. Performed at [facility], [address] [place], [place] [address]        Today   Subjective    Tiffany CoasterBeverly Dunklee today has no new complaints -Ambulating without dizziness or dyspnea on exertion -Patient son at bedside, patient's son states that patient is pretty much back to baseline cognitively  Patient has been seen and examined prior to discharge   Objective   Blood pressure (!) 170/76, pulse 66, temperature 97.8 F (36.6 C), resp. rate 16, height 5\' 7"  (1.702 m), weight 96.2 kg, SpO2 97 %.   Intake/Output Summary (Last 24 hours) at 02/03/2020 1502 Last data filed at 02/03/2020 0439 Gross per 24 hour  Intake 1035.93 ml  Output 400 ml  Net 635.93 ml    Exam Gen:- Awake Alert, no acute distress  HEENT:-Right periorbital ecchymosis/resolving, No sclera icterus Neck-Supple Neck,No JVD,.  Lungs-  CTAB , good air movement bilaterally  CV- S1, S2 normal, regular Abd-  +ve B.Sounds, Abd Soft, No tenderness,    Extremity/Skin:- No  edema,   good pulses Psych-affect is appropriate, oriented x3 Neuro-no new focal deficits, no tremors    Data Review   CBC w Diff:  Lab Results  Component Value Date   WBC 5.3 02/03/2020   HGB 14.2 02/03/2020   HCT 43.1 02/03/2020   PLT 175 02/03/2020   LYMPHOPCT 13 02/01/2020   MONOPCT 7 02/01/2020   EOSPCT 2 02/01/2020   BASOPCT 1 02/01/2020    CMP:  Lab Results  Component Value Date   NA 141 02/03/2020   K 3.3 (L) 02/03/2020   CL 109 02/03/2020   CO2 23 02/03/2020   BUN 14 02/03/2020   CREATININE 0.82 02/03/2020   PROT 5.7 (L) 02/03/2020   ALBUMIN 3.3 (L) 02/03/2020   BILITOT 0.7 02/03/2020   ALKPHOS 63 02/03/2020   AST 17 02/03/2020   ALT 15 02/03/2020  .   Total Discharge time is about 33 minutes  Shon Hale M.D on  02/03/2020 at 3:02 PM  Go to [email or web address] -  for contact info  [facility] - Office  [phone]

## 2020-02-04 DIAGNOSIS — Z9181 History of falling: Secondary | ICD-10-CM | POA: Diagnosis not present

## 2020-02-04 DIAGNOSIS — J302 Other seasonal allergic rhinitis: Secondary | ICD-10-CM | POA: Diagnosis not present

## 2020-02-04 DIAGNOSIS — S060X9D Concussion with loss of consciousness of unspecified duration, subsequent encounter: Secondary | ICD-10-CM | POA: Diagnosis not present

## 2020-02-04 DIAGNOSIS — I10 Essential (primary) hypertension: Secondary | ICD-10-CM | POA: Diagnosis not present

## 2020-02-04 DIAGNOSIS — Z9182 Personal history of military deployment: Secondary | ICD-10-CM | POA: Diagnosis not present

## 2020-02-04 DIAGNOSIS — D751 Secondary polycythemia: Secondary | ICD-10-CM | POA: Diagnosis not present

## 2020-02-04 DIAGNOSIS — Z7982 Long term (current) use of aspirin: Secondary | ICD-10-CM | POA: Diagnosis not present

## 2020-02-04 DIAGNOSIS — W19XXXD Unspecified fall, subsequent encounter: Secondary | ICD-10-CM | POA: Diagnosis not present

## 2020-02-08 DIAGNOSIS — I10 Essential (primary) hypertension: Secondary | ICD-10-CM | POA: Diagnosis not present

## 2020-02-08 DIAGNOSIS — Z9181 History of falling: Secondary | ICD-10-CM | POA: Diagnosis not present

## 2020-02-08 DIAGNOSIS — Z7982 Long term (current) use of aspirin: Secondary | ICD-10-CM | POA: Diagnosis not present

## 2020-02-08 DIAGNOSIS — D751 Secondary polycythemia: Secondary | ICD-10-CM | POA: Diagnosis not present

## 2020-02-08 DIAGNOSIS — W19XXXD Unspecified fall, subsequent encounter: Secondary | ICD-10-CM | POA: Diagnosis not present

## 2020-02-08 DIAGNOSIS — S060X9D Concussion with loss of consciousness of unspecified duration, subsequent encounter: Secondary | ICD-10-CM | POA: Diagnosis not present

## 2020-02-08 DIAGNOSIS — J302 Other seasonal allergic rhinitis: Secondary | ICD-10-CM | POA: Diagnosis not present

## 2020-02-11 DIAGNOSIS — S060X9A Concussion with loss of consciousness of unspecified duration, initial encounter: Secondary | ICD-10-CM | POA: Diagnosis not present

## 2020-02-11 DIAGNOSIS — R55 Syncope and collapse: Secondary | ICD-10-CM | POA: Diagnosis not present

## 2020-02-11 DIAGNOSIS — R410 Disorientation, unspecified: Secondary | ICD-10-CM | POA: Diagnosis not present

## 2020-02-11 DIAGNOSIS — Z0489 Encounter for examination and observation for other specified reasons: Secondary | ICD-10-CM | POA: Diagnosis not present

## 2020-02-11 DIAGNOSIS — I498 Other specified cardiac arrhythmias: Secondary | ICD-10-CM | POA: Diagnosis not present

## 2020-02-11 DIAGNOSIS — R413 Other amnesia: Secondary | ICD-10-CM | POA: Diagnosis not present

## 2020-02-14 DIAGNOSIS — S060X9D Concussion with loss of consciousness of unspecified duration, subsequent encounter: Secondary | ICD-10-CM | POA: Diagnosis not present

## 2020-02-14 DIAGNOSIS — J302 Other seasonal allergic rhinitis: Secondary | ICD-10-CM | POA: Diagnosis not present

## 2020-02-14 DIAGNOSIS — Z7982 Long term (current) use of aspirin: Secondary | ICD-10-CM | POA: Diagnosis not present

## 2020-02-14 DIAGNOSIS — D751 Secondary polycythemia: Secondary | ICD-10-CM | POA: Diagnosis not present

## 2020-02-14 DIAGNOSIS — Z9181 History of falling: Secondary | ICD-10-CM | POA: Diagnosis not present

## 2020-02-14 DIAGNOSIS — I10 Essential (primary) hypertension: Secondary | ICD-10-CM | POA: Diagnosis not present

## 2020-02-14 DIAGNOSIS — W19XXXD Unspecified fall, subsequent encounter: Secondary | ICD-10-CM | POA: Diagnosis not present

## 2020-02-16 DIAGNOSIS — W19XXXD Unspecified fall, subsequent encounter: Secondary | ICD-10-CM | POA: Diagnosis not present

## 2020-02-16 DIAGNOSIS — Z7982 Long term (current) use of aspirin: Secondary | ICD-10-CM | POA: Diagnosis not present

## 2020-02-16 DIAGNOSIS — Z9181 History of falling: Secondary | ICD-10-CM | POA: Diagnosis not present

## 2020-02-16 DIAGNOSIS — J302 Other seasonal allergic rhinitis: Secondary | ICD-10-CM | POA: Diagnosis not present

## 2020-02-16 DIAGNOSIS — S060X9D Concussion with loss of consciousness of unspecified duration, subsequent encounter: Secondary | ICD-10-CM | POA: Diagnosis not present

## 2020-02-16 DIAGNOSIS — D751 Secondary polycythemia: Secondary | ICD-10-CM | POA: Diagnosis not present

## 2020-02-16 DIAGNOSIS — I10 Essential (primary) hypertension: Secondary | ICD-10-CM | POA: Diagnosis not present

## 2020-02-22 DIAGNOSIS — D751 Secondary polycythemia: Secondary | ICD-10-CM | POA: Diagnosis not present

## 2020-02-22 DIAGNOSIS — Z7982 Long term (current) use of aspirin: Secondary | ICD-10-CM | POA: Diagnosis not present

## 2020-02-22 DIAGNOSIS — S060X9D Concussion with loss of consciousness of unspecified duration, subsequent encounter: Secondary | ICD-10-CM | POA: Diagnosis not present

## 2020-02-22 DIAGNOSIS — I10 Essential (primary) hypertension: Secondary | ICD-10-CM | POA: Diagnosis not present

## 2020-02-22 DIAGNOSIS — Z111 Encounter for screening for respiratory tuberculosis: Secondary | ICD-10-CM | POA: Diagnosis not present

## 2020-02-22 DIAGNOSIS — Z9181 History of falling: Secondary | ICD-10-CM | POA: Diagnosis not present

## 2020-02-22 DIAGNOSIS — W19XXXD Unspecified fall, subsequent encounter: Secondary | ICD-10-CM | POA: Diagnosis not present

## 2020-02-22 DIAGNOSIS — J302 Other seasonal allergic rhinitis: Secondary | ICD-10-CM | POA: Diagnosis not present

## 2020-02-24 ENCOUNTER — Ambulatory Visit: Payer: Medicare HMO | Admitting: Neurology

## 2020-02-25 DIAGNOSIS — W19XXXD Unspecified fall, subsequent encounter: Secondary | ICD-10-CM | POA: Diagnosis not present

## 2020-02-25 DIAGNOSIS — Z9181 History of falling: Secondary | ICD-10-CM | POA: Diagnosis not present

## 2020-02-25 DIAGNOSIS — S060X9D Concussion with loss of consciousness of unspecified duration, subsequent encounter: Secondary | ICD-10-CM | POA: Diagnosis not present

## 2020-02-25 DIAGNOSIS — I10 Essential (primary) hypertension: Secondary | ICD-10-CM | POA: Diagnosis not present

## 2020-02-25 DIAGNOSIS — Z7982 Long term (current) use of aspirin: Secondary | ICD-10-CM | POA: Diagnosis not present

## 2020-02-25 DIAGNOSIS — D751 Secondary polycythemia: Secondary | ICD-10-CM | POA: Diagnosis not present

## 2020-02-25 DIAGNOSIS — J302 Other seasonal allergic rhinitis: Secondary | ICD-10-CM | POA: Diagnosis not present

## 2020-02-29 DIAGNOSIS — J302 Other seasonal allergic rhinitis: Secondary | ICD-10-CM | POA: Diagnosis not present

## 2020-02-29 DIAGNOSIS — I10 Essential (primary) hypertension: Secondary | ICD-10-CM | POA: Diagnosis not present

## 2020-02-29 DIAGNOSIS — W19XXXD Unspecified fall, subsequent encounter: Secondary | ICD-10-CM | POA: Diagnosis not present

## 2020-02-29 DIAGNOSIS — S060X9D Concussion with loss of consciousness of unspecified duration, subsequent encounter: Secondary | ICD-10-CM | POA: Diagnosis not present

## 2020-02-29 DIAGNOSIS — Z9181 History of falling: Secondary | ICD-10-CM | POA: Diagnosis not present

## 2020-02-29 DIAGNOSIS — D751 Secondary polycythemia: Secondary | ICD-10-CM | POA: Diagnosis not present

## 2020-02-29 DIAGNOSIS — Z7982 Long term (current) use of aspirin: Secondary | ICD-10-CM | POA: Diagnosis not present

## 2020-03-01 ENCOUNTER — Encounter: Payer: Self-pay | Admitting: Cardiovascular Disease

## 2020-03-01 ENCOUNTER — Ambulatory Visit: Payer: Medicare HMO | Admitting: Cardiovascular Disease

## 2020-03-01 ENCOUNTER — Ambulatory Visit (INDEPENDENT_AMBULATORY_CARE_PROVIDER_SITE_OTHER): Payer: Medicare HMO

## 2020-03-01 ENCOUNTER — Other Ambulatory Visit (HOSPITAL_COMMUNITY)
Admission: RE | Admit: 2020-03-01 | Discharge: 2020-03-01 | Disposition: A | Discharge status: Home / Self Care | Payer: Medicare HMO | Source: Ambulatory Visit | Attending: Cardiovascular Disease | Admitting: Cardiovascular Disease

## 2020-03-01 ENCOUNTER — Other Ambulatory Visit: Payer: Self-pay

## 2020-03-01 VITALS — BP 159/84 | HR 82 | Temp 97.5°F | Ht 68.0 in | Wt 176.0 lb

## 2020-03-01 DIAGNOSIS — I493 Ventricular premature depolarization: Secondary | ICD-10-CM | POA: Diagnosis not present

## 2020-03-01 DIAGNOSIS — R55 Syncope and collapse: Secondary | ICD-10-CM

## 2020-03-01 DIAGNOSIS — I10 Essential (primary) hypertension: Secondary | ICD-10-CM | POA: Diagnosis not present

## 2020-03-01 DIAGNOSIS — I499 Cardiac arrhythmia, unspecified: Secondary | ICD-10-CM | POA: Diagnosis not present

## 2020-03-01 DIAGNOSIS — E876 Hypokalemia: Secondary | ICD-10-CM

## 2020-03-01 DIAGNOSIS — R5383 Other fatigue: Secondary | ICD-10-CM | POA: Diagnosis not present

## 2020-03-01 LAB — BASIC METABOLIC PANEL
Anion gap: 11 (ref 5–15)
BUN: 17 mg/dL (ref 8–23)
CO2: 25 mmol/L (ref 22–32)
Calcium: 9.7 mg/dL (ref 8.9–10.3)
Chloride: 104 mmol/L (ref 98–111)
Creatinine, Ser: 1.06 mg/dL — ABNORMAL HIGH (ref 0.44–1.00)
GFR calc Af Amer: 58 mL/min — ABNORMAL LOW (ref >60)
GFR calc non Af Amer: 50 mL/min — ABNORMAL LOW (ref >60)
Glucose, Bld: 99 mg/dL (ref 70–99)
Potassium: 4.3 mmol/L (ref 3.5–5.1)
Sodium: 140 mmol/L (ref 135–145)

## 2020-03-01 NOTE — Patient Instructions (Signed)
Medication Instructions:  Your physician recommends that you continue on your current medications as directed. Please refer to the Current Medication list given to you today.  *If you need a refill on your cardiac medications before your next appointment, please call your pharmacy*   Lab Work: BMET today If you have labs (blood work) drawn today and your tests are completely normal, you will receive your results only by: Marland Kitchen MyChart Message (if you have MyChart) OR . A paper copy in the mail If you have any lab test that is abnormal or we need to change your treatment, we will call you to review the results.   Testing/Procedures: Your physician has requested that you have an echocardiogram. Echocardiography is a painless test that uses sound waves to create images of your heart. It provides your doctor with information about the size and shape of your heart and how well your heart's chambers and valves are working. This procedure takes approximately one hour. There are no restrictions for this procedure.  Your physician has requested that you have a lexiscan myoview. For further information please visit [email or web address] Please follow instruction sheet, as given.  Your physician has recommended that you wear an event monitor ([employer] for 30 days) . Event monitors are medical devices that record the heart's electrical activity. Doctors most often [place] these monitors to diagnose arrhythmias. Arrhythmias are problems with the speed or rhythm of the heartbeat. The monitor is a small, portable device. You can wear one while you do your normal daily activities. This is usually used to diagnose what is causing palpitations/syncope (passing out).    Follow-Up: At [facility], you and your health needs are our priority.  As part of our continuing mission to provide you with exceptional heart care, we have created designated Provider Care Teams.  These Care Teams include your primary Cardiologist  (physician) and Advanced Practice Providers (APPs -  Physician Assistants and Nurse Practitioners) who all work together to provide you with the care you need, when you need it.  We recommend signing up for the patient portal called "MyChart".  Sign up information is provided on this After Visit Summary.  MyChart is used to connect with patients for Virtual Visits (Telemedicine).  Patients are able to view lab/test results, encounter notes, upcoming appointments, etc.  Non-urgent messages can be sent to your provider as well.   To learn more about what you can do with MyChart, go to [email or web address].    Your next appointment:   3 month(s)  The format for your next appointment:   Virtual Visit   Provider:   Prentice Docker, MD   Other Instructions None      Thank you for choosing [facility] !

## 2020-03-01 NOTE — Progress Notes (Signed)
CARDIOLOGY CONSULT NOTE  Patient ID: Tiffany Everett MRN: [id number] DOB/AGE: Sep 25, 1941 79 y.o.  Admit date: (Not on file) Primary Physician: Celene Squibb, MD  Reason for Consultation: Arrhythmia and syncope  HPI: Tiffany Everett is a 79 y.o. female who is being seen today for the evaluation of arrhythmia and syncope at the request of Celene Squibb, MD.   I reviewed hospital records in the EMR as well as records from her PCP.  She was hospitalized in February 2021 as she was found down on the floor by her son and had been down for 6 hours.  She had a syncopal episode.  She was hypertensive upon arrival.  She had a borderline prolonged QT and was given magnesium in the ED.  Total CK was mildly elevated.  Head CT showed no acute intracranial abnormalities.  At her follow-up visit at her PCPs office on 02/11/2020, she was feeling much better with an improvement in mobility and gait.  She has mild intermittent dizziness.  I personally reviewed the ECG performed on 02/01/2020 which demonstrated sinus rhythm with excessive baseline artifact.  There were no sustained arrhythmias but frequent PVCs were seen on telemetry.  Potassium was 3.3 at the time on 02/03/2020. Troponins were normal.  Echocardiogram on 11/27/2017 demonstrated normal LV systolic function, EF 55 to 60%, mild LVH, and grade 1 diastolic dysfunction.  She is here with her son, Tiffany Everett.  Her husband of 60 years passed away in 01/17/20.  She was taking care of him prior to her hospitalization.  Since then she has felt weaker as the day progresses along.  She denies chest pain, palpitations, leg swelling, orthopnea, and shortness of breath.  She had never passed out before.  She uses a cane to ambulate.  She has been cooking this week and even carrying loads of laundry by herself.  She had been drinking nothing but diet Coke prior to her hospitalization but now is drinking several tall glasses of water and a large  tumbler of orange juice daily.  I personally reviewed the ECG performed today which demonstrates sinus rhythm with sinus arrhythmia and frequent PVCs.   No Known Allergies  Current Outpatient Medications  Medication Sig Dispense Refill  . acetaminophen (TYLENOL) 325 MG tablet Take 2 tablets (650 mg total) by mouth every 6 (six) hours as needed for mild pain (or Fever >/= 101). 12 tablet 2  . aspirin EC 81 MG tablet Take 1 tablet (81 mg total) by mouth daily with breakfast. 30 tablet 3  . lisinopril (ZESTRIL) 10 MG tablet Take 1 tablet (10 mg total) by mouth daily. For BP 30 tablet 3   No current facility-administered medications for this visit.    Past Medical History:  Diagnosis Date  . Colon polyps   . Hypertension   . Seasonal allergies     Past Surgical History:  Procedure Laterality Date  . APPENDECTOMY    . CATARACT EXTRACTION W/PHACO Right 09/13/2013   Procedure: CATARACT EXTRACTION PHACO AND INTRAOCULAR LENS PLACEMENT (IOC);  Surgeon: Williams Che, MD;  Location: [facility];  Service: Ophthalmology;  Laterality: Right;  CDE:3.25  . CATARACT EXTRACTION W/PHACO Left 11/22/2013   Procedure: CATARACT EXTRACTION PHACO AND INTRAOCULAR LENS PLACEMENT (IOC);  Surgeon: Williams Che, MD;  Location: [facility];  Service: Ophthalmology;  Laterality: Left;  CDE: 3.95  . CESAREAN SECTION     x2  . COLONOSCOPY     SLF 2010: sigmoid  diverticula, otherwise wnl. Repeat 5 yrs.  . I & D EXTREMITY Left    lateral wrist  . LAPAROTOMY     oopherectomy  . ORIF HUMERUS FRACTURE Left     Social History   Socioeconomic History  . Marital status: Married    Spouse name: Not on file  . Number of children: Not on file  . Years of education: Not on file  . Highest education level: Not on file  Occupational History  . Not on file  Tobacco Use  . Smoking status: Never Smoker  . Smokeless tobacco: Never Used  . Tobacco comment: Never smoked  Substance and Sexual Activity  . Alcohol use:  No    Alcohol/week: 0.0 standard drinks  . Drug use: No  . Sexual activity: Not on file  Other Topics Concern  . Not on file  Social History Narrative  . Not on file   Social Determinants of Health   Financial Resource Strain:   . Difficulty of Paying Living Expenses:   Food Insecurity:   . Worried About Programme researcher, broadcasting/film/video in the Last Year:   . Barista in the Last Year:   Transportation Needs:   . Freight forwarder (Medical):   Marland Kitchen Lack of Transportation (Non-Medical):   Physical Activity:   . Days of Exercise per Week:   . Minutes of Exercise per Session:   Stress:   . Feeling of Stress :   Social Connections:   . Frequency of Communication with Friends and Family:   . Frequency of Social Gatherings with Friends and Family:   . Attends Religious Services:   . Active Member of Clubs or Organizations:   . Attends Banker Meetings:   Marland Kitchen Marital Status:   Intimate Partner Violence:   . Fear of Current or Ex-Partner:   . Emotionally Abused:   Marland Kitchen Physically Abused:   . Sexually Abused:      No family history of premature CAD in 1st degree relatives.  Current Meds  Medication Sig  . acetaminophen (TYLENOL) 325 MG tablet Take 2 tablets (650 mg total) by mouth every 6 (six) hours as needed for mild pain (or Fever >/= 101).  Marland Kitchen aspirin EC 81 MG tablet Take 1 tablet (81 mg total) by mouth daily with breakfast.  . lisinopril (ZESTRIL) 10 MG tablet Take 1 tablet (10 mg total) by mouth daily. For BP      Review of systems complete and found to be negative unless listed above in HPI   Marlyn Corporal, RN was present throughout the entirety of the encounter.  Physical exam Blood pressure (!) 159/84, pulse 82, temperature (!) 97.5 F (36.4 C), height 5\' 8"  (1.727 m). General: NAD Neck: No JVD, no thyromegaly or thyroid nodule.  Lungs: Clear to auscultation bilaterally with normal respiratory effort. CV: Nondisplaced PMI. Regular rate and irregular  rhythm, normal S1/S2, no S3/S4, no murmur.  No peripheral edema.  No carotid bruit.    Abdomen: Soft, nontender, no distention.  Skin: Intact without lesions or rashes.  Neurologic: Alert and oriented x 3.  Psych: Normal affect. Extremities: No clubbing or cyanosis.  HEENT: Normal.   ECG: Most recent ECG reviewed.   Labs: Lab Results  Component Value Date/Time   K 3.3 (L) 02/03/2020 04:11 AM   BUN 14 02/03/2020 04:11 AM   CREATININE 0.82 02/03/2020 04:11 AM   ALT 15 02/03/2020 04:11 AM   HGB 14.2 02/03/2020 04:11 AM  Lipids: No results found for: LDLCALC, LDLDIRECT, CHOL, TRIG, HDL      ASSESSMENT AND PLAN:   1.  Arrhythmia: Frequent PVCs were on telemetry in the setting of hypokalemia with a potassium of 3.3.  She has frequent PVCs today.  I will check a basic metabolic panel.  I will also obtain an echocardiogram to evaluate cardiac structure and function.  She currently denies chest pain, palpitations, and shortness of breath.  I will obtain a 30-day event monitor.  2.  Hypertension: Blood pressure is elevated.  This will need further monitoring.  3.  Syncope: I will obtain a 30-day event monitor she does have sinus arrhythmia and frequent PVCs on today's ECG.  This may have been due to hypovolemia due to excessive caffeinated beverage consumption.  She is now primarily drinking water and orange juice.  4.  Progressive fatigue: This may be due to a natural grief reaction as her husband passed away in 01-21-2021and they had been married for 60 years.  However, given her episode of syncope and her arrhythmia, I want to exclude occult ischemic heart disease.  I will obtain a Lexiscan Myoview.  5.  Hypokalemia: Potassium 3.3 while hospitalized.  She has frequent PVCs today.  I will check a basic metabolic panel.    Disposition: Follow up in 3 months virtual visit  Signed: Prentice Docker, M.D., F.A.C.C.  03/01/2020, 9:23 AM

## 2020-03-02 DIAGNOSIS — W19XXXD Unspecified fall, subsequent encounter: Secondary | ICD-10-CM | POA: Diagnosis not present

## 2020-03-02 DIAGNOSIS — D751 Secondary polycythemia: Secondary | ICD-10-CM | POA: Diagnosis not present

## 2020-03-02 DIAGNOSIS — Z7982 Long term (current) use of aspirin: Secondary | ICD-10-CM | POA: Diagnosis not present

## 2020-03-02 DIAGNOSIS — S060X9D Concussion with loss of consciousness of unspecified duration, subsequent encounter: Secondary | ICD-10-CM | POA: Diagnosis not present

## 2020-03-02 DIAGNOSIS — Z9181 History of falling: Secondary | ICD-10-CM | POA: Diagnosis not present

## 2020-03-02 DIAGNOSIS — J302 Other seasonal allergic rhinitis: Secondary | ICD-10-CM | POA: Diagnosis not present

## 2020-03-02 DIAGNOSIS — I10 Essential (primary) hypertension: Secondary | ICD-10-CM | POA: Diagnosis not present

## 2020-03-03 DIAGNOSIS — R55 Syncope and collapse: Secondary | ICD-10-CM | POA: Diagnosis not present

## 2020-03-03 DIAGNOSIS — J302 Other seasonal allergic rhinitis: Secondary | ICD-10-CM | POA: Diagnosis not present

## 2020-03-03 DIAGNOSIS — I498 Other specified cardiac arrhythmias: Secondary | ICD-10-CM | POA: Diagnosis not present

## 2020-03-03 DIAGNOSIS — I10 Essential (primary) hypertension: Secondary | ICD-10-CM | POA: Diagnosis not present

## 2020-03-03 DIAGNOSIS — H9192 Unspecified hearing loss, left ear: Secondary | ICD-10-CM | POA: Diagnosis not present

## 2020-03-03 DIAGNOSIS — E782 Mixed hyperlipidemia: Secondary | ICD-10-CM | POA: Diagnosis not present

## 2020-03-03 DIAGNOSIS — I499 Cardiac arrhythmia, unspecified: Secondary | ICD-10-CM | POA: Diagnosis not present

## 2020-03-03 DIAGNOSIS — E663 Overweight: Secondary | ICD-10-CM | POA: Diagnosis not present

## 2020-03-03 DIAGNOSIS — D751 Secondary polycythemia: Secondary | ICD-10-CM | POA: Diagnosis not present

## 2020-03-03 DIAGNOSIS — K8020 Calculus of gallbladder without cholecystitis without obstruction: Secondary | ICD-10-CM | POA: Diagnosis not present

## 2020-03-08 DIAGNOSIS — R634 Abnormal weight loss: Secondary | ICD-10-CM | POA: Diagnosis not present

## 2020-03-08 DIAGNOSIS — R413 Other amnesia: Secondary | ICD-10-CM | POA: Diagnosis not present

## 2020-03-08 DIAGNOSIS — E782 Mixed hyperlipidemia: Secondary | ICD-10-CM | POA: Diagnosis not present

## 2020-03-08 DIAGNOSIS — R7301 Impaired fasting glucose: Secondary | ICD-10-CM | POA: Diagnosis not present

## 2020-03-08 DIAGNOSIS — R69 Illness, unspecified: Secondary | ICD-10-CM | POA: Diagnosis not present

## 2020-03-08 DIAGNOSIS — I499 Cardiac arrhythmia, unspecified: Secondary | ICD-10-CM | POA: Diagnosis not present

## 2020-03-08 DIAGNOSIS — N1831 Chronic kidney disease, stage 3a: Secondary | ICD-10-CM | POA: Diagnosis not present

## 2020-03-08 DIAGNOSIS — Z1331 Encounter for screening for depression: Secondary | ICD-10-CM | POA: Diagnosis not present

## 2020-03-08 DIAGNOSIS — R7303 Prediabetes: Secondary | ICD-10-CM | POA: Diagnosis not present

## 2020-03-08 DIAGNOSIS — Z6825 Body mass index (BMI) 25.0-25.9, adult: Secondary | ICD-10-CM | POA: Diagnosis not present

## 2020-03-27 ENCOUNTER — Other Ambulatory Visit: Payer: Self-pay

## 2020-03-27 ENCOUNTER — Ambulatory Visit: Payer: Medicare HMO | Admitting: Neurology

## 2020-03-27 ENCOUNTER — Encounter: Payer: Self-pay | Admitting: Neurology

## 2020-03-27 VITALS — BP 122/80 | HR 78 | Temp 97.1°F | Ht 68.0 in | Wt 178.3 lb

## 2020-03-27 DIAGNOSIS — R413 Other amnesia: Secondary | ICD-10-CM

## 2020-03-27 DIAGNOSIS — R6889 Other general symptoms and signs: Secondary | ICD-10-CM | POA: Diagnosis not present

## 2020-03-27 DIAGNOSIS — S069X9A Unspecified intracranial injury with loss of consciousness of unspecified duration, initial encounter: Secondary | ICD-10-CM

## 2020-03-27 DIAGNOSIS — Z9181 History of falling: Secondary | ICD-10-CM | POA: Diagnosis not present

## 2020-03-27 NOTE — Progress Notes (Signed)
Subjective:    Patient ID: Tiffany Everett is a 79 y.o. female.  HPI     Tiffany Age, MD, PhD [facility] [address], [address] [place], [place] [address] Dear Loma Sousa,  I saw your patient, Tiffany Everett, upon your kind request, neurologic clinic today for initial consultation of her memory loss.  The patient is accompanied by her son, Tiffany Everett, today.  As you know, Tiffany Everett is a 79 year old right-handed woman with an underlying medical history of hypertension, seasonal allergies, recent hospitalization for a fall, prior strokes, and overweight state, who reports problems with her short-term memory since her hospital stay in February.  Her son reports that she has had some improvement in her memory function but essentially has no recollection of her hospital stay or the fall that led to it.  She was likely down on the ground for about 6 to 12 hours. She hit her right side of the head and had a black eye. She has lived alone since her husband passed away in Dec 24, 2022 of this year.  Her 2 sons take turns to stay with her, Tiffany Everett is usually there during the day and Tiffany Everett is usually there overnight.  She has a daughter who lives in [place] and has stayed with her a week at a time.  Her son reports that she has had some staring spells but no twitching or convulsion is noted.  These have improved and were tied in with episodes of confusion.  Her appetite is good, she tries to hydrate well with water.  She reports a history of memory loss in the family affecting her mother.  Both parents lived to be in their 14s.  Her mother was a smoker and also drank alcohol excessively per patient and Tiffany Everett.  The patient is a non-smoker and does not drink alcohol.  She has 1 dog in the household.  She has had some dizziness when standing up quickly.  She has not had any other falls.  She sleeps very soundly.  There is no report of snoring.  She has nocturia about once per average night  and denies morning headaches.  Bedtime is around 10 and rise time around 9.  Her son reports that she has been sleeping more, longer hours since her hospitalization.  I reviewed your office note from 02/11/2020.  She was hospitalized from 02/01/2020 through 02/03/2020.  She had been found down by her son, it was unclear how long she was on the ground and how long she had lost consciousness.  She was confused.  I reviewed the emergency room and hospital records.  She had elevated blood pressure values and polycythemia, deemed mainly secondary to hemoconcentration.  Potassium was low at 3.3.  She had a head CT without contrast and maxillofacial CT without contrast as well as cervical spine CT without contrast on 02/01/2020 and I reviewed the results:   IMPRESSION: 1. No acute intracranial abnormality. Multiple old infarcts. 2. No acute abnormality of the cervical spine. 3. Soft tissue contusion around the right orbit with a small subcutaneous hematoma at the superolateral aspect of the right orbit. 4. No other acute abnormality of the maxillofacial structures.  Initial CK level was mildly elevated at 383 but normalized by the time of discharge to 88.   She saw Dr. Bronson Ing in the interim on 03/01/20 with EKG with frequent PVCs noted. She currently has a heart monitor x 30 days and is scheduled for a Lexiscan and echocardiogram.  Her Past Medical History Is Significant For: Past Medical History:  Diagnosis Date  . Colon polyps   . Hypertension   . Seasonal allergies     Her Past Surgical History Is Significant For: Past Surgical History:  Procedure Laterality Date  . APPENDECTOMY    . CATARACT EXTRACTION W/PHACO Right 09/13/2013   Procedure: CATARACT EXTRACTION PHACO AND INTRAOCULAR LENS PLACEMENT (IOC);  Surgeon: Susa Simmonds, MD;  Location: [facility];  Service: Ophthalmology;  Laterality: Right;  CDE:3.25  . CATARACT EXTRACTION W/PHACO Left 11/22/2013   Procedure: CATARACT EXTRACTION  PHACO AND INTRAOCULAR LENS PLACEMENT (IOC);  Surgeon: Susa Simmonds, MD;  Location: [facility];  Service: Ophthalmology;  Laterality: Left;  CDE: 3.95  . CESAREAN SECTION     x2  . COLONOSCOPY     SLF 2010: sigmoid diverticula, otherwise wnl. Repeat 5 yrs.  . I & D EXTREMITY Left    lateral wrist  . LAPAROTOMY     oopherectomy  . ORIF HUMERUS FRACTURE Left     Her Family History Is Significant For: Family History  Problem Relation Everett of Onset  . Colon cancer Mother 6  . Colon cancer Father 64    Her Social History Is Significant For: Social History   Socioeconomic History  . Marital status: Married    Spouse name: Not on file  . Number of children: Not on file  . Years of education: Not on file  . Highest education level: Not on file  Occupational History  . Not on file  Tobacco Use  . Smoking status: Never Smoker  . Smokeless tobacco: Never Used  . Tobacco comment: Never smoked  Substance and Sexual Activity  . Alcohol use: No    Alcohol/week: 0.0 standard drinks  . Drug use: No  . Sexual activity: Not on file  Other Topics Concern  . Not on file  Social History Narrative  . Not on file   Social Determinants of Health   Financial Resource Strain:   . Difficulty of Paying Living Expenses:   Food Insecurity:   . Worried About Programme researcher, broadcasting/film/video in the Last Year:   . Barista in the Last Year:   Transportation Needs:   . Freight forwarder (Medical):   Marland Kitchen Lack of Transportation (Non-Medical):   Physical Activity:   . Days of Exercise per Week:   . Minutes of Exercise per Session:   Stress:   . Feeling of Stress :   Social Connections:   . Frequency of Communication with Friends and Family:   . Frequency of Social Gatherings with Friends and Family:   . Attends Religious Services:   . Active Member of Clubs or Organizations:   . Attends Banker Meetings:   Marland Kitchen Marital Status:     Her Allergies Are:  No Known Allergies:   Her  Current Medications Are:  Outpatient Encounter Medications as of 03/27/2020  Medication Sig  . acetaminophen (TYLENOL) 325 MG tablet Take 2 tablets (650 mg total) by mouth every 6 (six) hours as needed for mild pain (or Fever >/= 101).  Marland Kitchen aspirin EC 81 MG tablet Take 1 tablet (81 mg total) by mouth daily with breakfast.  . lisinopril (ZESTRIL) 10 MG tablet Take 1 tablet (10 mg total) by mouth daily. For BP   No facility-administered encounter medications on file as of 03/27/2020.  : Review of Systems:  Out of a complete 14 point review of systems, all are  reviewed and negative with the exception of these symptoms as listed below:  Review of Systems  Neurological:       Here to discuss decline in memory. Son reported on 01/11/20 pt was home and fell and hit her head. He sts she laid in the floor for 6-12 hrs. Since the fall pt's memory has not been stable.     Objective:  Neurological Exam  Physical Exam Physical Examination:   Vitals:   03/27/20 1446  BP: 122/80  Pulse: 78  Temp: (!) 97.1 F (36.2 C)    General Examination: The patient is a very pleasant 79 y.o. female in no acute distress. She appears well-developed and well-nourished and well groomed.   HEENT: Normocephalic, atraumatic, pupils are equal, round and reactive to light and accommodation. Funduscopic exam is normal with sharp disc margins noted.  She is status post cataract repairs.  Face is symmetric, normal facial animation and sensation.  Speech is clear without dysarthria.  No lip, neck or jaw tremor noted.  Hearing is grossly intact.  Neck is supple, no carotid bruits.  Airway examination reveals mildly crowded airway secondary to redundant soft palate.  Tonsils on the smaller side.  Tongue protrudes centrally and palate elevates symmetrically.    Chest: Clear to auscultation without wheezing, rhonchi or crackles noted.  Heart: S1+S2+0, regular and normal without murmurs, rubs or gallops noted.   Abdomen: Soft,  non-tender and non-distended with normal bowel sounds appreciated on auscultation.  Extremities: There is no pitting edema in the distal lower extremities bilaterally. Pedal pulses are intact.  Skin: Warm and dry without trophic changes noted.  Musculoskeletal: exam reveals no obvious joint deformities, tenderness or joint swelling or erythema.   Neurologically:  Mental status: The patient is awake, alert and pays good attention, not fully oriented to year, doctor, city, and county. Her immediate and remote memory, attention, language skills and fund of knowledge are impaired. There is no evidence of aphasia, agnosia, apraxia or anomia. Speech is scant but no dysarthria. Mood is normal and affect is normal.   On 03/27/2020: MMSE: 20/30, CDT: 4/4, AFT: 6/min.   Cranial nerves II - XII are as described above under HEENT exam. In addition: shoulder shrug is normal with equal shoulder height noted. Motor exam: Normal bulk, strength and tone is noted. There is no drift, tremor or rebound. Romberg is negative. Reflexes are 1+ throughout. Babinski: Toes are flexor bilaterally. Fine motor skills and coordination: intact with normal finger taps, normal hand movements, normal rapid alternating patting, normal foot taps and normal foot agility.  Cerebellar testing: No dysmetria or intention tremor on finger to nose testing. Heel to shin is unremarkable bilaterally. There is no truncal or gait ataxia.  Sensory exam: intact to light touch, vibration, temperature sense in the upper and lower extremities.  Gait, station and balance: She stands slowly, she initially feels slightly lightheaded, no assistance needed, she has no walking aid.  She walks slowly and slightly cautiously, slight insecurity with turns.  Preserved arm swing, no shuffling noted.   Assessment and Plan:   In summary, Tiffany Everett is a very pleasant 79 y.o.-year old female with an underlying medical history of hypertension, seasonal  allergies, recent hospitalization for a fall, prior strokes, and overweight state, who presents for evaluation of her cognitive complaints including short-term memory loss since her fall with head injury in February 2021.  She likely sustained a concussion.  She has improved thankfully.  She had some episodes of  confusion and staring which have essentially subsided.  She is trying to hydrate well.  She continues to live in her own home but her sons have taken turns and staying with her to not leave her unsupervised.  She is in safe environment.  She also has a daughter, Tiffany Everett that visits from [place].  She has a family history of memory loss affecting her mom.  Patient herself has not a whole lot of vascular risk factors but was found to have lacunar strokes on CT scan.  She is advised to proceed with further testing from my end of things in the form of brain MRI without contrast, EEG and sleep study.  Underlying sleep disordered breathing may be a contributor to daytime somnolence, also memory dysfunction.  It is unclear what led to her fall.  She has had some PVCs on EKG and is currently wearing a 30-day heart monitor.  She is also scheduled for a thallium stress test and echocardiogram.  We will keep her posted as to her test results by calling her son, Tiffany Everett.  Her memory scores are mildly abnormal, we will continue to monitor for now and I suggested we not start her on any new medication from our and quite yet.  If we find obstructive sleep apnea with her sleep study, she will likely benefit from using a CPAP or AutoPap machine.  She is advised to make sure that she had recent blood work through her primary care physician to check her thyroid function and B12 level.  She also had blood work through the hospital in February.   We talked about the importance of fall prevention.  She is also reminded to stand up slowly and get her bearings first.  She is encouraged to stay well-hydrated and well rested.  I plan  to see her back after testing, I ordered an EEG, nocturnal polysomnogram as well as brain MRI without contrast today.  I answered all her questions today and the patient and her son were in agreement.  Thank you very much for allowing me to participate in the care of this nice patient. If I can be of any further assistance to you please do not hesitate to call me at [phone].  Sincerely,   Huston Foley, MD, PhD

## 2020-03-27 NOTE — Patient Instructions (Addendum)
You have complaints of memory loss and recent lapse in memory: memory loss or changes in cognitive function can have many reasons and does not always mean you have dementia. Conditions that can contribute to subjective or objective memory loss include: depression, stress, poor sleep from insomnia or sleep apnea, dehydration, fluctuation in blood sugar values, thyroid or electrolyte dysfunction and certain vitamin deficiencies. Dementia can be caused by stroke, brain atherosclerosis or brain vascular disease due to vascular risk factors (smoking, high blood pressure, high cholesterol, obesity and uncontrolled diabetes), certain degenerative brain disorders (including Parkinson's disease and Multiple sclerosis) and by Alzheimer's disease or other, more rare and sometimes hereditary causes. You had quite a bit of blood work in the hospital, but please with your primary care office, if you had a recent check on your thyroid function and vitamin B12.   We will do some additional testing from my end:  We will do a brain scan, called MRI and call you with the test results. We will have to schedule you for this on a separate date. This test requires authorization from your insurance, and we will take care of the insurance process. We will do an EEG (brainwave test), which we will schedule. We will call you with the results. We will do a sleep study to rule out obstructive sleep apnea.  We may request a formal cognitive test called neuropsychological evaluation (which is done by a licensed neuropsychologist) down the road.   We will call you with brain scan test results and monitor your symptoms.   Please continue to hydrate well with water and stand up slowly.

## 2020-03-29 ENCOUNTER — Telehealth: Payer: Self-pay | Admitting: Neurology

## 2020-03-29 NOTE — Telephone Encounter (Signed)
[employer] Berkley Harvey: [id number] (exp. 03/29/20 to 09/25/20) patient is scheduled at [facility] for 04/25/20 arrival time si 4:30 pm. I left a voicemail for patient to be aware. I also left their number of [phone] incase she needed to r/s.

## 2020-03-30 ENCOUNTER — Telehealth: Payer: Self-pay

## 2020-03-30 DIAGNOSIS — R55 Syncope and collapse: Secondary | ICD-10-CM | POA: Diagnosis not present

## 2020-03-30 NOTE — Telephone Encounter (Signed)
Left voicemail for second time.

## 2020-04-04 NOTE — Telephone Encounter (Signed)
We have attempted to call the patient two times to schedule sleep study.  Patient has been unavailable at the phone numbers we have on file and has not returned our calls. If patient calls back we will schedule them for their sleep study.  

## 2020-04-10 ENCOUNTER — Ambulatory Visit (HOSPITAL_COMMUNITY)
Admission: RE | Admit: 2020-04-10 | Discharge: 2020-04-10 | Disposition: A | Discharge status: Home / Self Care | Payer: Medicare HMO | Source: Ambulatory Visit | Attending: Cardiovascular Disease | Admitting: Cardiovascular Disease

## 2020-04-10 ENCOUNTER — Other Ambulatory Visit: Payer: Self-pay

## 2020-04-10 ENCOUNTER — Ambulatory Visit (HOSPITAL_BASED_OUTPATIENT_CLINIC_OR_DEPARTMENT_OTHER)
Admission: RE | Admit: 2020-04-10 | Discharge: 2020-04-10 | Disposition: A | Discharge status: Home / Self Care | Payer: Medicare HMO | Source: Ambulatory Visit | Attending: Cardiovascular Disease | Admitting: Cardiovascular Disease

## 2020-04-10 ENCOUNTER — Encounter (HOSPITAL_COMMUNITY)
Admission: RE | Admit: 2020-04-10 | Discharge: 2020-04-10 | Disposition: A | Discharge status: Home / Self Care | Payer: Medicare HMO | Source: Ambulatory Visit | Attending: Cardiovascular Disease | Admitting: Cardiovascular Disease

## 2020-04-10 DIAGNOSIS — R55 Syncope and collapse: Secondary | ICD-10-CM | POA: Diagnosis not present

## 2020-04-10 LAB — NM MYOCAR MULTI W/SPECT W/WALL MOTION / EF
LV dias vol: 63 mL (ref 46–106)
LV sys vol: 28 mL
Peak HR: 100 {beats}/min
RATE: 0.33
Rest HR: 65 {beats}/min
SDS: 0
SRS: 0
SSS: 0
TID: 1.29

## 2020-04-10 MED ORDER — TECHNETIUM TC 99M TETROFOSMIN IV KIT
30.0000 | PACK | Freq: Once | INTRAVENOUS | Status: AC | PRN
  Administered 2020-04-10: 30 via INTRAVENOUS

## 2020-04-10 MED ORDER — REGADENOSON 0.4 MG/5ML IV SOLN
INTRAVENOUS | Status: AC
  Administered 2020-04-10: 0.4 mg via INTRAVENOUS
  Filled 2020-04-10: qty 5

## 2020-04-10 MED ORDER — SODIUM CHLORIDE FLUSH 0.9 % IV SOLN
INTRAVENOUS | Status: AC
  Administered 2020-04-10: 10 mL via INTRAVENOUS
  Filled 2020-04-10: qty 10

## 2020-04-10 MED ORDER — TECHNETIUM TC 99M TETROFOSMIN IV KIT
10.0000 | PACK | Freq: Once | INTRAVENOUS | Status: AC | PRN
  Administered 2020-04-10: 10.7 via INTRAVENOUS

## 2020-04-10 NOTE — Progress Notes (Signed)
*  PRELIMINARY RESULTS* Echocardiogram 2D Echocardiogram has been performed.  Stacey Drain 04/10/2020, 1:24 PM

## 2020-04-20 ENCOUNTER — Telehealth: Payer: Self-pay

## 2020-04-20 NOTE — Telephone Encounter (Signed)
Pt would like Stress Test results.   Please call [phone] Pt states you may leave voice mail at this number   Thanks renee

## 2020-04-20 NOTE — Telephone Encounter (Signed)
Pt notified of stress test results and voiced understanding

## 2020-04-24 ENCOUNTER — Other Ambulatory Visit: Payer: Self-pay

## 2020-04-24 ENCOUNTER — Ambulatory Visit: Payer: Medicare HMO | Admitting: Neurology

## 2020-04-24 DIAGNOSIS — R410 Disorientation, unspecified: Secondary | ICD-10-CM

## 2020-04-24 DIAGNOSIS — Z9181 History of falling: Secondary | ICD-10-CM

## 2020-04-24 DIAGNOSIS — R413 Other amnesia: Secondary | ICD-10-CM

## 2020-04-24 DIAGNOSIS — R6889 Other general symptoms and signs: Secondary | ICD-10-CM

## 2020-04-24 DIAGNOSIS — S069X9A Unspecified intracranial injury with loss of consciousness of unspecified duration, initial encounter: Secondary | ICD-10-CM

## 2020-04-25 ENCOUNTER — Ambulatory Visit (HOSPITAL_COMMUNITY): Admission: RE | Admit: 2020-04-25 | Payer: Medicare HMO | Source: Ambulatory Visit

## 2020-04-27 NOTE — Progress Notes (Signed)
Please call and advise the patient that the EEG or brain wave test we performed was reported as normal in the awake and sleep states. We checked for abnormal electrical discharges in the brain waves and the report suggested normal findings. No further action is required on this test at this time. Please remind patient to keep any upcoming appointments or tests and to call us with any interim questions, concerns, problems or updates. Thanks,  Maybree Riling, MD, PhD   

## 2020-05-01 ENCOUNTER — Telehealth: Payer: Self-pay

## 2020-05-01 NOTE — Telephone Encounter (Signed)
-----   Message from Huston Foley, MD sent at 04/27/2020  3:17 PM EDT ----- Please call and advise the patient that the EEG or brain wave test we performed was reported as normal in the awake and sleep states. We checked for abnormal electrical discharges in the brain waves and the report suggested normal findings. No further action is required on this test at this time. Please remind patient to keep any upcoming appointments or tests and to call us with any interim questions, concerns, problems or updates. Thanks,  Huston Foley, MD, PhD

## 2020-05-01 NOTE — Telephone Encounter (Signed)
I attempted to reach the pt. Pt was not available and vm was full and not accepting new messages. Will try again at a later time.

## 2020-05-02 NOTE — Telephone Encounter (Addendum)
Pt son michael called back in regards to missed call

## 2020-05-04 NOTE — Telephone Encounter (Signed)
Pt advised of results and verbalized understanding.  

## 2020-05-04 NOTE — Telephone Encounter (Signed)
Pt no showed MRI on 04/25/2020 at [facility]. Son is requesting appointment be set up with our mobile unit if possible and if he could be called with the appt date/time. Son understands our office will not reopen until 05/09/2020 due to the holiday.

## 2020-05-11 NOTE — Telephone Encounter (Signed)
The son, Tiffany Everett has called Tiffany Everett back. Please call when available.

## 2020-05-11 NOTE — Telephone Encounter (Signed)
I left a voicemail for her son Kathlene November to give me a call back.

## 2020-05-11 NOTE — Telephone Encounter (Signed)
I spoke to Tiffany Everett I have his mom appt r/s at [facility] for 05/2120 arrival time is 11:30 AM.

## 2020-05-17 ENCOUNTER — Telehealth: Payer: Self-pay | Admitting: Neurology

## 2020-05-29 ENCOUNTER — Other Ambulatory Visit (HOSPITAL_COMMUNITY): Payer: Medicare HMO

## 2020-05-31 DIAGNOSIS — I499 Cardiac arrhythmia, unspecified: Secondary | ICD-10-CM | POA: Diagnosis not present

## 2020-05-31 DIAGNOSIS — R69 Illness, unspecified: Secondary | ICD-10-CM | POA: Diagnosis not present

## 2020-05-31 DIAGNOSIS — R413 Other amnesia: Secondary | ICD-10-CM | POA: Diagnosis not present

## 2020-06-01 DIAGNOSIS — I10 Essential (primary) hypertension: Secondary | ICD-10-CM | POA: Diagnosis not present

## 2020-06-01 DIAGNOSIS — E785 Hyperlipidemia, unspecified: Secondary | ICD-10-CM | POA: Diagnosis not present

## 2020-06-01 DIAGNOSIS — Z0001 Encounter for general adult medical examination with abnormal findings: Secondary | ICD-10-CM | POA: Diagnosis not present

## 2020-06-15 ENCOUNTER — Ambulatory Visit
Admission: EM | Admit: 2020-06-15 | Discharge: 2020-06-15 | Disposition: A | Discharge status: Home / Self Care | Payer: Medicare HMO | Attending: Emergency Medicine | Admitting: Emergency Medicine

## 2020-06-15 ENCOUNTER — Encounter: Payer: Self-pay | Admitting: Emergency Medicine

## 2020-06-15 DIAGNOSIS — L0291 Cutaneous abscess, unspecified: Secondary | ICD-10-CM | POA: Diagnosis not present

## 2020-06-15 MED ORDER — CEFTRIAXONE SODIUM 500 MG IJ SOLR
500.0000 mg | Freq: Once | INTRAMUSCULAR | Status: AC
  Administered 2020-06-15: 500 mg via INTRAMUSCULAR

## 2020-06-15 MED ORDER — DOXYCYCLINE HYCLATE 100 MG PO CAPS: 100.0000 mg | ORAL_CAPSULE | Freq: Two times a day (BID) | ORAL | 0 refills | Status: DC

## 2020-06-15 NOTE — ED Provider Notes (Addendum)
[facility] [place] [facility]   [id number] 06/15/20 Arrival Time: 1339   Chief Complaint  Patient presents with  . Abscess     SUBJECTIVE:  Tiffany Everett is a 79 y.o. female who presents with a possible abscess of her left side of her neck for the past 1 month.  Area is red and she reported serous-sanguinous drainage.  Denies any precipitating event.  Denies similar symptoms in the past.  Denies chills, fever, nausea, vomiting, diarrhea.   ROS: As per HPI.  All other pertinent ROS negative.      Past Medical History:  Diagnosis Date  . Colon polyps   . Hypertension   . Seasonal allergies    Past Surgical History:  Procedure Laterality Date  . APPENDECTOMY    . CATARACT EXTRACTION W/PHACO Right 09/13/2013   Procedure: CATARACT EXTRACTION PHACO AND INTRAOCULAR LENS PLACEMENT (IOC);  Surgeon: Susa Simmonds, MD;  Location: [facility];  Service: Ophthalmology;  Laterality: Right;  CDE:3.25  . CATARACT EXTRACTION W/PHACO Left 11/22/2013   Procedure: CATARACT EXTRACTION PHACO AND INTRAOCULAR LENS PLACEMENT (IOC);  Surgeon: Susa Simmonds, MD;  Location: [facility];  Service: Ophthalmology;  Laterality: Left;  CDE: 3.95  . CESAREAN SECTION     x2  . COLONOSCOPY     SLF 2010: sigmoid diverticula, otherwise wnl. Repeat 5 yrs.  . I & D EXTREMITY Left    lateral wrist  . LAPAROTOMY     oopherectomy  . ORIF HUMERUS FRACTURE Left    No Known Allergies No current facility-administered medications on file prior to encounter.   Current Outpatient Medications on File Prior to Encounter  Medication Sig Dispense Refill  . acetaminophen (TYLENOL) 325 MG tablet Take 2 tablets (650 mg total) by mouth every 6 (six) hours as needed for mild pain (or Fever >/= 101). 12 tablet 2  . aspirin EC 81 MG tablet Take 1 tablet (81 mg total) by mouth daily with breakfast. 30 tablet 3  . lisinopril (ZESTRIL) 10 MG tablet Take 1 tablet (10 mg total) by mouth daily. For BP 30 tablet 3   Social History    Socioeconomic History  . Marital status: Married    Spouse name: Not on file  . Number of children: Not on file  . Years of education: Not on file  . Highest education level: Not on file  Occupational History  . Not on file  Tobacco Use  . Smoking status: Never Smoker  . Smokeless tobacco: Never Used  . Tobacco comment: Never smoked  Vaping Use  . Vaping Use: Never used  Substance and Sexual Activity  . Alcohol use: No    Alcohol/week: 0.0 standard drinks  . Drug use: No  . Sexual activity: Not on file  Other Topics Concern  . Not on file  Social History Narrative  . Not on file   Social Determinants of Health   Financial Resource Strain:   . Difficulty of Paying Living Expenses:   Food Insecurity:   . Worried About Programme researcher, broadcasting/film/video in the Last Year:   . Barista in the Last Year:   Transportation Needs:   . Freight forwarder (Medical):   Marland Kitchen Lack of Transportation (Non-Medical):   Physical Activity:   . Days of Exercise per Week:   . Minutes of Exercise per Session:   Stress:   . Feeling of Stress :   Social Connections:   . Frequency of Communication with Friends and Family:   .  Frequency of Social Gatherings with Friends and Family:   . Attends Religious Services:   . Active Member of Clubs or Organizations:   . Attends Banker Meetings:   Marland Kitchen Marital Status:   Intimate Partner Violence:   . Fear of Current or Ex-Partner:   . Emotionally Abused:   Marland Kitchen Physically Abused:   . Sexually Abused:    Family History  Problem Relation Age of Onset  . Colon cancer Mother 62  . Colon cancer Father 4    OBJECTIVE:  Vitals:   06/15/20 1354 06/15/20 1355  BP:  (!) 159/82  Pulse:  81  Resp:  18  Temp:  97.9 F (36.6 C)  TempSrc:  Oral  SpO2:  97%  Weight: 175 lb (79.4 kg)   Height: 5\' 8"  (1.727 m)      General appearance: alert; no distress Heart: RRR, no murmur, rub or gallop Chest: CTA, heart sounds normal Skin: 3 cm x 1 cm  cm induration of her left posterior neck; tender to touch; currently no active drainage Psychological: alert and cooperative; normal mood and affect  Procedure:   ASSESSMENT & PLAN:  1. Abscess     Meds ordered this encounter  Medications  . doxycycline (VIBRAMYCIN) 100 MG capsule    Sig: Take 1 capsule (100 mg total) by mouth 2 (two) times daily.    Dispense:  20 capsule    Refill:  0  . cefTRIAXone (ROCEPHIN) injection 500 mg    Patient is stable at discharge. Induration site is tender and hard to touch. Conservative measure was discussed and patient was in agreement. Doxycycline will be prescribed. Rocephin IM was given in office  Discharge instructions  Apply warm compresses 3-4x daily for 10-15 minutes Wash site daily with warm water and mild soap Keep covered to avoid friction Take antibiotic as prescribed and to completion Follow up here or with PCP if symptoms persists Return or go to the ED if you have any new or worsening symptoms increased redness, swelling, pain, nausea, vomiting, fever, chills, etc...    Reviewed expectations re: course of current medical issues. Questions answered. Outlined signs and symptoms indicating need for more acute intervention. Patient verbalized understanding. After Visit Summary given.     Note: This document was prepared using Dragon voice recognition software and may include unintentional dictation errors.      , FNP 06/15/20 1425    08/16/20, FNP 06/15/20 1430

## 2020-06-15 NOTE — ED Triage Notes (Signed)
Abscess to back of LT side of next  x1 month that has continued to get bigger. Area is red, painful and has drained on and off.

## 2020-06-15 NOTE — Discharge Instructions (Addendum)
Apply warm compresses 3-4x daily for 10-15 minutes Wash site daily with warm water and mild soap Keep covered to avoid friction Take antibiotic as prescribed and to completion Follow up here or with PCP if symptoms persists Return or go to the ED if you have any new or worsening symptoms increased redness, swelling, pain, nausea, vomiting, fever, chills, etc...  

## 2020-06-20 DIAGNOSIS — R221 Localized swelling, mass and lump, neck: Secondary | ICD-10-CM | POA: Diagnosis not present

## 2020-06-20 DIAGNOSIS — I10 Essential (primary) hypertension: Secondary | ICD-10-CM | POA: Diagnosis not present

## 2020-06-20 DIAGNOSIS — R05 Cough: Secondary | ICD-10-CM | POA: Diagnosis not present

## 2020-06-20 DIAGNOSIS — I499 Cardiac arrhythmia, unspecified: Secondary | ICD-10-CM | POA: Diagnosis not present

## 2020-06-23 ENCOUNTER — Other Ambulatory Visit: Payer: Self-pay

## 2020-06-23 ENCOUNTER — Ambulatory Visit (HOSPITAL_COMMUNITY)
Admission: RE | Admit: 2020-06-23 | Discharge: 2020-06-23 | Disposition: A | Discharge status: Home / Self Care | Payer: Medicare HMO | Source: Ambulatory Visit | Attending: Neurology | Admitting: Neurology

## 2020-06-23 DIAGNOSIS — I6782 Cerebral ischemia: Secondary | ICD-10-CM | POA: Diagnosis not present

## 2020-06-23 DIAGNOSIS — J320 Chronic maxillary sinusitis: Secondary | ICD-10-CM | POA: Diagnosis not present

## 2020-06-23 DIAGNOSIS — R6889 Other general symptoms and signs: Secondary | ICD-10-CM | POA: Diagnosis not present

## 2020-06-23 DIAGNOSIS — J322 Chronic ethmoidal sinusitis: Secondary | ICD-10-CM | POA: Diagnosis not present

## 2020-06-23 DIAGNOSIS — G319 Degenerative disease of nervous system, unspecified: Secondary | ICD-10-CM | POA: Diagnosis not present

## 2020-06-23 DIAGNOSIS — Z9181 History of falling: Secondary | ICD-10-CM | POA: Insufficient documentation

## 2020-06-23 DIAGNOSIS — S069X9A Unspecified intracranial injury with loss of consciousness of unspecified duration, initial encounter: Secondary | ICD-10-CM | POA: Diagnosis not present

## 2020-06-23 DIAGNOSIS — R413 Other amnesia: Secondary | ICD-10-CM | POA: Diagnosis not present

## 2020-06-26 ENCOUNTER — Encounter: Payer: Self-pay | Admitting: Neurology

## 2020-06-26 ENCOUNTER — Ambulatory Visit: Payer: Medicare HMO | Admitting: Neurology

## 2020-06-26 ENCOUNTER — Other Ambulatory Visit: Payer: Self-pay

## 2020-06-26 VITALS — BP 134/84 | HR 63 | Ht 68.0 in | Wt 182.3 lb

## 2020-06-26 DIAGNOSIS — G4719 Other hypersomnia: Secondary | ICD-10-CM

## 2020-06-26 DIAGNOSIS — R0683 Snoring: Secondary | ICD-10-CM | POA: Diagnosis not present

## 2020-06-26 DIAGNOSIS — I499 Cardiac arrhythmia, unspecified: Secondary | ICD-10-CM

## 2020-06-26 DIAGNOSIS — R413 Other amnesia: Secondary | ICD-10-CM | POA: Diagnosis not present

## 2020-06-26 NOTE — Progress Notes (Signed)
Please call patient and advise her that her brain MRI without contrast did not show any acute findings.  She does have some volume loss which we call atrophy, it is considered mild in her case.  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  For general prevention of stroke and cardiovascular disease, we recommend regular checkup with the primary care physician to monitor blood pressure, screening for diabetes, monitor cholesterol.

## 2020-06-26 NOTE — Patient Instructions (Addendum)
As discussed, I think it would be helpful to proceed with a sleep study.  Please stop in the sleep lab to schedule your sleep study.  Your MRI scan from 06/23/2020 has not been officially reported with results yet, we will call you soon as we have the official read.  Your EEG which was the electrical brainwave test through our office was benign, thankfully.  I am glad to hear that you are feeling better.  We will continue to monitor your symptoms and plan to follow-up after your sleep study.  Your heart monitor did show some extra beats called PVCs and possible short runs of A. fib but your cardiologist also felt there was quite a bit of artifact on the test.  If you need to discuss further, I would encourage you to make an appointment with a cardiologist, I do not see any appointment pending.  Please continue to stand up slowly and get your bearings first, try to stay better hydrated with water, 6 to 8 cups are recommended daily, 8 ounce each.

## 2020-06-26 NOTE — Progress Notes (Signed)
Subjective:    Patient ID: Tiffany Everett is a 79 y.o. female.  HPI     Interim history:   Tiffany Everett is a 79 year old right-handed woman with an underlying medical history of hypertension, seasonal allergies, Hx of hospitalization after a fall, prior strokes, and overweight state, who Presents for follow-up consultation of her memory loss and episodic confusion.  The patient is accompanied by her daughter today. I first met her on 03/27/2020 at the request of her primary care provider, at which time the patient reported a several month history of memory issues.  She had had a fall a few months ago and symptoms started after the fall which had resulted in a head injury.  She had had recent blood work.  I suggested we proceed with additional testing in the form of brain MRI, EEG and sleep study.  Her MMSE was 20/30 at the time and she reported feeling better.  She had an EEG on 04/24/20 and I reviewed the results: Summary  Normal electroencephalogram, awake, asleep and with activation procedures. There are no focal lateralizing or epileptiform features.   We called with the results.   She did not schedule her sleep study yet, was attempted to contact by the sleep lab. She had a brain MRI wo contrast on 06/23/20 and the official reports is pending.   Today, 06/26/20: She reports feeling better.  She feels that her memory is a little better, balance a little better, no recent falls.  She does not hydrate very well, but self admission she hydrates poorly with water.  She likes to drink soda, about 2 servings per day, 1 cup of coffee per day.  Her daughter is staying with her currently, is visiting.  Her youngest son stays with her more consistently and the other son that was here for the appointment last time lives close by.  She had a Holter monitor and it was reported to have PVCs and PACs and possibly short runs of A. fib but it was not very conclusive secondary to artifact seen as well.  She is  notably sleepy during the day, daughter is worried that she sleeps a lot during the day.  She also lays in bed a lot.  She has no objection to pursuing a sleep study.   The patient's allergies, current medications, family history, past medical history, past social history, past surgical history and problem list were reviewed and updated as appropriate.   Previously:   03/27/20: (She) reports problems with her short-term memory since her hospital stay in February.  Her son reports that she has had some improvement in her memory function but essentially has no recollection of her hospital stay or the fall that led to it.  She was likely down on the ground for about 6 to 12 hours. She hit her right side of the head and had a black eye. She has lived alone since her husband passed away in Dec 24, 2022 of this year.  Her 2 sons take turns to stay with her, Tiffany Everett is usually there during the day and Tiffany Everett is usually there overnight.  She has a daughter who lives in [place] and has stayed with her a week at a time.  Her son reports that she has had some staring spells but no twitching or convulsion is noted.  These have improved and were tied in with episodes of confusion.  Her appetite is good, she tries to hydrate well with water.  She reports a history of  memory loss in the family affecting her mother.  Both parents lived to be in their 22s.  Her mother was a smoker and also drank alcohol excessively per patient and Tiffany Everett.  The patient is a non-smoker and does not drink alcohol.  She has 1 dog in the household.  She has had some dizziness when standing up quickly.  She has not had any other falls.  She sleeps very soundly.  There is no report of snoring.  She has nocturia about once per average night and denies morning headaches.  Bedtime is around 10 and rise time around 9.  Her son reports that she has been sleeping more, longer hours since her hospitalization.   I reviewed your office note from 02/11/2020.  She was  hospitalized from 02/01/2020 through 02/03/2020.  She had been found down by her son, it was unclear how long she was on the ground and how long she had lost consciousness.  She was confused.  I reviewed the emergency room and hospital records.  She had elevated blood pressure values and polycythemia, deemed mainly secondary to hemoconcentration.  Potassium was low at 3.3.  She had a head CT without contrast and maxillofacial CT without contrast as well as cervical spine CT without contrast on 02/01/2020 and I reviewed the results:   IMPRESSION: 1. No acute intracranial abnormality. Multiple old infarcts. 2. No acute abnormality of the cervical spine. 3. Soft tissue contusion around the right orbit with a small subcutaneous hematoma at the superolateral aspect of the right orbit. 4. No other acute abnormality of the maxillofacial structures.   Initial CK level was mildly elevated at 383 but normalized by the time of discharge to 88.    She saw Dr. Bronson Ing in the interim on 03/01/20 with EKG with frequent PVCs noted. She currently has a heart monitor x 30 days and is scheduled for a Lexiscan and echocardiogram.  Her Past Medical History Is Significant For: Past Medical History:  Diagnosis Date  . Colon polyps   . Hypertension   . Seasonal allergies     Her Past Surgical History Is Significant For: Past Surgical History:  Procedure Laterality Date  . APPENDECTOMY    . CATARACT EXTRACTION W/PHACO Right 09/13/2013   Procedure: CATARACT EXTRACTION PHACO AND INTRAOCULAR LENS PLACEMENT (IOC);  Surgeon: Williams Che, MD;  Location: [facility];  Service: Ophthalmology;  Laterality: Right;  CDE:3.25  . CATARACT EXTRACTION W/PHACO Left 11/22/2013   Procedure: CATARACT EXTRACTION PHACO AND INTRAOCULAR LENS PLACEMENT (IOC);  Surgeon: Williams Che, MD;  Location: [facility];  Service: Ophthalmology;  Laterality: Left;  CDE: 3.95  . CESAREAN SECTION     x2  . COLONOSCOPY     SLF 2010: sigmoid  diverticula, otherwise wnl. Repeat 5 yrs.  . I & D EXTREMITY Left    lateral wrist  . LAPAROTOMY     oopherectomy  . ORIF HUMERUS FRACTURE Left     Her Family History Is Significant For: Family History  Problem Relation Age of Onset  . Colon cancer Mother 47  . Colon cancer Father 63    Her Social History Is Significant For: Social History   Socioeconomic History  . Marital status: Married    Spouse name: Not on file  . Number of children: Not on file  . Years of education: Not on file  . Highest education level: Not on file  Occupational History  . Not on file  Tobacco Use  . Smoking status: Never  Smoker  . Smokeless tobacco: Never Used  . Tobacco comment: Never smoked  Vaping Use  . Vaping Use: Never used  Substance and Sexual Activity  . Alcohol use: No    Alcohol/week: 0.0 standard drinks  . Drug use: No  . Sexual activity: Not on file  Other Topics Concern  . Not on file  Social History Narrative  . Not on file   Social Determinants of Health   Financial Resource Strain:   . Difficulty of Paying Living Expenses:   Food Insecurity:   . Worried About Charity fundraiser in the Last Year:   . Arboriculturist in the Last Year:   Transportation Needs:   . Film/video editor (Medical):   Marland Kitchen Lack of Transportation (Non-Medical):   Physical Activity:   . Days of Exercise per Week:   . Minutes of Exercise per Session:   Stress:   . Feeling of Stress :   Social Connections:   . Frequency of Communication with Friends and Family:   . Frequency of Social Gatherings with Friends and Family:   . Attends Religious Services:   . Active Member of Clubs or Organizations:   . Attends Archivist Meetings:   Marland Kitchen Marital Status:     Her Allergies Are:  No Known Allergies:   Her Current Medications Are:  Outpatient Encounter Medications as of 06/26/2020  Medication Sig  . aspirin EC 81 MG tablet Take 1 tablet (81 mg total) by mouth daily with breakfast.   . escitalopram (LEXAPRO) 5 MG tablet Take 5 mg by mouth daily.  . metoprolol succinate (TOPROL-XL) 25 MG 24 hr tablet Take 25 mg by mouth daily.  . [DISCONTINUED] acetaminophen (TYLENOL) 325 MG tablet Take 2 tablets (650 mg total) by mouth every 6 (six) hours as needed for mild pain (or Fever >/= 101). (Patient not taking: Reported on 06/26/2020)  . [DISCONTINUED] doxycycline (VIBRAMYCIN) 100 MG capsule Take 1 capsule (100 mg total) by mouth 2 (two) times daily.  . [DISCONTINUED] lisinopril (ZESTRIL) 10 MG tablet Take 1 tablet (10 mg total) by mouth daily. For BP   No facility-administered encounter medications on file as of 06/26/2020.  :  Review of Systems:  Out of a complete 14 point review of systems, all are reviewed and negative with the exception of these symptoms as listed below: Review of Systems  Neurological:       Here for 3 month f/u on memory loss. Pt reports she has improved since her last visit.  No falls. Last MMSE was 03/27/2020 20/30.    Objective:  Neurological Exam  Physical Exam Physical Examination:   Vitals:   06/26/20 1253  BP: 134/84  Pulse: 63  SpO2: 97%    General Examination: The patient is a very pleasant 79 y.o. female in no acute distress. She appears well-developed and well-nourished and well groomed.   HEENT: Normocephalic, atraumatic, pupils are equal, round and reactive to light. She is status post cataract repairs.  Face is symmetric, normal facial animation and sensation.  Speech is clear without dysarthria.  No lip, neck or jaw tremor noted.  Hearing is grossly intact.  Neck is supple, no carotid bruits.  Airway examination reveals mildly crowded airway secondary to redundant soft palate. Tonsils on the smaller side.  Tongue protrudes centrally and palate elevates symmetrically.    Chest: Clear to auscultation without wheezing, rhonchi or crackles noted.  Heart: S1+S2+0, regular and normal without murmurs, rubs or gallops  noted.    Abdomen: Soft, non-tender and non-distended with normal bowel sounds appreciated on auscultation.  Extremities: There is no pitting edema in the distal lower extremities bilaterally.   Skin: Warm and dry without trophic changes noted.  Musculoskeletal: exam reveals no obvious joint deformities, tenderness or joint swelling or erythema.   Neurologically:  Mental status: The patient is awake, alert and pays good attention, not fully oriented to year, doctor, city, and county. Her immediate and remote memory, attention, language skills and fund of knowledge are impaired. There is no evidence of aphasia, agnosia, apraxia or anomia. Speech is scant but no dysarthria. Mood is normal and affect is normal.   On 03/27/2020: MMSE: 20/30, CDT: 4/4, AFT: 6/min.   Cranial nerves II - XII are as described above under HEENT exam. In addition: shoulder shrug is normal with equal shoulder height noted. Motor exam: Normal bulk, strength and tone is noted. Fine motor skills and coordination: grossly intact.  Cerebellar testing: No dysmetria or intention tremor. There is no truncal or gait ataxia.  Sensory exam: intact to light touch.  Gait, station and balance: She stands slowly, she initially feels slightly lightheaded, no assistance needed, she has no walking aid.  She walks well today, preserved arm swing, no shuffling noted.   Assessment and Plan:   In summary, THEOLA CUELLAR is a very pleasant 79 year old female with an underlying medical history of hypertension, seasonal allergies, fall with hospitalization, prior strokes, and overweight state, who presents for follow-up consultation of her memory loss which started after her fall and head injury in February 2021.  She feels improved.  She had a recent brain MRI with the official report pending, we will call with the results when available, hopefully soon.  She has no new symptoms.  In fact, she overall feels improved.  Her daughter also  believes that she is doing better.  We did an EEG in May 2021 which showed benign findings.  She has not yet scheduled her sleep study but is willing to proceed with it.  She does have a history of PVCs and possibly short runs of A. fib during her recent Holter monitor as reported by cardiology.  She is agreeable to proceeding with a sleep study and we will try to schedule her on her way out today. We talked about the importance of healthy lifestyle, fall prevention and sleep apnea treatment.  If she has obstructive sleep apnea, she will be encouraged to use a CPAP or AutoPap machine.  I plan to see her back soon.  We will call her with her sleep study results and also MRI results in the interim.  She is encouraged to stay better hydrated with water.  Thankfully, she has a good support system. She had blood work through the hospital in February. I answered all their questions today and the patient and her daughter were in agreement. I spent 30 minutes in total face-to-face time and in reviewing records during pre-charting, more than 50% of which was spent in counseling and coordination of care, reviewing test results, reviewing medications and treatment regimen and/or in discussing or reviewing the diagnosis of memory loss, the prognosis and treatment options. Pertinent laboratory and imaging test results that were available during this visit with the patient were reviewed by me and considered in my medical decision making (see chart for details).

## 2020-06-27 ENCOUNTER — Telehealth: Payer: Self-pay

## 2020-06-27 NOTE — Telephone Encounter (Signed)
Pt's son called back and we discussed MRI report. He verbalized understanding and had no questions/concerns at this time. Pt will keep sleep study as scheduled for 07/03/20.

## 2020-06-27 NOTE — Telephone Encounter (Signed)
I called pt. No answer, left a message asking pt to call me back.   

## 2020-06-27 NOTE — Telephone Encounter (Signed)
-----   Message from Huston Foley, MD sent at 06/26/2020  5:03 PM EDT ----- Please call patient and advise her that her brain MRI without contrast did not show any acute findings.  She does have some volume loss which we call atrophy, it is considered mild in her case.  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  For general prevention of stroke and cardiovascular disease, we recommend regular checkup with the primary care physician to monitor blood pressure, screening for diabetes, monitor cholesterol.

## 2020-06-27 NOTE — Telephone Encounter (Signed)
Pt son(Jaquez,Mike) on DPR has called Megan,RN back.  Please call

## 2020-07-03 ENCOUNTER — Ambulatory Visit (INDEPENDENT_AMBULATORY_CARE_PROVIDER_SITE_OTHER): Payer: Medicare HMO | Admitting: Neurology

## 2020-07-03 DIAGNOSIS — G4761 Periodic limb movement disorder: Secondary | ICD-10-CM

## 2020-07-03 DIAGNOSIS — R6889 Other general symptoms and signs: Secondary | ICD-10-CM

## 2020-07-03 DIAGNOSIS — G4720 Circadian rhythm sleep disorder, unspecified type: Secondary | ICD-10-CM

## 2020-07-03 DIAGNOSIS — R413 Other amnesia: Secondary | ICD-10-CM

## 2020-07-03 DIAGNOSIS — R9431 Abnormal electrocardiogram [ECG] [EKG]: Secondary | ICD-10-CM

## 2020-07-03 DIAGNOSIS — Z9181 History of falling: Secondary | ICD-10-CM

## 2020-07-03 DIAGNOSIS — S069X9A Unspecified intracranial injury with loss of consciousness of unspecified duration, initial encounter: Secondary | ICD-10-CM

## 2020-07-24 ENCOUNTER — Telehealth: Payer: Self-pay

## 2020-07-24 NOTE — Progress Notes (Signed)
Patient was last seen on 06/26/20 for memory loss and confusion. She had a diagnostic PSG on 07/03/20.   Please call and notify the patient that the recent sleep study did not show any significant obstructive sleep apnea.  She did not sleep very well, had some difficulty falling asleep and staying asleep.  Nevertheless, she had all stages of sleep.  She did have leg twitching while asleep but these like twitches or kicks did not disrupt her sleep very much.  She had at times frequent PVCs or extra beats.  She had a previous heart monitor.  I had encouraged her to follow-up with cardiology.  Please inquire if she has seen cardiology or has an appointment scheduled.  For her memory loss, she can follow-up in about 3 months with one of our nurse practitioners.  If she feels stable or improved, she can even follow-up in 4 to 6 months, please arrange follow-up appointment as she does not currently have one pending.   Thanks,  Huston Foley, MD, PhD [facility])

## 2020-07-24 NOTE — Telephone Encounter (Signed)
I reached out to the pt and advised of results. She verbalized understanding and agreed to a 3 month f/u with AL, NP for memory. She reports she did complete f/u with cardiology and no changes were reccommended.

## 2020-07-24 NOTE — Telephone Encounter (Signed)
-----   Message from Huston Foley, MD sent at 07/24/2020 11:04 AM EDT ----- Patient was last seen on 06/26/20 for memory loss and confusion. She had a diagnostic PSG on 07/03/20.   Please call and notify the patient that the recent sleep study did not show any significant obstructive sleep apnea.  She did not sleep very well, had some difficulty falling asleep and staying asleep.  Nevertheless, she had all stages of sleep.  She did have leg twitching while asleep but these like twitches or kicks did not disrupt her sleep very much.  She had at times frequent PVCs or extra beats.  She had a previous heart monitor.  I had encouraged her to follow-up with cardiology.  Please inquire if she has seen cardiology or has an appointment scheduled.  For her memory loss, she can follow-up in about 3 months with one of our nurse practitioners.  If she feels stable or improved, she can even follow-up in 4 to 6 months, please arrange follow-up appointment as she does not currently have one pending.   Thanks,  Huston Foley, MD, PhD [facility])

## 2020-07-24 NOTE — Procedures (Signed)
PATIENT'S NAME:  Tiffany Everett, Tiffany Everett DOB:      [birth year]      MR#:    [id number]     DATE OF RECORDING: 07/03/2020 REFERRING M.D.:  Tiffany Sells, MD Study Performed:   Baseline Polysomnogram HISTORY: 79 year old woman with a history of hypertension, seasonal allergies, Hx of hospitalization after a fall, prior strokes, and overweight state, who has memory loss, episodic confusion and daytime somnolence. The patient's weight 182 pounds with a height of 68 (inches), resulting in a BMI of 27.7 kg/m2. The patient's neck circumference measured 15.5 inches.  CURRENT MEDICATIONS: Aspirin, Lexapro, Toprol-XL.   PROCEDURE:  This is a multichannel digital polysomnogram utilizing the Somnostar 11.2 system.  Electrodes and sensors were applied and monitored per AASM Specifications.   EEG, EOG, Chin and Limb EMG, were sampled at 200 Hz.  ECG, Snore and Nasal Pressure, Thermal Airflow, Respiratory Effort, CPAP Flow and Pressure, Oximetry was sampled at 50 Hz. Digital video and audio were recorded.      BASELINE STUDY  Lights Out was at 21:46 and Lights On at 04:58.  Total recording time (TRT) was 432.5 minutes, with a total sleep time (TST) of 261 minutes.   The patient's sleep latency was 89 minutes, which is delayed. REM latency was 141.5 minutes, which is delayed. The sleep efficiency was 60.3%, with mild sleep fragmentation noted and one longer period of wakefulness.     SLEEP ARCHITECTURE: WASO (Wake after sleep onset) was 108 minutes.  There were 6.5 minutes in Stage N1, 103.5 minutes Stage N2, 105.5 minutes Stage N3 and 45.5 minutes in Stage REM.  The percentage of Stage N1 was 2.5%, Stage N2 was 39.7%, Stage N3 was 40.4%, which is increased, and Stage R (REM sleep) was 17.4%, which is near normal. The arousals were noted as: 11 were spontaneous, 6 were associated with PLMs, 1 were associated with respiratory events.  RESPIRATORY ANALYSIS:  There were a total of 3 respiratory events:  3 obstructive apneas, 0  central apneas and 0 mixed apneas with a total of 3 apneas and an apnea index (AI) of .7 /hour. There were 0 hypopneas with a hypopnea index of 0 /hour. The patient also had 0 respiratory event related arousals (RERAs).      The total APNEA/HYPOPNEA INDEX (AHI) was .7/hour and the total RESPIRATORY DISTURBANCE INDEX was  .7 /hour.  0 events occurred in REM sleep and 0 events in NREM. The REM AHI was 0/hour, versus a non-REM AHI of .8. The patient spent 32.5 minutes of total sleep time in the supine position and 229 minutes in non-supine.. The supine AHI was 1.8 versus a non-supine AHI of 0.5.  OXYGEN SATURATION & C02:  The Wake baseline 02 saturation was 93%, with the lowest being 91%. Time spent below 89% saturation equaled 0 minutes.  PERIODIC LIMB MOVEMENTS: The patient had a total of 160 Periodic Limb Movements.  The Periodic Limb Movement (PLM) index was 36.8 and the PLM Arousal index was 1.4/hour.  Audio and video analysis did not show any abnormal or unusual movements, behaviors, phonations or vocalizations. The patient took 1 bathroom break. No significant snoring was noted. The EKG was in keeping with normal sinus rhythm (NSR) with occasional to frequent PVCs noted.   Post-study, the patient indicated that sleep was the same as usual.   IMPRESSION:  1. Periodic Limb Movement Disorder (PLMD) 2. Dysfunctions associated with sleep stages or arousal from sleep 3. Non-specific abnormal EKG  RECOMMENDATIONS:  1. This  study does not demonstrate any significant obstructive or central sleep disordered breathing. 2. Moderate PLMs (periodic limb movements of sleep) were noted during this study with no significant arousals; clinical correlation is recommended. Medication effect from the antidepressant medication should be considered.  3. This study shows sleep fragmentation and abnormal sleep stage percentages; these are nonspecific findings and per se do not signify an intrinsic sleep disorder or  a cause for the patient's sleep-related symptoms. Causes include (but are not limited to) the first night effect of the sleep study, circadian rhythm disturbances, medication effect or an underlying mood disorder or medical problem.  4. The study showed occasional to frequent PVCs on single lead EKG; clinical correlation is recommended and consultation with cardiology may be feasible.  5. The patient should be cautioned not to drive, work at heights, or operate dangerous or heavy equipment when tired or sleepy. Review and reiteration of good sleep hygiene measures should be pursued with any patient. 6. The patient will be seen in follow-up in the sleep clinic at [facility] for discussion of the test results, symptom and treatment compliance review, further management strategies, etc. The referring provider will be notified of the test results.  I certify that I have reviewed the entire raw data recording prior to the issuance of this report in accordance with the Standards of Accreditation of the American Academy of Sleep Medicine (AASM)    Huston Foley, MD, PhD Diplomat, American Board of Neurology and Sleep Medicine (Neurology and Sleep Medicine)

## 2020-09-01 DIAGNOSIS — R634 Abnormal weight loss: Secondary | ICD-10-CM | POA: Diagnosis not present

## 2020-09-01 DIAGNOSIS — I498 Other specified cardiac arrhythmias: Secondary | ICD-10-CM | POA: Diagnosis not present

## 2020-09-01 DIAGNOSIS — I10 Essential (primary) hypertension: Secondary | ICD-10-CM | POA: Diagnosis not present

## 2020-09-01 DIAGNOSIS — J302 Other seasonal allergic rhinitis: Secondary | ICD-10-CM | POA: Diagnosis not present

## 2020-09-01 DIAGNOSIS — E782 Mixed hyperlipidemia: Secondary | ICD-10-CM | POA: Diagnosis not present

## 2020-09-01 DIAGNOSIS — Z0001 Encounter for general adult medical examination with abnormal findings: Secondary | ICD-10-CM | POA: Diagnosis not present

## 2020-09-01 DIAGNOSIS — R7303 Prediabetes: Secondary | ICD-10-CM | POA: Diagnosis not present

## 2020-09-01 DIAGNOSIS — R413 Other amnesia: Secondary | ICD-10-CM | POA: Diagnosis not present

## 2020-09-01 DIAGNOSIS — W19XXXD Unspecified fall, subsequent encounter: Secondary | ICD-10-CM | POA: Diagnosis not present

## 2020-09-01 DIAGNOSIS — R55 Syncope and collapse: Secondary | ICD-10-CM | POA: Diagnosis not present

## 2020-09-01 DIAGNOSIS — Z6825 Body mass index (BMI) 25.0-25.9, adult: Secondary | ICD-10-CM | POA: Diagnosis not present

## 2020-09-01 DIAGNOSIS — D751 Secondary polycythemia: Secondary | ICD-10-CM | POA: Diagnosis not present

## 2020-09-13 DIAGNOSIS — H1032 Unspecified acute conjunctivitis, left eye: Secondary | ICD-10-CM | POA: Diagnosis not present

## 2020-09-13 DIAGNOSIS — I5032 Chronic diastolic (congestive) heart failure: Secondary | ICD-10-CM | POA: Diagnosis not present

## 2020-09-13 DIAGNOSIS — M810 Age-related osteoporosis without current pathological fracture: Secondary | ICD-10-CM | POA: Diagnosis not present

## 2020-09-25 DIAGNOSIS — R7303 Prediabetes: Secondary | ICD-10-CM | POA: Diagnosis not present

## 2020-09-25 DIAGNOSIS — R413 Other amnesia: Secondary | ICD-10-CM | POA: Diagnosis not present

## 2020-09-25 DIAGNOSIS — I499 Cardiac arrhythmia, unspecified: Secondary | ICD-10-CM | POA: Diagnosis not present

## 2020-09-25 DIAGNOSIS — N1831 Chronic kidney disease, stage 3a: Secondary | ICD-10-CM | POA: Diagnosis not present

## 2020-09-25 DIAGNOSIS — E782 Mixed hyperlipidemia: Secondary | ICD-10-CM | POA: Diagnosis not present

## 2020-09-25 DIAGNOSIS — R69 Illness, unspecified: Secondary | ICD-10-CM | POA: Diagnosis not present

## 2020-10-24 ENCOUNTER — Ambulatory Visit: Payer: Self-pay | Admitting: Family Medicine

## 2020-11-21 ENCOUNTER — Encounter: Payer: Self-pay | Admitting: Family Medicine

## 2020-11-21 ENCOUNTER — Ambulatory Visit: Payer: Medicare HMO | Admitting: Family Medicine

## 2020-11-21 VITALS — BP 167/89 | HR 62 | Ht 68.0 in | Wt 184.0 lb

## 2020-11-21 DIAGNOSIS — R413 Other amnesia: Secondary | ICD-10-CM | POA: Diagnosis not present

## 2020-11-21 NOTE — Patient Instructions (Signed)
Below is our plan:  We will continue to monitor symptoms. Consider neurocognitive testing. You can call me if you would like a referral.   Please make sure you are staying well hydrated. I recommend 50-60 ounces daily. Well balanced diet and regular exercise encouraged.    Please continue follow up with care team as directed.   Follow up with me in 3-6 months   You may receive a survey regarding today's visit. I encourage you to leave honest feed back as I do use this information to improve patient care. Thank you for seeing me today!    Memory Compensation Strategies  1. Use "WARM" strategy.  W= write it down  A= associate it  R= repeat it  M= make a mental note  2.   You can keep a Glass blower/designer.  Use a 3-ring notebook with sections for the following: calendar, important names and phone numbers,  medications, doctors' names/phone numbers, lists/reminders, and a section to journal what you did  each day.   3.    Use a calendar to write appointments down.  4.    Write yourself a schedule for the day.  This can be placed on the calendar or in a separate section of the Memory Notebook.  Keeping a  regular schedule can help memory.  5.    Use medication organizer with sections for each day or morning/evening pills.  You may need help loading it  6.    Keep a basket, or pegboard by the door.  Place items that you need to take out with you in the basket or on the pegboard.  You may also want to  include a message board for reminders.  7.    Use sticky notes.  Place sticky notes with reminders in a place where the task is performed.  For example: " turn off the  stove" placed by the stove, "lock the door" placed on the door at eye level, " take your medications" on  the bathroom mirror or by the place where you normally take your medications.  8.    Use alarms/timers.  Use while cooking to remind yourself to check on food or as a reminder to take your medicine, or as a  reminder  to make a call, or as a reminder to perform another task, etc.

## 2020-11-21 NOTE — Progress Notes (Addendum)
Chief Complaint  Patient presents with  . Follow-up    Rm 2  . Memory Loss    Pt is her with her son Legrand Como     HISTORY OF PRESENT ILLNESS: Today 11/21/20  Tiffany Everett is a 79 y.o. female here today for follow up for her memory loss.  She presents today with her son, Legrand Como, who aids in history.  He reports that she seems to be continually improving.  Tiffany Everett feels that she is doing about the same.  She has 2 sons that alternates staying with her.  She is able to perform ADLs independently.  She is able to cook but does not do that very much anymore.  She has driven 2 or 3 times without any difficulty.  She reports that she sleeps more than she used to.  No difficulty sleeping.  She is followed closely by Dr. Nevada Crane and currently taking Escitalopram. She is monitoring BP regularly. She is not on a statin. She is taking asa 65m.    HISTORY (copied from previous note)  Ms. DWiseneris a 79year old right-handed woman with an underlying medical history of hypertension, seasonal allergies, Hx of hospitalization after a fall, prior strokes, andoverweight state, who Presents for follow-up consultation of her memory loss and episodic confusion.  The patient is accompanied by her daughter today. I first met her on 03/27/2020 at the request of her primary care provider, at which time the patient reported a several month history of memory issues.  She had had a fall a few months ago and symptoms started after the fall which had resulted in a head injury.  She had had recent blood work.  I suggested we proceed with additional testing in the form of brain MRI, EEG and sleep study.  Her MMSE was 20/30 at the time and she reported feeling better.  She had an EEG on 04/24/20 and I reviewed the results: Summary  Normal electroencephalogram, awake, asleep and with activation procedures. There are no focal lateralizing or epileptiform features.   We called with the results.   She did not schedule  her sleep study yet, was attempted to contact by the sleep lab. She had a brain MRI wo contrast on 06/23/20 and the official reports is pending.   Today, 06/26/20: She reports feeling better.  She feels that her memory is a little better, balance a little better, no recent falls.  She does not hydrate very well, but self admission she hydrates poorly with water.  She likes to drink soda, about 2 servings per day, 1 cup of coffee per day.  Her daughter is staying with her currently, is visiting.  Her youngest son stays with her more consistently and the other son that was here for the appointment last time lives close by.  She had a Holter monitor and it was reported to have PVCs and PACs and possibly short runs of A. fib but it was not very conclusive secondary to artifact seen as well.  She is notably sleepy during the day, daughter is worried that she sleeps a lot during the day.  She also lays in bed a lot.  She has no objection to pursuing a sleep study.   The patient's allergies, current medications, family history, past medical history, past social history, past surgical history and problem list were reviewed and updated as appropriate.   Previously:   03/27/20: (She) reportsproblems with her short-term memory since her hospital stay in February. Her son  reports that she hashad some improvement in her memory function but essentially has no recollection of her hospital stay or the fall that led to it. She was likely down on the ground for about 6 to 12 hours. She hit her right side of the head and had a black eye.She has lived alone since her husband passed away in Jan 31, 2023 of this year. Her 2 sons take turns to stay with her, Ronalee Belts is usually there during the day and Charlotte Crumb is usually there overnight. She has a daughter who lives in [place] and has stayed with her a week at a time. Her son reports that she has had some staring spells but no twitching or convulsion is noted. These have  improved and were tied in with episodes of confusion. Her appetite is good, she tries to hydrate well with water. She reports a history of memory loss in the family affecting her mother. Both parents lived to be in their 36s. Her mother was a smoker and also drank alcohol excessively per patient and Ronalee Belts. The patient is a non-smoker and does not drink alcohol. She has 1 dog in the household. She has had some dizziness when standing up quickly. She has not had any other falls. She sleeps very soundly. There is no report of snoring. She has nocturia about once per average night and denies morning headaches. Bedtime is around 10 and rise time around 9. Her son reports that she has been sleeping more, longer hours since her hospitalization.  I reviewed your office note from 02/11/2020. She was hospitalized from 02/01/2020 through 02/03/2020. She had been found down by her son, it was unclear how long she was on the ground and how long she had lost consciousness. She was confused. I reviewed the emergency room and hospital records. She had elevated blood pressure values and polycythemia, deemed mainly secondary to hemoconcentration.  Potassium was low at 3.3.  She had a head CT without contrast and maxillofacial CT without contrast as well as cervical spine CT without contrast on 02/01/2020 and I reviewed the results:  IMPRESSION: 1. No acute intracranial abnormality. Multiple old infarcts. 2. No acute abnormality of the cervical spine. 3. Soft tissue contusion around the right orbit with a small subcutaneous hematoma at the superolateral aspect of the right orbit. 4. No other acute abnormality of the maxillofacial structures.  Initial CK level was mildly elevated at 383 but normalized by the time of discharge to 88.   She saw Dr. Bronson Ing in the interim on 3/24/21with [facility] frequent PVCs noted. She currently has a heart monitor x 30 days and is scheduled for a Lexiscan and  echocardiogram.   REVIEW OF SYSTEMS: Out of a complete 14 system review of symptoms, the patient complains only of the following symptoms, memory loss, sleepiness and all other reviewed systems are negative.   ALLERGIES: No Known Allergies   HOME MEDICATIONS: Outpatient Medications Prior to Visit  Medication Sig Dispense Refill  . aspirin EC 81 MG tablet Take 1 tablet (81 mg total) by mouth daily with breakfast. 30 tablet 3  . escitalopram (LEXAPRO) 5 MG tablet Take 5 mg by mouth daily.    . metoprolol succinate (TOPROL-XL) 25 MG 24 hr tablet Take 25 mg by mouth daily.    . Vitamin D, Ergocalciferol, (DRISDOL) 1.25 MG (50000 UNIT) CAPS capsule Take 50,000 Units by mouth every 7 (seven) days.     No facility-administered medications prior to visit.     PAST MEDICAL HISTORY: Past  Medical History:  Diagnosis Date  . Colon polyps   . Hypertension   . Seasonal allergies      PAST SURGICAL HISTORY: Past Surgical History:  Procedure Laterality Date  . APPENDECTOMY    . CATARACT EXTRACTION W/PHACO Right 09/13/2013   Procedure: CATARACT EXTRACTION PHACO AND INTRAOCULAR LENS PLACEMENT (IOC);  Surgeon: Williams Che, MD;  Location: [facility];  Service: Ophthalmology;  Laterality: Right;  CDE:3.25  . CATARACT EXTRACTION W/PHACO Left 11/22/2013   Procedure: CATARACT EXTRACTION PHACO AND INTRAOCULAR LENS PLACEMENT (IOC);  Surgeon: Williams Che, MD;  Location: [facility];  Service: Ophthalmology;  Laterality: Left;  CDE: 3.95  . CESAREAN SECTION     x2  . COLONOSCOPY     SLF 2010: sigmoid diverticula, otherwise wnl. Repeat 5 yrs.  . I & D EXTREMITY Left    lateral wrist  . LAPAROTOMY     oopherectomy  . ORIF HUMERUS FRACTURE Left      FAMILY HISTORY: Family History  Problem Relation Age of Onset  . Colon cancer Mother 68  . Colon cancer Father 47     SOCIAL HISTORY: Social History   Socioeconomic History  . Marital status: Married    Spouse name: Not on file  . Number  of children: Not on file  . Years of education: Not on file  . Highest education level: Not on file  Occupational History  . Not on file  Tobacco Use  . Smoking status: Never Smoker  . Smokeless tobacco: Never Used  . Tobacco comment: Never smoked  Vaping Use  . Vaping Use: Never used  Substance and Sexual Activity  . Alcohol use: No    Alcohol/week: 0.0 standard drinks  . Drug use: No  . Sexual activity: Not on file  Other Topics Concern  . Not on file  Social History Narrative  . Not on file   Social Determinants of Health   Financial Resource Strain: Not on file  Food Insecurity: Not on file  Transportation Needs: Not on file  Physical Activity: Not on file  Stress: Not on file  Social Connections: Not on file  Intimate Partner Violence: Not on file      PHYSICAL EXAM  Vitals:   11/21/20 0835  BP: (!) 167/89  Pulse: 62  Weight: 184 lb (83.5 kg)  Height: 5' 8" (1.727 m)   Body mass index is 27.98 kg/m.   Generalized: Well developed, in no acute distress  Cardiology: normal rate and rhythm, no murmur auscultated  Respiratory: clear to auscultation bilaterally    Neurological examination  Mentation: Alert oriented to time, place, history taking. Follows all commands speech and language fluent Cranial nerve II-XII: Pupils were equal round reactive to light. Extraocular movements were full, visual field were full on confrontational test. Facial sensation and strength were normal. Head turning and shoulder shrug  were normal and symmetric. Motor: The motor testing reveals 5 over 5 strength of all 4 extremities. Good symmetric motor tone is noted throughout.  Sensory: Sensory testing is intact to soft touch on all 4 extremities. No evidence of extinction is noted.  Coordination: Cerebellar testing reveals good finger-nose-finger and heel-to-shin bilaterally.  Gait and station: Gait is short but stable with no assistive device.     DIAGNOSTIC DATA (LABS,  IMAGING, TESTING) - I reviewed patient records, labs, notes, testing and imaging myself where available.  Lab Results  Component Value Date   WBC 5.3 02/03/2020   HGB 14.2 02/03/2020  HCT 43.1 02/03/2020   MCV 89.6 02/03/2020   PLT 175 02/03/2020      Component Value Date/Time   NA 140 03/01/2020 1019   K 4.3 03/01/2020 1019   CL 104 03/01/2020 1019   CO2 25 03/01/2020 1019   GLUCOSE 99 03/01/2020 1019   BUN 17 03/01/2020 1019   CREATININE 1.06 (H) 03/01/2020 1019   CALCIUM 9.7 03/01/2020 1019   PROT 5.7 (L) 02/03/2020 0411   ALBUMIN 3.3 (L) 02/03/2020 0411   AST 17 02/03/2020 0411   ALT 15 02/03/2020 0411   ALKPHOS 63 02/03/2020 0411   BILITOT 0.7 02/03/2020 0411   GFRNONAA 50 (L) 03/01/2020 1019   GFRAA 58 (L) 03/01/2020 1019   No results found for: CHOL, HDL, LDLCALC, LDLDIRECT, TRIG, CHOLHDL No results found for: HGBA1C No results found for: VITAMINB12 No results found for: TSH  MMSE - Mini Mental State Exam 11/21/2020 03/27/2020  Orientation to time 4 4  Orientation to Place 4 2  Registration 3 3  Attention/ Calculation 5 3  Recall 0 0  Language- name 2 objects 2 2  Language- repeat 1 1  Language- follow 3 step command 3 2  Language- read & follow direction 1 1  Write a sentence 1 1  Copy design 1 1  Total score 25 20     ASSESSMENT AND PLAN  79 y.o. year old female  has a past medical history of Colon polyps, Hypertension, and Seasonal allergies. here with    Memory loss  Brandey feels that symptoms are fairly stable since last being seen.  We have discussed her options in management of memory loss.  She was advised to consider formal neurocognitive testing.  May consider Aricept or Namenda in the future pending follow-up.  She is not interested in any change in management at this time but agrees to return to see [place] in 3 to 6 months.  Memory compensation strategies reviewed.  She verbalizes understanding and agreement with the plan.   No orders of  the defined types were placed in this encounter.     I spent 20 minutes of face-to-face and non-face-to-face time with patient.  This included previsit chart review, lab review, study review, order entry, electronic health record documentation, patient education.    Debbora Presto, MSN, FNP-C 11/21/2020, 10:28 AM  [facility] [address] [place], [place] [address] [phone]  I reviewed the above note and documentation by the Nurse Practitioner and agree with the history, exam, assessment and plan as outlined above. I was available for consultation. Star Age, MD, PhD [facility])

## 2021-02-26 ENCOUNTER — Ambulatory Visit: Payer: Medicare HMO | Admitting: Family Medicine

## 2021-04-23 DIAGNOSIS — E782 Mixed hyperlipidemia: Secondary | ICD-10-CM | POA: Diagnosis not present

## 2021-04-23 DIAGNOSIS — E559 Vitamin D deficiency, unspecified: Secondary | ICD-10-CM | POA: Insufficient documentation

## 2021-04-23 DIAGNOSIS — R5383 Other fatigue: Secondary | ICD-10-CM | POA: Diagnosis not present

## 2021-04-23 DIAGNOSIS — H00015 Hordeolum externum left lower eyelid: Secondary | ICD-10-CM | POA: Diagnosis not present

## 2021-04-23 DIAGNOSIS — I499 Cardiac arrhythmia, unspecified: Secondary | ICD-10-CM | POA: Insufficient documentation

## 2021-04-23 DIAGNOSIS — Z0000 Encounter for general adult medical examination without abnormal findings: Secondary | ICD-10-CM | POA: Diagnosis not present

## 2021-05-28 ENCOUNTER — Ambulatory Visit: Payer: Medicare HMO | Admitting: Family Medicine

## 2021-05-28 ENCOUNTER — Encounter: Payer: Self-pay | Admitting: Family Medicine

## 2021-05-28 ENCOUNTER — Telehealth: Payer: Self-pay

## 2021-05-28 VITALS — BP 151/76 | HR 81 | Ht 68.0 in | Wt 185.8 lb

## 2021-05-28 DIAGNOSIS — R413 Other amnesia: Secondary | ICD-10-CM | POA: Diagnosis not present

## 2021-05-28 NOTE — Patient Instructions (Signed)
Below is our plan:  We will send a referral over to Dr Kieth Brightly for formal neurocognitive evaluation. This will help guide [place] on whether we have some concerns about dementia. Please follow up closely with [facility]. I do hear some extra beats of your heart, today. This is not new and probably not very concerning but could be a sign of atrial fibrillation. Your last heart monitor was fairly normal but could have shown some concerns as well. It may be helpful to follow up with cardiology due to the prolonged concerns of fatigue. Please consider increasing your activity at home. Exercise is so very beneficial for a number of things.   Please make sure you are staying well hydrated. I recommend 50-60 ounces daily. Well balanced diet and regular exercise encouraged. Consistent sleep schedule with 6-8 hours recommended.   Please continue follow up with care team as directed.   Follow up with me in 6 months   You may receive a survey regarding today's visit. I encourage you to leave honest feed back as I do use this information to improve patient care. Thank you for seeing me today!   Management of Memory Problems   There are some general things you can do to help manage your memory problems.  Your memory may not in fact recover, but by using techniques and strategies you will be able to manage your memory difficulties better.   1)  Establish a routine. Try to establish and then stick to a regular routine.  By doing this, you will get used to what to expect and you will reduce the need to rely on your memory.  Also, try to do things at the same time of day, such as taking your medication or checking your calendar first thing in the morning. Think about think that you can do as a part of a regular routine and make a list.  Then enter them into a daily planner to remind you.  This will help you establish a routine.   2)  Organize your environment. Organize your environment so that it is uncluttered.   Decrease visual stimulation.  Place everyday items such as keys or cell phone in the same place every day (ie.  Basket next to front door) Use post it notes with a brief message to yourself (ie. Turn off light, lock the door) Use labels to indicate where things go (ie. Which cupboards are for food, dishes, etc.) Keep a notepad and pen by the telephone to take messages   3)  Memory Aids A diary or journal/notebook/daily planner Making a list (shopping list, chore list, to do list that needs to be done) Using an alarm as a reminder (kitchen timer or cell phone alarm) Using cell phone to store information (Notes, Calendar, Reminders) Calendar/White board placed in a prominent position Post-it notes   In order for memory aids to be useful, you need to have good habits.  It's no good remembering to make a note in your journal if you don't remember to look in it.  Try setting aside a certain time of day to look in journal.   4)  Improving mood and managing fatigue. There may be other factors that contribute to memory difficulties.  Factors, such as anxiety, depression and tiredness can affect memory. Regular gentle exercise can help improve your mood and give you more energy. Simple relaxation techniques may help relieve symptoms of anxiety Try to get back to completing activities or hobbies you enjoyed doing in  the past. Learn to pace yourself through activities to decrease fatigue. Find out about some local support groups where you can share experiences with others. Try and achieve 7-8 hours of sleep at night.

## 2021-05-28 NOTE — Progress Notes (Addendum)
Chief Complaint  Patient presents with   Follow-up    RM 1, with son Legrand Como. Here for memory f/u, pts memory remains the same. Since fall pts has had fatigue.       HISTORY OF PRESENT ILLNESS: 05/28/2021 ALL:  Tiffany Everett returns for follow up for memory loss. She presents with her son, Legrand Como who assists with HPI. Kalya feels that her memory is stable. No worsening since last visit. She does have occasional short term memory concerns but does not feel this is concerning. She continues to be independent at home. Her sons are usually with her. She hasn't driven since last being seen. She continues to work with PCP regarding concerns of chronic fatigue and depression. She feels that she is always tired. She sleeps well at night. She is not very active during the day. Escitalopram was switched to venlafaxine. She does not feel that she is depressed. Labs with PCP have been unremarkable. No falls since 01/2020.   11/21/2020 ALL:  Tiffany Everett is a 80 y.o. female here today for follow up for her memory loss.  She presents today with her son, Legrand Como, who aids in history.  He reports that she seems to be continually improving.  Aalia feels that she is doing about the same.  She has 2 sons that alternates staying with her.  She is able to perform ADLs independently.  She is able to cook but does not do that very much anymore.  She has driven 2 or 3 times without any difficulty.  She reports that she sleeps more than she used to.  No difficulty sleeping.  She is followed closely by Dr. Nevada Crane and currently taking Escitalopram. She is monitoring BP regularly. She is not on a statin. She is taking asa 39m.    HISTORY (copied from previous note)  Ms. DNashis a 80year old right-handed woman with an underlying medical history of hypertension, seasonal allergies, Hx of hospitalization after a fall, prior strokes, and overweight state, who Presents for follow-up consultation of her memory loss and  episodic confusion.  The patient is accompanied by her daughter today. I first met her on 03/27/2020 at the request of her primary care provider, at which time the patient reported a several month history of memory issues.  She had had a fall a few months ago and symptoms started after the fall which had resulted in a head injury.  She had had recent blood work.  I suggested we proceed with additional testing in the form of brain MRI, EEG and sleep study.  Her MMSE was 20/30 at the time and she reported feeling better.  She had an EEG on 04/24/20 and I reviewed the results: Summary  Normal electroencephalogram, awake, asleep and with activation procedures. There are no focal lateralizing or epileptiform features.    We called with the results.    She did not schedule her sleep study yet, was attempted to contact by the sleep lab. She had a brain MRI wo contrast on 06/23/20 and the official reports is pending.    Today, 06/26/20: She reports feeling better.  She feels that her memory is a little better, balance a little better, no recent falls.  She does not hydrate very well, but self admission she hydrates poorly with water.  She likes to drink soda, about 2 servings per day, 1 cup of coffee per day.  Her daughter is staying with her currently, is visiting.  Her youngest son stays with  her more consistently and the other son that was here for the appointment last time lives close by.  She had a Holter monitor and it was reported to have PVCs and PACs and possibly short runs of A. fib but it was not very conclusive secondary to artifact seen as well.  She is notably sleepy during the day, daughter is worried that she sleeps a lot during the day.  She also lays in bed a lot.  She has no objection to pursuing a sleep study.    The patient's allergies, current medications, family history, past medical history, past social history, past surgical history and problem list were reviewed and updated as appropriate.     Previously:    03/27/20: (She) reports problems with her short-term memory since her hospital stay in February.  Her son reports that she has had some improvement in her memory function but essentially has no recollection of her hospital stay or the fall that led to it.  She was likely down on the ground for about 6 to 12 hours. She hit her right side of the head and had a black eye. She has lived alone since her husband passed away in 01-30-23 of this year.  Her 2 sons take turns to stay with her, Ronalee Belts is usually there during the day and Charlotte Crumb is usually there overnight.  She has a daughter who lives in [place] and has stayed with her a week at a time.  Her son reports that she has had some staring spells but no twitching or convulsion is noted.  These have improved and were tied in with episodes of confusion.  Her appetite is good, she tries to hydrate well with water.  She reports a history of memory loss in the family affecting her mother.  Both parents lived to be in their 46s.  Her mother was a smoker and also drank alcohol excessively per patient and Ronalee Belts.  The patient is a non-smoker and does not drink alcohol.  She has 1 dog in the household.  She has had some dizziness when standing up quickly.  She has not had any other falls.  She sleeps very soundly.  There is no report of snoring.  She has nocturia about once per average night and denies morning headaches.  Bedtime is around 10 and rise time around 9.  Her son reports that she has been sleeping more, longer hours since her hospitalization.   I reviewed your office note from 02/11/2020.  She was hospitalized from 02/01/2020 through 02/03/2020.  She had been found down by her son, it was unclear how long she was on the ground and how long she had lost consciousness.  She was confused.  I reviewed the emergency room and hospital records.  She had elevated blood pressure values and polycythemia, deemed mainly secondary to hemoconcentration.   Potassium was low at 3.3.  She had a head CT without contrast and maxillofacial CT without contrast as well as cervical spine CT without contrast on 02/01/2020 and I reviewed the results:   IMPRESSION: 1. No acute intracranial abnormality. Multiple old infarcts. 2. No acute abnormality of the cervical spine. 3. Soft tissue contusion around the right orbit with a small subcutaneous hematoma at the superolateral aspect of the right orbit. 4. No other acute abnormality of the maxillofacial structures.   Initial CK level was mildly elevated at 383 but normalized by the time of discharge to 88.    She saw Dr. Bronson Ing in  the interim on 03/01/20 with EKG with frequent PVCs noted. She currently has a heart monitor x 30 days and is scheduled for a Lexiscan and echocardiogram.   REVIEW OF SYSTEMS: Out of a complete 14 system review of symptoms, the patient complains only of the following symptoms, memory loss, sleepiness and all other reviewed systems are negative.   ALLERGIES: No Known Allergies   HOME MEDICATIONS: Outpatient Medications Prior to Visit  Medication Sig Dispense Refill   aspirin EC 81 MG tablet Take 1 tablet (81 mg total) by mouth daily with breakfast. 30 tablet 3   metoprolol succinate (TOPROL-XL) 25 MG 24 hr tablet Take 25 mg by mouth daily.     simvastatin (ZOCOR) 10 MG tablet Take 1 tablet by mouth daily.     venlafaxine XR (EFFEXOR-XR) 37.5 MG 24 hr capsule Take 37.5 mg by mouth daily.     Vitamin D, Ergocalciferol, (DRISDOL) 1.25 MG (50000 UNIT) CAPS capsule Take 50,000 Units by mouth every 7 (seven) days.     escitalopram (LEXAPRO) 5 MG tablet Take 5 mg by mouth daily.     No facility-administered medications prior to visit.     PAST MEDICAL HISTORY: Past Medical History:  Diagnosis Date   Colon polyps    Hypertension    Seasonal allergies      PAST SURGICAL HISTORY: Past Surgical History:  Procedure Laterality Date   APPENDECTOMY     CATARACT  EXTRACTION W/PHACO Right 09/13/2013   Procedure: CATARACT EXTRACTION PHACO AND INTRAOCULAR LENS PLACEMENT ([place]);  Surgeon: Williams Che, MD;  Location: [facility];  Service: Ophthalmology;  Laterality: Right;  CDE:3.25   CATARACT EXTRACTION W/PHACO Left 11/22/2013   Procedure: CATARACT EXTRACTION PHACO AND INTRAOCULAR LENS PLACEMENT ([facility]);  Surgeon: Williams Che, MD;  Location: [facility];  Service: Ophthalmology;  Laterality: Left;  CDE: 3.95   CESAREAN SECTION     x2   COLONOSCOPY     SLF 2010: sigmoid diverticula, otherwise wnl. Repeat 5 yrs.   I & D EXTREMITY Left    lateral wrist   LAPAROTOMY     oopherectomy   ORIF HUMERUS FRACTURE Left      FAMILY HISTORY: Family History  Problem Relation Age of Onset   Colon cancer Mother 60   Colon cancer Father 64     SOCIAL HISTORY: Social History   Socioeconomic History   Marital status: Married    Spouse name: Not on file   Number of children: Not on file   Years of education: Not on file   Highest education level: Not on file  Occupational History   Not on file  Tobacco Use   Smoking status: Never   Smokeless tobacco: Never   Tobacco comments:    Never smoked  Vaping Use   Vaping Use: Never used  Substance and Sexual Activity   Alcohol use: No    Alcohol/week: 0.0 standard drinks   Drug use: No   Sexual activity: Not on file  Other Topics Concern   Not on file  Social History Narrative   Not on file   Social Determinants of Health   Financial Resource Strain: Not on file  Food Insecurity: Not on file  Transportation Needs: Not on file  Physical Activity: Not on file  Stress: Not on file  Social Connections: Not on file  Intimate Partner Violence: Not on file      PHYSICAL EXAM  Vitals:   05/28/21 1041  BP: (!) 151/76  Pulse: 81  Weight: 185 lb 12.8 oz (84.3 kg)  Height: 5' 8"  (1.727 m)    Body mass index is 28.25 kg/m.   Generalized: Well developed, in no acute distress  Cardiology: normal  rate and rhythm, no murmur auscultated  Respiratory: clear to auscultation bilaterally    Neurological examination  Mentation: Alert oriented to time, place, history taking. Follows all commands speech and language fluent Cranial nerve II-XII: Pupils were equal round reactive to light. Extraocular movements were full, visual field were full on confrontational test. Facial sensation and strength were normal. Head turning and shoulder shrug  were normal and symmetric. Motor: The motor testing reveals 5 over 5 strength of all 4 extremities. Good symmetric motor tone is noted throughout.  Sensory: Sensory testing is intact to soft touch on all 4 extremities. No evidence of extinction is noted.  Coordination: Cerebellar testing reveals good finger-nose-finger and heel-to-shin bilaterally.  Gait and station: Gait is short but stable with no assistive device.     DIAGNOSTIC DATA (LABS, IMAGING, TESTING) - I reviewed patient records, labs, notes, testing and imaging myself where available.  Lab Results  Component Value Date   WBC 5.3 02/03/2020   HGB 14.2 02/03/2020   HCT 43.1 02/03/2020   MCV 89.6 02/03/2020   PLT 175 02/03/2020      Component Value Date/Time   NA 140 03/01/2020 1019   K 4.3 03/01/2020 1019   CL 104 03/01/2020 1019   CO2 25 03/01/2020 1019   GLUCOSE 99 03/01/2020 1019   BUN 17 03/01/2020 1019   CREATININE 1.06 (H) 03/01/2020 1019   CALCIUM 9.7 03/01/2020 1019   PROT 5.7 (L) 02/03/2020 0411   ALBUMIN 3.3 (L) 02/03/2020 0411   AST 17 02/03/2020 0411   ALT 15 02/03/2020 0411   ALKPHOS 63 02/03/2020 0411   BILITOT 0.7 02/03/2020 0411   GFRNONAA 50 (L) 03/01/2020 1019   GFRAA 58 (L) 03/01/2020 1019   No results found for: CHOL, HDL, LDLCALC, LDLDIRECT, TRIG, CHOLHDL No results found for: HGBA1C No results found for: VITAMINB12 No results found for: TSH  MMSE - Mini Mental State Exam 05/28/2021 11/21/2020 03/27/2020  Orientation to time 4 4 4   Orientation to Place  4 4 2   Registration 3 3 3   Attention/ Calculation 5 5 3   Recall 1 0 0  Language- name 2 objects 2 2 2   Language- repeat 1 1 1   Language- follow 3 step command 3 3 2   Language- read & follow direction 1 1 1   Write a sentence 1 1 1   Copy design 1 1 1   Total score 26 25 20      ASSESSMENT AND PLAN  80 y.o. year old female  has a past medical history of Colon polyps, Hypertension, and Seasonal allergies. here with    Memory loss  Tiffany Everett feels that symptoms are fairly stable since last being seen.  We have discussed her options in management of memory loss.  She was advised to consider formal neurocognitive testing at last follow up but was hesitant. She does feel this could help identify any concerns of neurodegenerative memory loss. Referral placed. I am also concerned that depression may be playing a role. She will continue close follow up with PCP. She is not interested in starting new medications. We may consider Aricept or Namenda in the future pending follow-up.  She will return to see me in 6 months.  Memory compensation strategies reviewed.  She verbalizes understanding and agreement with the plan.  No orders of the defined types were placed in this encounter.    Debbora Presto, MSN, FNP-C 05/28/2021, 11:03 AM  [facility] [address] [place], [place] [address] [phone]  I reviewed the above note and documentation by the Nurse Practitioner and agree with the history, exam, assessment and plan as outlined above. I was available for consultation. Star Age, MD, PhD [facility])

## 2021-05-28 NOTE — Telephone Encounter (Signed)
Referral for neuro psychology sent to Dr. Rodenbough. P: [phone]. 

## 2021-06-01 ENCOUNTER — Encounter: Payer: Self-pay | Admitting: Psychology

## 2021-09-04 ENCOUNTER — Encounter: Payer: Medicare HMO | Admitting: Psychology

## 2021-11-07 DIAGNOSIS — Z23 Encounter for immunization: Secondary | ICD-10-CM | POA: Diagnosis not present

## 2021-11-07 DIAGNOSIS — E049 Nontoxic goiter, unspecified: Secondary | ICD-10-CM | POA: Insufficient documentation

## 2021-11-07 DIAGNOSIS — E559 Vitamin D deficiency, unspecified: Secondary | ICD-10-CM | POA: Diagnosis not present

## 2021-11-07 DIAGNOSIS — E782 Mixed hyperlipidemia: Secondary | ICD-10-CM | POA: Diagnosis not present

## 2021-11-07 DIAGNOSIS — G4719 Other hypersomnia: Secondary | ICD-10-CM | POA: Diagnosis not present

## 2021-11-07 DIAGNOSIS — Z0000 Encounter for general adult medical examination without abnormal findings: Secondary | ICD-10-CM | POA: Diagnosis not present

## 2021-11-15 ENCOUNTER — Ambulatory Visit: Payer: Medicare HMO | Admitting: Psychology

## 2021-11-15 ENCOUNTER — Other Ambulatory Visit: Payer: Self-pay | Admitting: Adult Health Nurse Practitioner

## 2021-11-15 ENCOUNTER — Other Ambulatory Visit (HOSPITAL_COMMUNITY): Payer: Self-pay | Admitting: Adult Health Nurse Practitioner

## 2021-11-15 DIAGNOSIS — E049 Nontoxic goiter, unspecified: Secondary | ICD-10-CM

## 2021-11-20 ENCOUNTER — Ambulatory Visit (HOSPITAL_COMMUNITY)
Admission: RE | Admit: 2021-11-20 | Discharge: 2021-11-20 | Disposition: A | Discharge status: Home / Self Care | Payer: Medicare HMO | Source: Ambulatory Visit | Attending: Adult Health Nurse Practitioner | Admitting: Adult Health Nurse Practitioner

## 2021-11-20 ENCOUNTER — Other Ambulatory Visit: Payer: Self-pay

## 2021-11-20 DIAGNOSIS — E049 Nontoxic goiter, unspecified: Secondary | ICD-10-CM | POA: Insufficient documentation

## 2021-11-20 DIAGNOSIS — E041 Nontoxic single thyroid nodule: Secondary | ICD-10-CM | POA: Diagnosis not present

## 2021-11-27 ENCOUNTER — Ambulatory Visit: Payer: Medicare HMO | Admitting: Family Medicine

## 2022-01-31 IMAGING — US US THYROID
1 series · 12 of 25 positions shown · non-contrast
Comparison: None.

CLINICAL DATA: Palpable abnormality. Thyromegaly on physical
examination.

EXAM:
THYROID ULTRASOUND
TECHNIQUE: Ultrasound examination of the thyroid gland and adjacent soft
tissues was performed.

[Series 1: us thyroid · 12 of 93 slices shown]
[im 4/93]
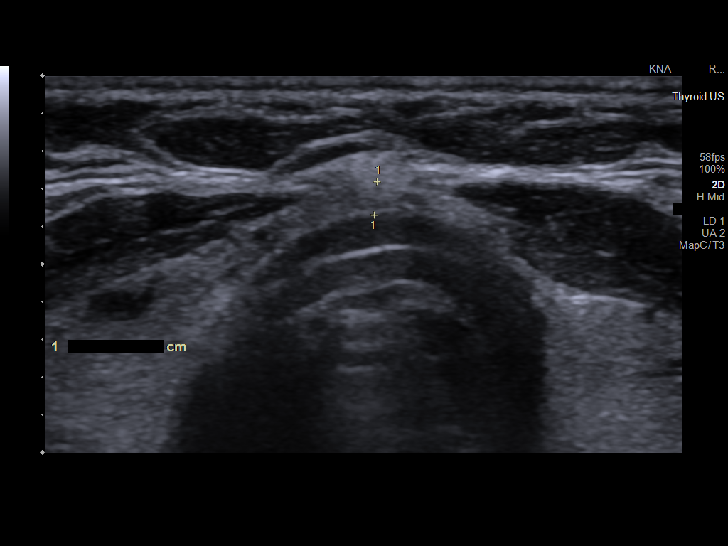
[im 12/93]
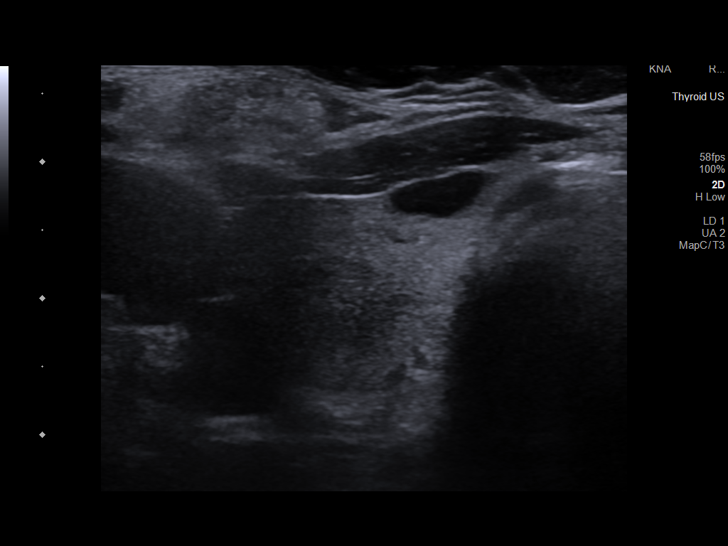
[im 20/93]
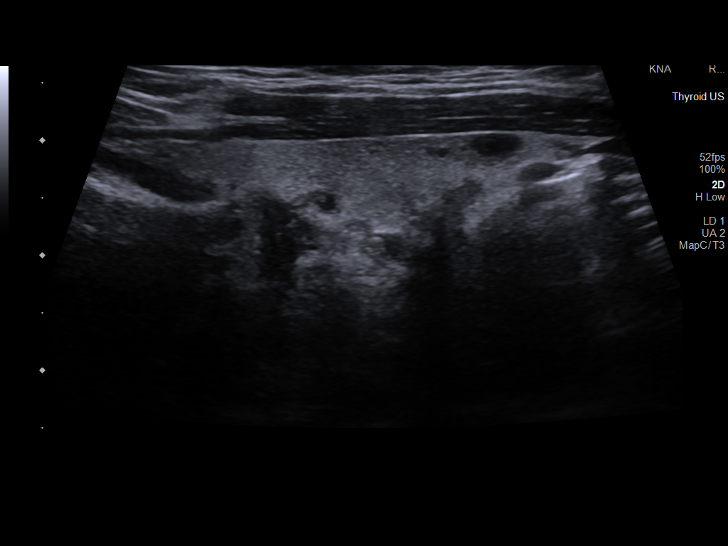
[im 27/93]
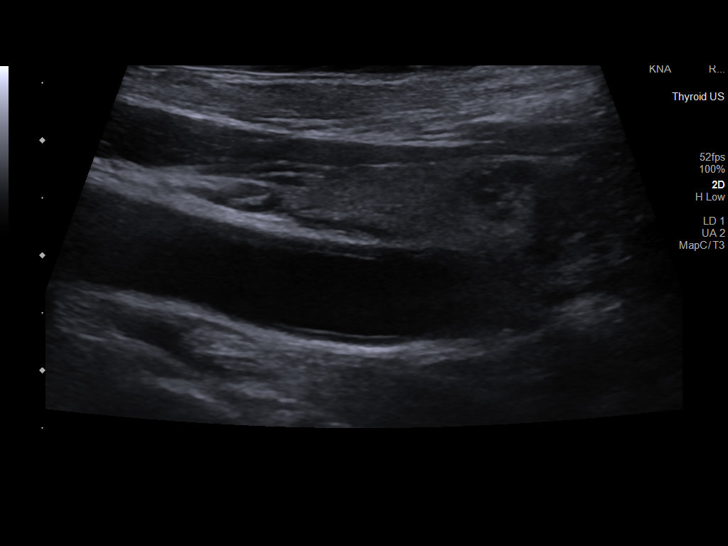
[im 35/93]
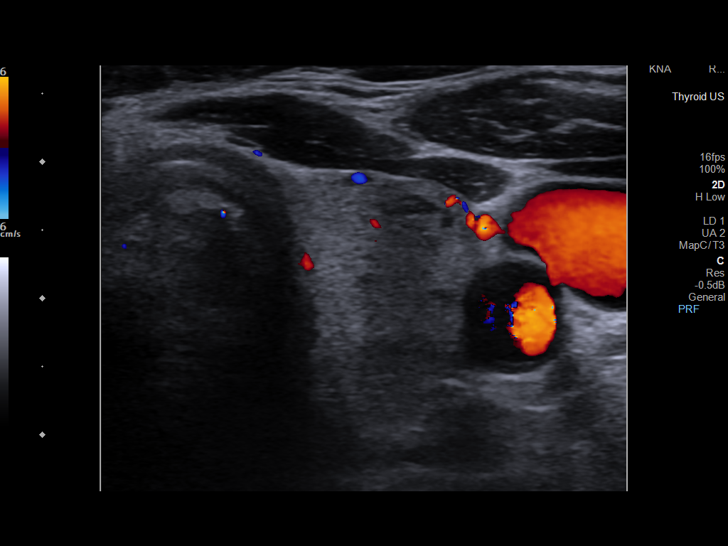
[im 43/93]
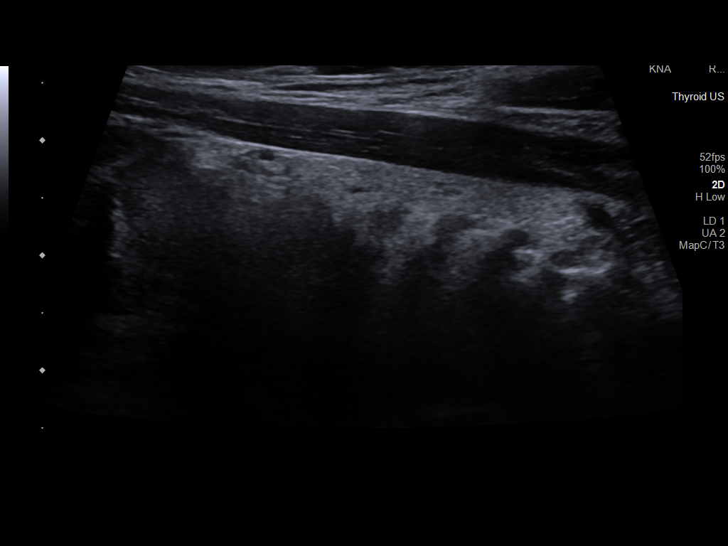
[im 50/93]
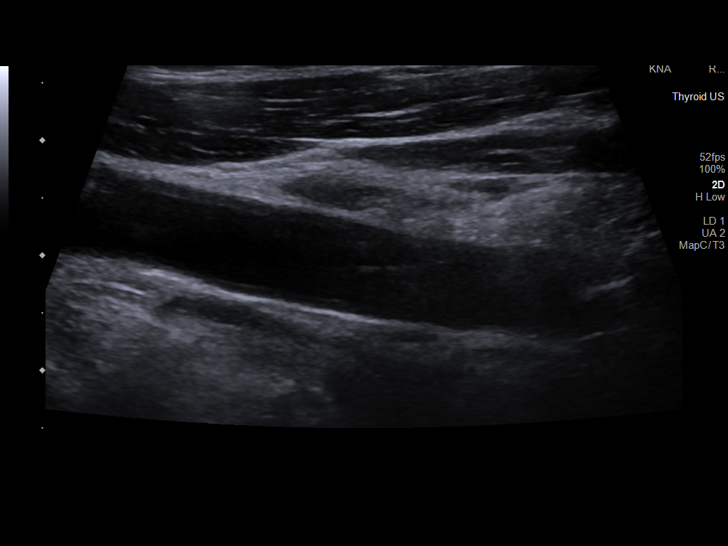
[im 58/93]
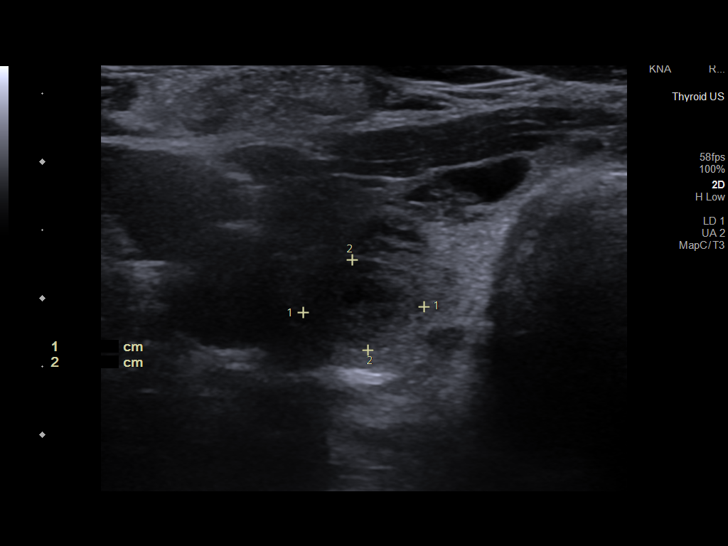
[im 66/93]
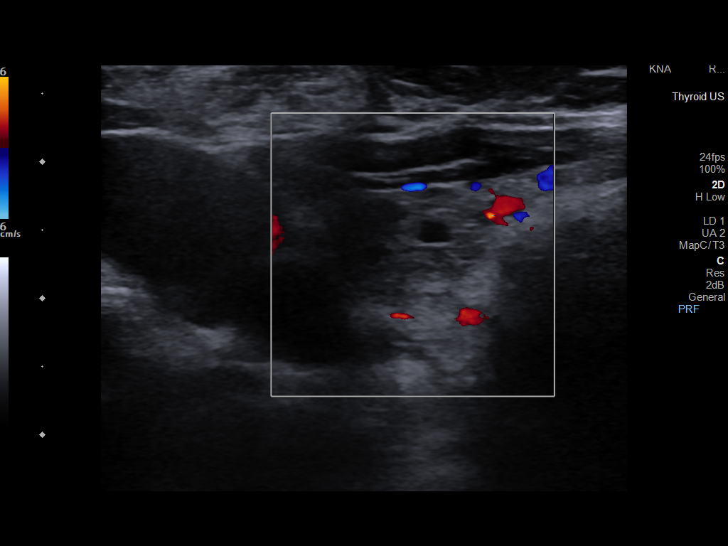
[im 73/93]
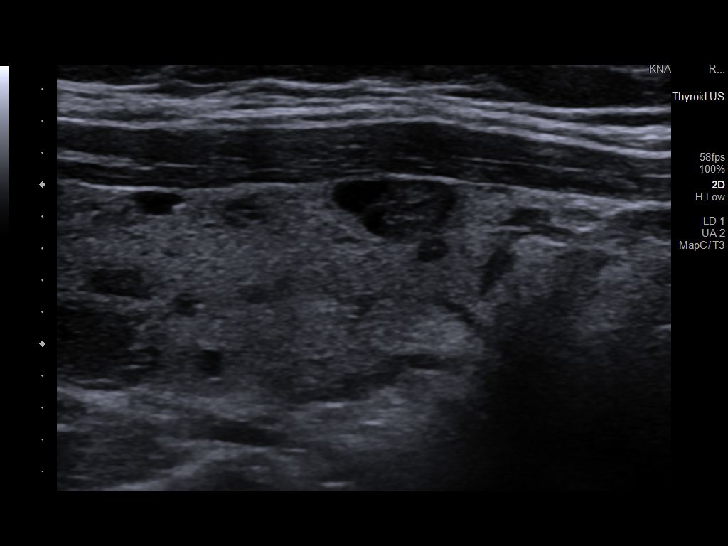
[im 81/93]
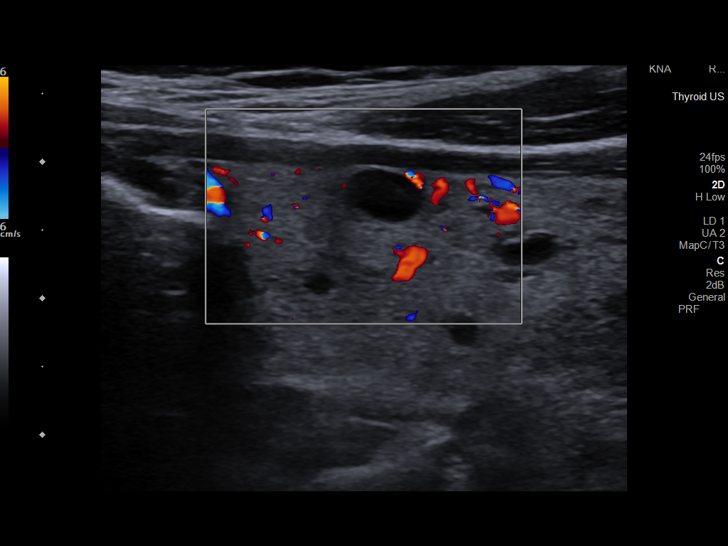
[im 89/93]
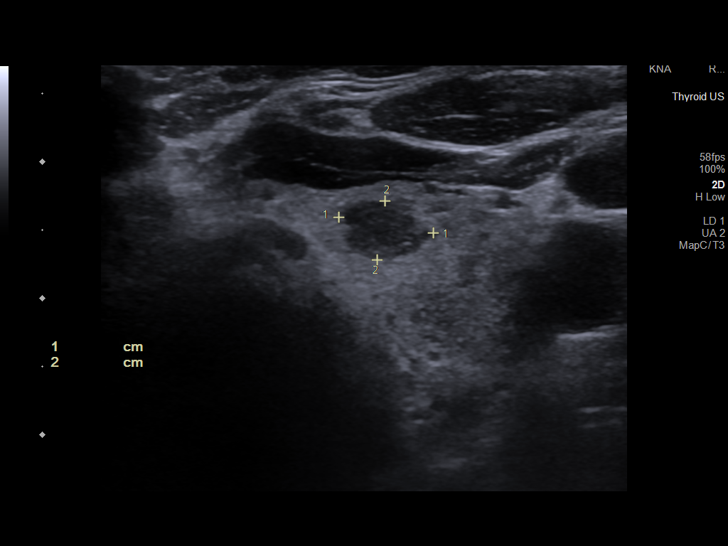

[12 of 25 positions shown; findings below may reference images not displayed]

FINDINGS: Parenchymal Echotexture: Mildly heterogenous

Isthmus: Normal in size measuring 0.2 cm in diameter

Right lobe: Normal in size measuring 4.1 x 2.0 x 1.6 cm

Left lobe: Normal in size measuring 4.2 x 1.8 x 1.5 cm.

_________________________________________________________

Estimated total number of nodules >/= 1 cm: 1

Number of spongiform nodules >/=  2 cm not described below (TR1): 0

Number of mixed cystic and solid nodules >/= 1.5 cm not described
below (TR2): 0

_________________________________________________________

There is an approximately 0.8 cm hypoechoic nodule within the mid,
posterior aspect the right lobe of the thyroid (labeled 1), which
does not meet criteria to recommend percutaneous sampling or
continued dedicated follow-up.

There is an approximately 0.9 x 0.8 x 0.7 cm hypoechoic nodule
within the mid, posterior aspect the right lobe of the thyroid
(labeled 2), which does not meet criteria to recommend percutaneous
sampling or continued dedicated follow-up.

There is an approximately 1.3 x 1.1 x 0.9 cm
spongiform/benign-appearing nodule within the inferior pole of the
right lobe of the thyroid (labeled 3), which does not meet criteria
to recommend percutaneous sampling or continued dedicated follow-up.

There is an approximately 0.8 x 0.8 x 0.4 cm partially cystic,
partially solid isoechoic spongiform/benign-appearing nodule within
the inferior pole the right lobe of the thyroid (labeled 4), which
does not meet criteria to recommend percutaneous sampling or
continued dedicated follow-up.

_________________________________________________________

There is an approximately 0.8 cm anechoic cyst within the superior
pole the left lobe of the thyroid (labeled 5), which does not meet
criteria to recommend percutaneous sampling or continued dedicated
follow-up.

_________________________________________________________

Nodule # 6:

Location: Left; Mid

Maximum size: 0.8 cm; Other 2 dimensions: 0.7 x 0.6 cm

Composition: solid/almost completely solid (2)

Echogenicity: hypoechoic (2)

Shape: not taller-than-wide (0)

Margins: smooth (0)

Echogenic foci: punctate echogenic foci (3)

ACR TI-RADS total points: 7.

ACR TI-RADS risk category: TR5 (>/= 7 points).

ACR TI-RADS recommendations:

*Given size (>/= 0.5 - 0.9 cm) and appearance, a follow-up
ultrasound in 1 year should be considered based on TI-RADS criteria.

_________________________________________________________

There is an approximately 0.7 x 0.6 x 0.4 cm hypoechoic nodule
within the inferior pole the left lobe of the thyroid (labeled 7),
which does not meet criteria to recommend percutaneous sampling or
continued dedicated follow-up.
IMPRESSION: 1. Findings suggestive of multinodular goiter.
2. Nodule labeled #6 meets imaging criteria to recommend a 1 year
follow-up as indicated.
3. None of the additional discretely measured nodules/cysts meet
imaging criteria to recommend percutaneous sampling or continued
dedicated follow-up.

The above is in keeping with the ACR TI-RADS recommendations - [HOSPITAL] 0519;[DATE].

## 2022-03-27 ENCOUNTER — Ambulatory Visit
Admission: EM | Admit: 2022-03-27 | Discharge: 2022-03-27 | Disposition: A | Discharge status: Home / Self Care | Payer: Medicare HMO | Attending: Family Medicine | Admitting: Family Medicine

## 2022-03-27 DIAGNOSIS — S51811A Laceration without foreign body of right forearm, initial encounter: Secondary | ICD-10-CM

## 2022-03-27 NOTE — ED Triage Notes (Incomplete)
Pt reports she tera the skin in the right forearm this morning when she fel on her house floor when trying to pick up her dog.  ?

## 2022-03-27 NOTE — ED Provider Notes (Signed)
?  [facility] ? ? ?[id number] ?03/27/22 Arrival Time: [id number] ? ?ASSESSMENT & PLAN: ? ?1. Skin tear of right forearm without complication, initial encounter   ? ?Normal elbow and wrist movement. No pain. No indication for imaging. ?Non-stick dressing applied to skin tear. Twice daily changes. Monitor for infection. ? ? ?Reviewed expectations re: course of current medical issues. Questions answered. ?Outlined signs and symptoms indicating need for more acute intervention. ?Patient verbalized understanding. ?After Visit Summary given. ? ? ?SUBJECTIVE: ? ?Tiffany Everett is a 81 y.o. female who presents with a skin tear of R forearm; today; trying to lift dog; slipped to floor with resulting skin tear. No pain to elbow or wrist. No extremity sensation changes or weakness. Minimal bleeding. ? ?Reports Td is UTD. ? ?OBJECTIVE: ? ?Vitals:  ? 03/27/22 1219  ?BP: (!) 148/84  ?Pulse: 76  ?Resp: 18  ?Temp: 97.7 ?F (36.5 ?C)  ?TempSrc: Oral  ?SpO2: 94%  ?  ? ?General appearance: alert; no distress ?Skin: linear skin tear of R forearm; size: approx 4x1 cm; clean wound edges, no foreign bodies; without active bleeding ?RUE with normal wrist and elbow movement; no body TTP ?Psychological: alert and cooperative; normal mood and affect ? ? ?Labs Reviewed - No data to display ? ?No results found. ? ?No Known Allergies ? ?Past Medical History:  ?Diagnosis Date  ? Colon polyps   ? Hypertension   ? Seasonal allergies   ? ?Social History  ? ?Socioeconomic History  ? Marital status: Married  ?  Spouse name: Not on file  ? Number of children: Not on file  ? Years of education: Not on file  ? Highest education level: Not on file  ?Occupational History  ? Not on file  ?Tobacco Use  ? Smoking status: Never  ? Smokeless tobacco: Never  ? Tobacco comments:  ?  Never smoked  ?Vaping Use  ? Vaping Use: Never used  ?Substance and Sexual Activity  ? Alcohol use: No  ?  Alcohol/week: 0.0 standard drinks  ? Drug use: No  ? Sexual activity:  Not on file  ?Other Topics Concern  ? Not on file  ?Social History Narrative  ? Not on file  ? ?Social Determinants of Health  ? ?Financial Resource Strain: Not on file  ?Food Insecurity: Not on file  ?Transportation Needs: Not on file  ?Physical Activity: Not on file  ?Stress: Not on file  ?Social Connections: Not on file  ? ? ? ? ? ? ?  ?Vanessa Kick, MD ?03/27/22 1257 ? ?

## 2022-03-30 ENCOUNTER — Encounter (HOSPITAL_BASED_OUTPATIENT_CLINIC_OR_DEPARTMENT_OTHER): Payer: Self-pay

## 2022-03-30 DIAGNOSIS — G4710 Hypersomnia, unspecified: Secondary | ICD-10-CM

## 2022-03-30 DIAGNOSIS — R5383 Other fatigue: Secondary | ICD-10-CM

## 2022-04-03 ENCOUNTER — Other Ambulatory Visit: Payer: Self-pay

## 2022-04-03 ENCOUNTER — Emergency Department (HOSPITAL_COMMUNITY): Payer: Medicare HMO

## 2022-04-03 ENCOUNTER — Emergency Department (HOSPITAL_COMMUNITY)
Admission: EM | Admit: 2022-04-03 | Discharge: 2022-04-03 | Disposition: A | Discharge status: Home / Self Care | Payer: Medicare HMO | Attending: Emergency Medicine | Admitting: Emergency Medicine

## 2022-04-03 ENCOUNTER — Encounter (HOSPITAL_COMMUNITY): Payer: Self-pay | Admitting: Emergency Medicine

## 2022-04-03 DIAGNOSIS — W1830XA Fall on same level, unspecified, initial encounter: Secondary | ICD-10-CM | POA: Diagnosis not present

## 2022-04-03 DIAGNOSIS — R1032 Left lower quadrant pain: Secondary | ICD-10-CM | POA: Diagnosis not present

## 2022-04-03 DIAGNOSIS — K8080 Other cholelithiasis without obstruction: Secondary | ICD-10-CM | POA: Insufficient documentation

## 2022-04-03 DIAGNOSIS — N281 Cyst of kidney, acquired: Secondary | ICD-10-CM | POA: Diagnosis not present

## 2022-04-03 DIAGNOSIS — I700 Atherosclerosis of aorta: Secondary | ICD-10-CM | POA: Diagnosis not present

## 2022-04-03 DIAGNOSIS — Z7982 Long term (current) use of aspirin: Secondary | ICD-10-CM | POA: Insufficient documentation

## 2022-04-03 DIAGNOSIS — T148XXA Other injury of unspecified body region, initial encounter: Secondary | ICD-10-CM | POA: Insufficient documentation

## 2022-04-03 DIAGNOSIS — K8020 Calculus of gallbladder without cholecystitis without obstruction: Secondary | ICD-10-CM | POA: Diagnosis not present

## 2022-04-03 DIAGNOSIS — K5730 Diverticulosis of large intestine without perforation or abscess without bleeding: Secondary | ICD-10-CM | POA: Diagnosis not present

## 2022-04-03 LAB — COMPREHENSIVE METABOLIC PANEL
ALT: 33 U/L (ref 0–44)
AST: 39 U/L (ref 15–41)
Albumin: 4.1 g/dL (ref 3.5–5.0)
Alkaline Phosphatase: 70 U/L (ref 38–126)
Anion gap: 8 (ref 5–15)
BUN: 24 mg/dL — ABNORMAL HIGH (ref 8–23)
CO2: 25 mmol/L (ref 22–32)
Calcium: 8.8 mg/dL — ABNORMAL LOW (ref 8.9–10.3)
Chloride: 107 mmol/L (ref 98–111)
Creatinine, Ser: 1 mg/dL (ref 0.44–1.00)
GFR, Estimated: 57 mL/min — ABNORMAL LOW (ref >60)
Glucose, Bld: 113 mg/dL — ABNORMAL HIGH (ref 70–99)
Potassium: 3.4 mmol/L — ABNORMAL LOW (ref 3.5–5.1)
Sodium: 140 mmol/L (ref 135–145)
Total Bilirubin: 0.4 mg/dL (ref 0.3–1.2)
Total Protein: 7.1 g/dL (ref 6.5–8.1)

## 2022-04-03 LAB — CBC
HCT: 44.9 % (ref 36.0–46.0)
Hemoglobin: 15.1 g/dL — ABNORMAL HIGH (ref 12.0–15.0)
MCH: 30.1 pg (ref 26.0–34.0)
MCHC: 33.6 g/dL (ref 30.0–36.0)
MCV: 89.4 fL (ref 80.0–100.0)
Platelets: 186 10*3/uL (ref 150–400)
RBC: 5.02 MIL/uL (ref 3.87–5.11)
RDW: 13.7 % (ref 11.5–15.5)
WBC: 6.6 10*3/uL (ref 4.0–10.5)
nRBC: 0 % (ref 0.0–0.2)

## 2022-04-03 LAB — URINALYSIS, ROUTINE W REFLEX MICROSCOPIC
Bacteria, UA: NONE SEEN
Bilirubin Urine: NEGATIVE
Glucose, UA: NEGATIVE mg/dL
Hgb urine dipstick: NEGATIVE
Ketones, ur: 5 mg/dL — AB
Leukocytes,Ua: NEGATIVE
Nitrite: POSITIVE — AB
Protein, ur: NEGATIVE mg/dL
Specific Gravity, Urine: 1.025 (ref 1.005–1.030)
pH: 7 (ref 5.0–8.0)

## 2022-04-03 LAB — LIPASE, BLOOD: Lipase: 36 U/L (ref 11–51)

## 2022-04-03 MED ORDER — IOHEXOL 300 MG/ML  SOLN
100.0000 mL | Freq: Once | INTRAMUSCULAR | Status: AC | PRN
  Administered 2022-04-03: 100 mL via INTRAVENOUS

## 2022-04-03 MED ORDER — CEPHALEXIN 500 MG PO CAPS: 500.0000 mg | ORAL_CAPSULE | Freq: Three times a day (TID) | ORAL | 0 refills | Status: AC

## 2022-04-03 NOTE — ED Provider Notes (Signed)
?[facility]ERGENC[facility] DEPARTMENT ?Provider Note ? ? ?CSN: [id number] ?Arrival date & time: 04/03/22  5701 ? ?  ? ?History ? ?Chief Complaint  ?Patient presents with  ? Abdominal Pain  ? ? ?Tiffany Everett is a 81 y.o. female. ? ?Patient presenting with left-sided abdominal pain.  Pain has been constant for about a week.  She states she fell at a ground-level about a week ago and since then has been having pain on that side.  She noticed a small bruise in that area in the last few days as well.  Denies fevers or cough or vomiting or diarrhea.  Denies headache or chest pain ? ? ?  ? ?Home Medications ?Prior to Admission medications   ?Medication Sig Start Date End Date Taking? Authorizing Provider  ?aspirin EC 81 MG tablet Take 1 tablet (81 mg total) by mouth daily with breakfast. 02/03/20  Yes Emokpae, Courage, MD  ?cephALEXin (KEFLEX) 500 MG capsule Take 1 capsule (500 mg total) by mouth 3 (three) times daily for 5 days. 04/03/22 04/08/22 Yes Cheryll Cockayne, MD  ?metoprolol succinate (TOPROL-XL) 25 MG 24 hr tablet Take 25 mg by mouth daily. 06/16/20  Yes [provider]  ?simvastatin (ZOCOR) 10 MG tablet Take 1 tablet by mouth daily. 03/31/21  Yes [provider]  ?venlafaxine XR (EFFEXOR-XR) 37.5 MG 24 hr capsule Take 37.5 mg by mouth daily. 05/22/21  Yes [provider]  ?Vitamin D, Ergocalciferol, (DRISDOL) 1.25 MG (50000 UNIT) CAPS capsule Take 50,000 Units by mouth every 7 (seven) days.   Yes [provider]  ?   ? ?Allergies    ?Patient has no known allergies.   ? ?Review of Systems   ?Review of Systems  ?Constitutional:  Negative for fever.  ?HENT:  Negative for ear pain.   ?Eyes:  Negative for pain.  ?Respiratory:  Negative for cough.   ?Cardiovascular:  Negative for chest pain.  ?Gastrointestinal:  Positive for abdominal pain.  ?Genitourinary:  Negative for flank pain.  ?Musculoskeletal:  Negative for back pain.  ?Skin:  Negative for rash.  ?Neurological:  Negative for  headaches.  ? ?Physical Exam ?Updated Vital Signs ?BP (!) 170/80   Pulse 82   Temp 98.3 ?F (36.8 ?C) (Oral)   Resp 18   Ht 5\' 8"  (1.727 m)   Wt 81.6 kg   LMP  (LMP Unknown)   SpO2 98%   BMI 27.37 kg/m?  ?Physical Exam ?Constitutional:   ?   General: She is not in acute distress. ?   Appearance: Normal appearance.  ?HENT:  ?   Head: Normocephalic.  ?   Nose: Nose normal.  ?Eyes:  ?   Extraocular Movements: Extraocular movements intact.  ?Cardiovascular:  ?   Rate and Rhythm: Normal rate.  ?Pulmonary:  ?   Effort: Pulmonary effort is normal.  ?Abdominal:  ?   Comments: Mild to moderate left-sided abdominal pain.  No guarding or rebound noted.  Small area of ecchymosis seen in the left mid abdominal region approximately 1 x 2 cm in size.  ?Musculoskeletal:     ?   General: Normal range of motion.  ?   Cervical back: Normal range of motion.  ?Neurological:  ?   General: No focal deficit present.  ?   Mental Status: She is alert. Mental status is at baseline.  ? ? ?ED Results / Procedures / Treatments   ?Labs ?(all labs ordered are listed, but only abnormal results are displayed) ?Labs Reviewed  ?  COMPREHENSIVE METABOLIC PANEL - Abnormal; Notable for the following components:  ?    Result Value  ? Potassium 3.4 (*)   ? Glucose, Bld 113 (*)   ? BUN 24 (*)   ? Calcium 8.8 (*)   ? GFR, Estimated 57 (*)   ? All other components within normal limits  ?CBC - Abnormal; Notable for the following components:  ? Hemoglobin 15.1 (*)   ? All other components within normal limits  ?URINALYSIS, ROUTINE W REFLEX MICROSCOPIC - Abnormal; Notable for the following components:  ? Color, Urine STRAW (*)   ? Ketones, ur 5 (*)   ? Nitrite POSITIVE (*)   ? All other components within normal limits  ?LIPASE, BLOOD  ? ? ?EKG ?None ? ?Radiology ?CT Abdomen Pelvis W Contrast ? ?Result Date: 04/03/2022 ?CLINICAL DATA:  81 year old female with history of left lower quadrant abdominal pain for the past week. EXAM: CT ABDOMEN AND PELVIS WITH  CONTRAST TECHNIQUE: Multidetector CT imaging of the abdomen and pelvis was performed using the standard protocol following bolus administration of intravenous contrast. RADIATION DOSE REDUCTION: This exam was performed according to the departmental dose-optimization program which includes automated exposure control, adjustment of the mA and/or kV according to patient size and/or use of iterative reconstruction technique. CONTRAST:  100mL OMNIPAQUE IOHEXOL 300 MG/ML  SOLN COMPARISON:  CT the abdomen and pelvis 03/13/2018. FINDINGS: Lower chest: Atherosclerotic calcifications in the descending thoracic aorta as well as the left anterior descending coronary artery. Small hiatal hernia. Hepatobiliary: Diffuse low attenuation throughout the hepatic parenchyma, indicative of a background of hepatic steatosis. No discrete cystic or solid hepatic lesions. No intra or extrahepatic biliary ductal dilatation. Tiny partially calcified gallstones are noted within the gallbladder. Gallbladder is moderately distended. Gallbladder wall thickness is normal. No pericholecystic fluid or surrounding inflammatory changes. Pancreas: No pancreatic mass. No pancreatic ductal dilatation. No pancreatic or peripancreatic fluid collections or inflammatory changes. Spleen: Unremarkable. Adrenals/Urinary Tract: Moderate left renal atrophy with multifocal cortical thinning in the left kidney. Multiple parapelvic cysts associated with both kidneys. No suspicious renal lesions. No hydroureteronephrosis. Urinary bladder is normal in appearance. Bilateral adrenal glands are normal in appearance. Stomach/Bowel: The appearance of the stomach is normal. There is no pathologic dilatation of small bowel or colon. Numerous colonic diverticulae are noted, particularly in the descending colon and sigmoid colon, without definite focal surrounding inflammatory changes to indicate an acute diverticulitis at this time. Normal appendix. Vascular/Lymphatic: Aortic  atherosclerosis, without evidence of aneurysm or dissection in the abdominal or pelvic vasculature. No lymphadenopathy noted in the abdomen or pelvis. Reproductive: Uterus and ovaries are unremarkable in appearance. Other: No significant volume of ascites.  No pneumoperitoneum. Musculoskeletal: Old healed fracture of the right inferior pubic ramus. There are no aggressive appearing lytic or blastic lesions noted in the visualized portions of the skeleton. IMPRESSION: 1. Colonic diverticulosis without evidence of acute diverticulitis at this time. 2. Cholelithiasis without evidence of acute cholecystitis. 3. Moderate left renal atrophy with multifocal cortical thinning in the left kidney. 4. Aortic atherosclerosis, in addition to at least left anterior descending coronary artery disease. 5. Additional incidental findings, as above. Electronically Signed   By: Trudie Reedaniel  Entrikin M.D.   On: 04/03/2022 08:03   ? ?Procedures ?Procedures  ? ? ?Medications Ordered in ED ?Medications  ?iohexol (OMNIPAQUE) 300 MG/ML solution 100 mL (100 mLs Intravenous Contrast Given 04/03/22 0742)  ? ? ?ED Course/ Medical Decision Making/ A&P ?  ?                        ?  Medical Decision Making ?Amount and/or Complexity of Data Reviewed ?Labs: ordered. ?Radiology: ordered. ? ?Risk ?Prescription drug management. ? ? ?History augmented by caregiver at bedside. ? ?Cardiac monitoring shows sinus rhythm. ? ?Chart review shows sleep study March 30, 2022. ? ?Work-up includes labs, white count normal chemistry normal.  Urinalysis shows positive nitrites. ? ?CT abdomen pelvis shows no acute pathology noted. ? ?Patient advised outpatient follow-up with her doctor this week.  Recommending immediate return for worsening symptoms fevers pain or any additional concerns.  We will treat for possible UTI given positive nitrites. ? ? ? ? ? ? ? ? ? ?Final Clinical Impression(s) / ED Diagnoses ?Final diagnoses:  ?Left lower quadrant abdominal pain  ? ? ?Rx / DC  Orders ?ED Discharge Orders   ? ?      Ordered  ?  cephALEXin (KEFLEX) 500 MG capsule  3 times daily       ? 04/03/22 0942  ? ?  ?  ? ?  ? ? ?  ?Cheryll Cockayne, MD ?04/03/22 [phone] ? ?

## 2022-04-03 NOTE — Discharge Instructions (Signed)
Call your primary care doctor or specialist as discussed in the next 2-3 days.   Return immediately back to the ER if:  Your symptoms worsen within the next 12-24 hours. You develop new symptoms such as new fevers, persistent vomiting, new pain, shortness of breath, or new weakness or numbness, or if you have any other concerns.  

## 2022-04-03 NOTE — ED Triage Notes (Signed)
Pt c/o left abd pain after falling one week ago.  ?

## 2022-04-04 ENCOUNTER — Ambulatory Visit: Payer: Medicare HMO | Admitting: Family Medicine

## 2022-05-08 DIAGNOSIS — N261 Atrophy of kidney (terminal): Secondary | ICD-10-CM | POA: Insufficient documentation

## 2022-05-08 DIAGNOSIS — R5382 Chronic fatigue, unspecified: Secondary | ICD-10-CM | POA: Diagnosis not present

## 2022-05-13 ENCOUNTER — Encounter: Payer: Self-pay | Admitting: Urology

## 2022-05-13 ENCOUNTER — Ambulatory Visit: Payer: Medicare HMO | Admitting: Urology

## 2022-05-13 VITALS — BP 143/92 | HR 62 | Ht 68.0 in | Wt 179.0 lb

## 2022-05-13 DIAGNOSIS — N261 Atrophy of kidney (terminal): Secondary | ICD-10-CM

## 2022-05-13 LAB — URINALYSIS, ROUTINE W REFLEX MICROSCOPIC
Bilirubin, UA: NEGATIVE
Glucose, UA: NEGATIVE
Ketones, UA: NEGATIVE
Nitrite, UA: POSITIVE — AB
Specific Gravity, UA: >1.03 — ABNORMAL HIGH (ref 1.005–1.030)
Urobilinogen, Ur: 0.2 mg/dL (ref 0.2–1.0)
pH, UA: 5.5 (ref 5.0–7.5)

## 2022-05-13 LAB — MICROSCOPIC EXAMINATION: Renal Epithel, UA: NONE SEEN /hpf

## 2022-05-13 LAB — BLADDER SCAN AMB NON-IMAGING: Scan Result: 0

## 2022-05-13 NOTE — Progress Notes (Unsigned)
post void residual =0mL 

## 2022-05-13 NOTE — Progress Notes (Unsigned)
05/13/2022 11:45 AM   [place] December 02, 1941 [id number]  Referring provider: Pablo Lawrence, NP [place],  [place] [address]  No chief complaint on file.   HPI:  New patient-  1) left renal atrophy-patient underwent CT scan of the abdomen and pelvis after a fall in April 2023 which revealed left renal atrophy and some compensatory hypertrophy of the right kidney.  There were no stones.  No mass.  It sounds like she has some bruising on her flank from the fall.  Her recent creatinine was 1.  Looking back she had CT scan in 2011 and 2019 which revealed a left renal atrophy and a healthy right kidney.  2) urinary urgency-she has occasional urinary urgency.  No frequency.  She has a good stream.  She wears pads for incontinence. Not bothered. No urologic meds or surgery.  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 PVR 0. She had a "UTI" recently but no dysuria or bladder pain.   Today, seen for the above.   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   PMH: Past Medical History:  Diagnosis Date   Colon polyps    Hypertension    Seasonal allergies     Surgical History: Past Surgical History:  Procedure Laterality Date   APPENDECTOMY     CATARACT EXTRACTION W/PHACO Right 09/13/2013   Procedure: CATARACT EXTRACTION PHACO AND INTRAOCULAR LENS PLACEMENT ([place]);  Surgeon: Williams Che, MD;  Location: [facility];  Service: Ophthalmology;  Laterality: Right;  CDE:3.25   CATARACT EXTRACTION W/PHACO Left 11/22/2013   Procedure: CATARACT EXTRACTION PHACO AND INTRAOCULAR LENS PLACEMENT ([facility]);  Surgeon: Williams Che, MD;  Location: [facility];  Service: Ophthalmology;  Laterality: Left;  CDE: 3.95   CESAREAN SECTION     x2   COLONOSCOPY     SLF 2010: sigmoid diverticula, otherwise wnl. Repeat 5 yrs.   I & D EXTREMITY Left    lateral wrist   LAPAROTOMY     oopherectomy   ORIF HUMERUS FRACTURE Left     Home Medications:  Allergies as of  05/13/2022   No Known Allergies      Medication List        Accurate as of May 13, 2022 11:45 AM. If you have any questions, ask your nurse or doctor.          aspirin EC 81 MG tablet Take 1 tablet (81 mg total) by mouth daily with breakfast.   metoprolol succinate 25 MG 24 hr tablet Commonly known as: TOPROL-XL Take 25 mg by mouth daily.   simvastatin 10 MG tablet Commonly known as: ZOCOR Take 1 tablet by mouth daily.   venlafaxine XR 37.5 MG 24 hr capsule Commonly known as: EFFEXOR-XR Take 37.5 mg by mouth daily.   Vitamin D (Ergocalciferol) 1.25 MG (50000 UNIT) Caps capsule Commonly known as: DRISDOL Take 50,000 Units by mouth every 7 (seven) days.        Allergies: No Known Allergies  Family History: Family History  Problem Relation Age of Onset   Colon cancer Mother 64   Colon cancer Father 64    Social History:  reports that she has never smoked. She has never used smokeless tobacco. She reports that she does not drink alcohol and does not use drugs.   Physical Exam: BP (!) 143/92   Pulse 62   Ht 5\' 8"  (1.727 m)   Wt 179 lb (81.2 kg)  LMP  (LMP Unknown)   BMI 27.22 kg/m   Constitutional:  Alert and oriented, No acute distress. HEENT: [place] AT, moist mucus membranes.  Trachea midline, no masses. Cardiovascular: No clubbing, cyanosis, or edema. Respiratory: Normal respiratory effort, no increased work of breathing. GI: Abdomen is soft, nontender, nondistended, no abdominal masses GU: No CVA tenderness Skin: No rashes, bruises or suspicious lesions. Neurologic: Grossly intact, no focal deficits, moving all 4 extremities. Psychiatric: Normal mood and affect.  Laboratory Data: Lab Results  Component Value Date   WBC 6.6 04/03/2022   HGB 15.1 (H) 04/03/2022   HCT 44.9 04/03/2022   MCV 89.4 04/03/2022   PLT 186 04/03/2022    Lab Results  Component Value Date   CREATININE 1.00 04/03/2022    No results found for: PSA  No results found for:  TESTOSTERONE  No results found for: HGBA1C  Urinalysis    Component Value Date/Time   COLORURINE STRAW (A) 04/03/2022 0643   APPEARANCEUR CLEAR 04/03/2022 0643   LABSPEC 1.025 04/03/2022 0643   PHURINE 7.0 04/03/2022 [place] 04/03/2022 0643   HGBUR NEGATIVE 04/03/2022 0643   BILIRUBINUR NEGATIVE 04/03/2022 0643   KETONESUR 5 (A) 04/03/2022 0643   PROTEINUR NEGATIVE 04/03/2022 0643   UROBILINOGEN 0.2 06/06/2009 1611   NITRITE POSITIVE (A) 04/03/2022 0643   LEUKOCYTESUR NEGATIVE 04/03/2022 0643    Lab Results  Component Value Date   BACTERIA NONE SEEN 04/03/2022    Pertinent Imaging: CT from 2023, 2019 and 2011 - images reviewed    Assessment & Plan:    1. 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  - Urinalysis, Routine w reflex microscopic - BLADDER SCAN AMB NON-IMAGING  2. urgency and UUI - disc nature r/b of BT, PT and OAB meds. Make sure to drink plenty of water. Also disc benign nature of asymptomatic bacteruria.   She is here with her son.    No follow-ups on file.  Festus Aloe, MD  [facility]  [address] [place], [place] [address] [phone]

## 2022-06-14 IMAGING — CT CT ABD-PELV W/ CM
2 of 5 series · 15 of 46 positions shown, 17 images · IV contrast (agent unspecified)
Comparison: CT the abdomen and pelvis 03/13/2018.

CLINICAL DATA: 80-year-old female with history of left lower
quadrant abdominal pain for the past week.

EXAM:
CT ABDOMEN AND PELVIS WITH CONTRAST
TECHNIQUE: Multidetector CT imaging of the abdomen and pelvis was performed
using the standard protocol following bolus administration of
intravenous contrast.

[Series 2: axial st · axial · 0.96mm/px · z∈[+1011,+1461]mm · 12 of 102 slices shown, 14 images]
[im 6/102  soft-tissue]
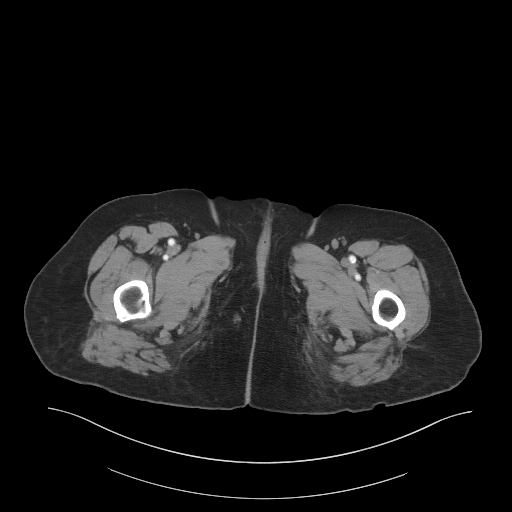
[im 6/102  bone]
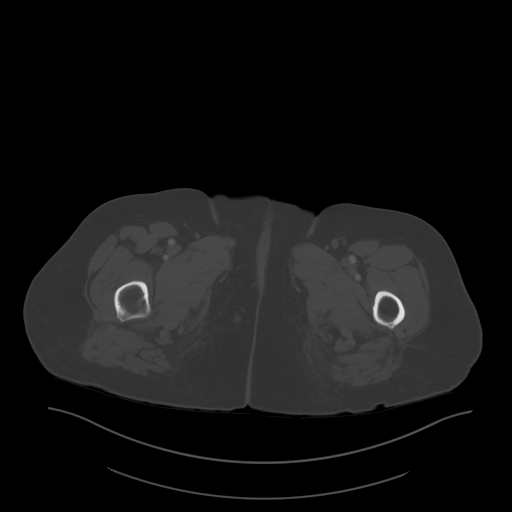
[im 18/102  soft-tissue]
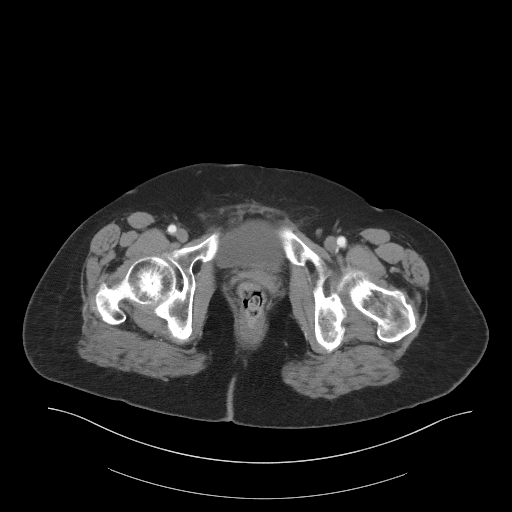
[im 24/102  soft-tissue]
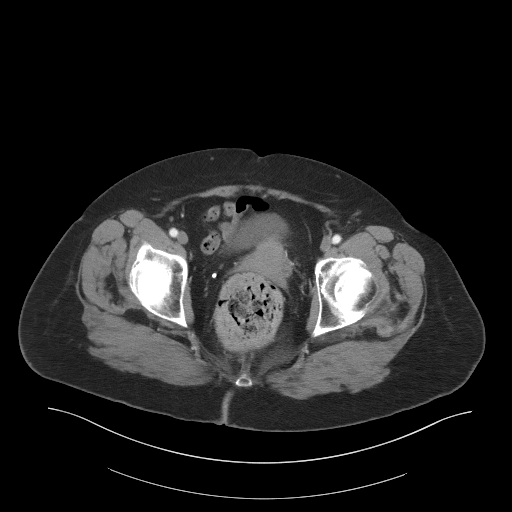
[im 30/102  soft-tissue]
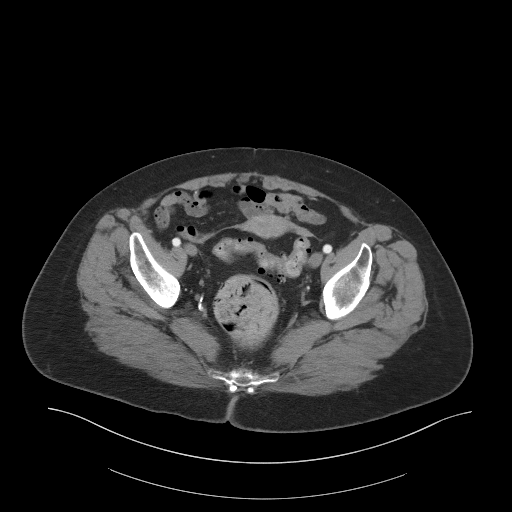
[im 42/102  soft-tissue]
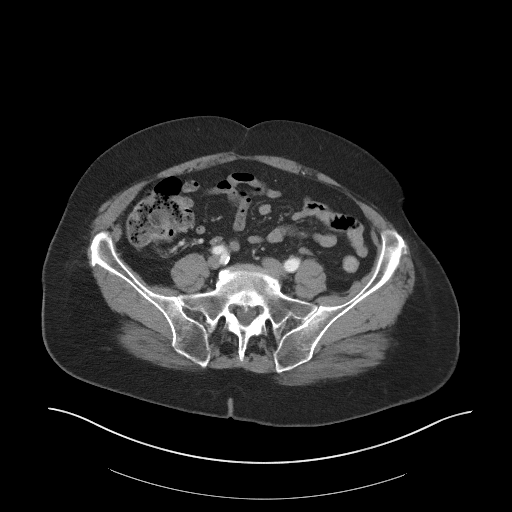
[im 48/102  soft-tissue]
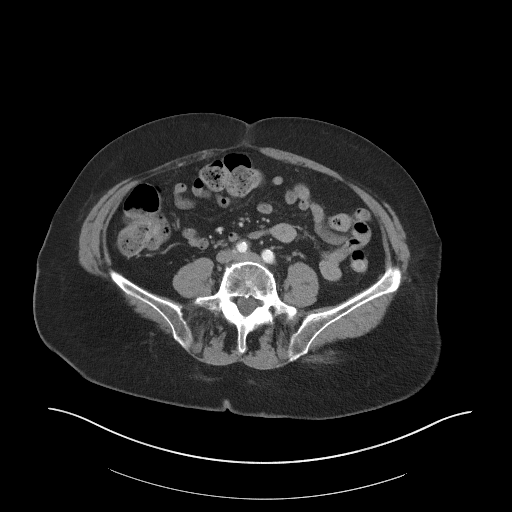
[im 54/102  soft-tissue]
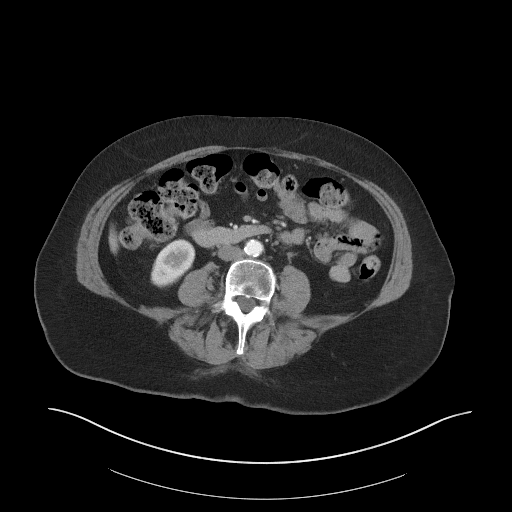
[im 66/102  soft-tissue]
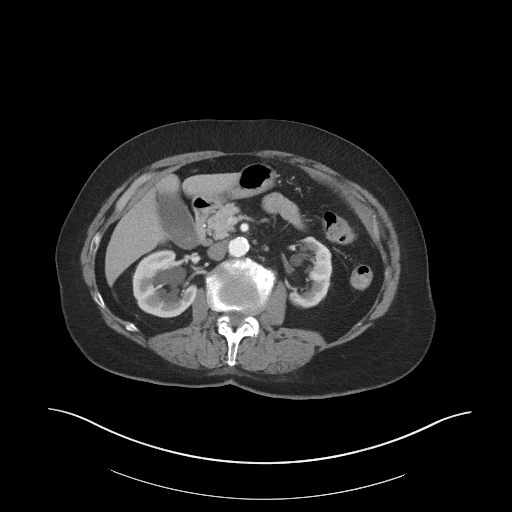
[im 72/102  soft-tissue]
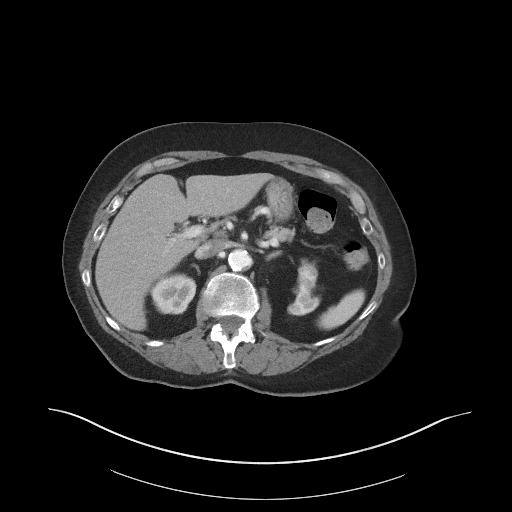
[im 72/102  bone]
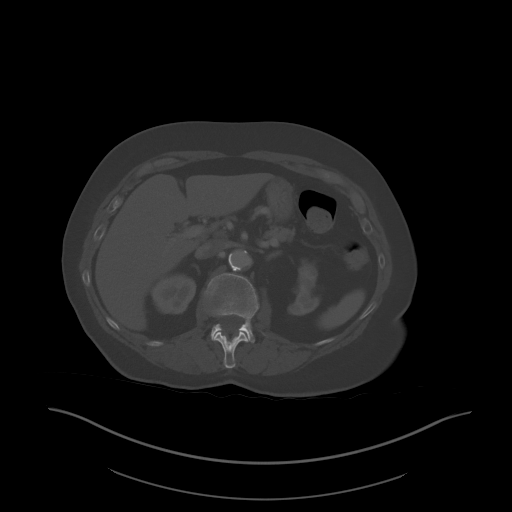
[im 78/102  soft-tissue]
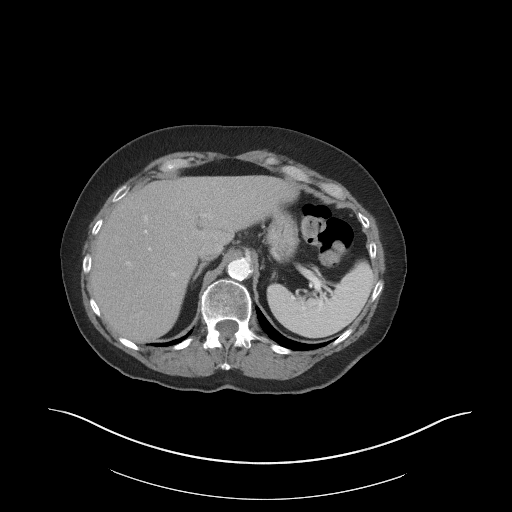
[im 90/102  soft-tissue]
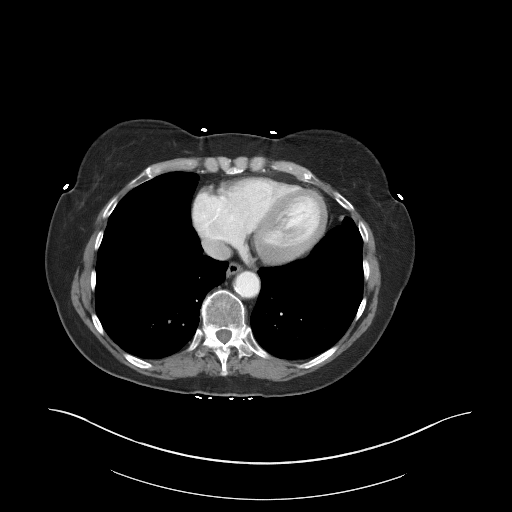
[im 96/102  soft-tissue]
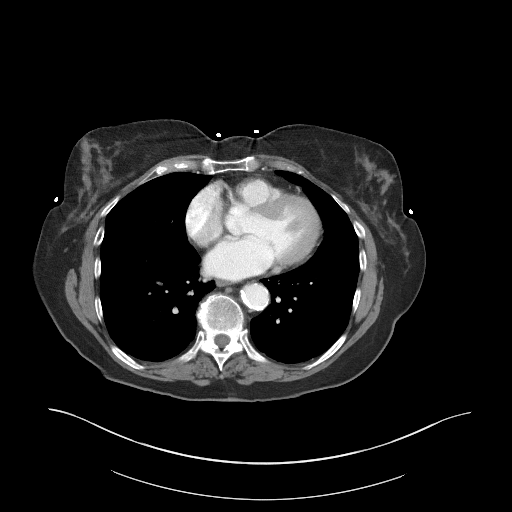

[Series 5: coronal st · coronal · 0.79mm/px · 3 of 117 slices shown]
[im 39/117  soft-tissue]
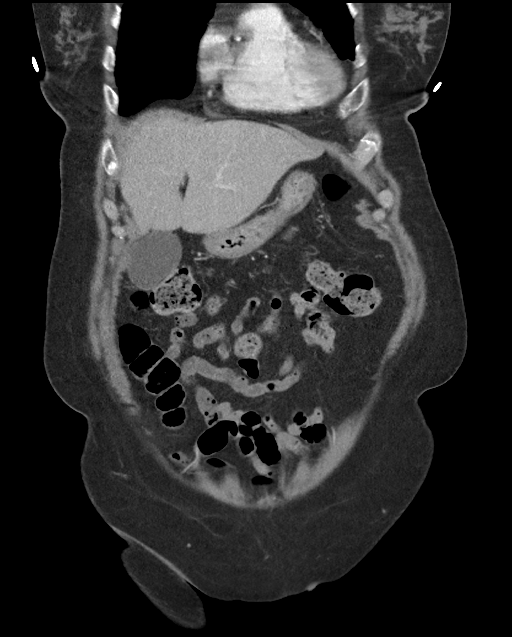
[im 52/117  soft-tissue]
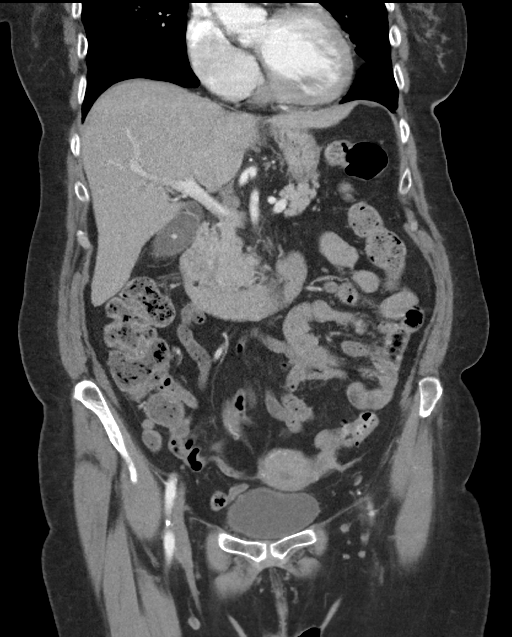
[im 65/117  soft-tissue]
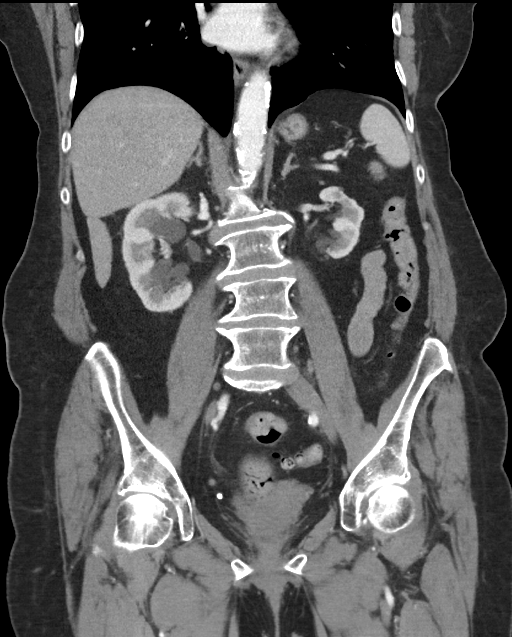

[15 of 46 positions shown; findings below may reference images not displayed]

RADIATION DOSE REDUCTION: This exam was performed according to the
departmental dose-optimization program which includes automated
exposure control, adjustment of the mA and/or kV according to
patient size and/or use of iterative reconstruction technique.

CONTRAST:  100mL OMNIPAQUE IOHEXOL 300 MG/ML  SOLN
FINDINGS: Lower chest: Atherosclerotic calcifications in the descending
thoracic aorta as well as the left anterior descending coronary
artery. Small hiatal hernia.

Hepatobiliary: Diffuse low attenuation throughout the hepatic
parenchyma, indicative of a background of hepatic steatosis. No
discrete cystic or solid hepatic lesions. No intra or extrahepatic
biliary ductal dilatation. Tiny partially calcified gallstones are
noted within the gallbladder. Gallbladder is moderately distended.
Gallbladder wall thickness is normal. No pericholecystic fluid or
surrounding inflammatory changes.

Pancreas: No pancreatic mass. No pancreatic ductal dilatation. No
pancreatic or peripancreatic fluid collections or inflammatory
changes.

Spleen: Unremarkable.

Adrenals/Urinary Tract: Moderate left renal atrophy with multifocal
cortical thinning in the left kidney. Multiple parapelvic cysts
associated with both kidneys. No suspicious renal lesions. No
hydroureteronephrosis. Urinary bladder is normal in appearance.
Bilateral adrenal glands are normal in appearance.

Stomach/Bowel: The appearance of the stomach is normal. There is no
pathologic dilatation of small bowel or colon. Numerous colonic
diverticulae are noted, particularly in the descending colon and
sigmoid colon, without definite focal surrounding inflammatory
changes to indicate an acute diverticulitis at this time. Normal
appendix.

Vascular/Lymphatic: Aortic atherosclerosis, without evidence of
aneurysm or dissection in the abdominal or pelvic vasculature. No
lymphadenopathy noted in the abdomen or pelvis.

Reproductive: Uterus and ovaries are unremarkable in appearance.

Other: No significant volume of ascites.  No pneumoperitoneum.

Musculoskeletal: Old healed fracture of the right inferior pubic
ramus. There are no aggressive appearing lytic or blastic lesions
noted in the visualized portions of the skeleton.
IMPRESSION: 1. Colonic diverticulosis without evidence of acute diverticulitis
at this time.
2. Cholelithiasis without evidence of acute cholecystitis.
3. Moderate left renal atrophy with multifocal cortical thinning in
the left kidney.
4. Aortic atherosclerosis, in addition to at least left anterior
descending coronary artery disease.
5. Additional incidental findings, as above.

## 2022-07-15 ENCOUNTER — Ambulatory Visit: Payer: Medicare HMO | Attending: Neurology | Admitting: Neurology

## 2022-07-15 DIAGNOSIS — G4710 Hypersomnia, unspecified: Secondary | ICD-10-CM | POA: Diagnosis not present

## 2022-07-15 DIAGNOSIS — R0689 Other abnormalities of breathing: Secondary | ICD-10-CM | POA: Diagnosis not present

## 2022-07-15 DIAGNOSIS — I10 Essential (primary) hypertension: Secondary | ICD-10-CM | POA: Diagnosis not present

## 2022-07-15 DIAGNOSIS — R5383 Other fatigue: Secondary | ICD-10-CM | POA: Insufficient documentation

## 2022-07-15 DIAGNOSIS — G4761 Periodic limb movement disorder: Secondary | ICD-10-CM | POA: Diagnosis not present

## 2022-07-15 DIAGNOSIS — G4733 Obstructive sleep apnea (adult) (pediatric): Secondary | ICD-10-CM | POA: Diagnosis not present

## 2022-07-18 NOTE — Procedures (Signed)
  [facility] Lateefa Crosby A. Gerilyn Pilgrim, MD     [email or web address]             NOCTURNAL POLYSOMNOGRAPHY   LOCATION: [facility]   Patient Name: Tiffany Everett, Tiffany Everett Date: 07/15/2022 Gender: Female D.O.B: 02-15-1941 Age (years): 68 Referring Provider: Beryle Beams MD, ABSM Height (inches): 68 Interpreting Physician: Beryle Beams MD, ABSM Weight (lbs): 175 RPSGT: Alfonso Ellis BMI: 27 MRN: [id number] Neck Size: 16.00 CLINICAL INFORMATION Sleep Study Type: NPSG     Indication for sleep study: Fatigue     Epworth Sleepiness Score: 9     SLEEP STUDY TECHNIQUE As per the AASM Manual for the Scoring of Sleep and Associated Events v2.3 (April 2016) with a hypopnea requiring 4% desaturations.  The channels recorded and monitored were frontal, central and occipital EEG, electrooculogram (EOG), submentalis EMG (chin), nasal and oral airflow, thoracic and abdominal wall motion, anterior tibialis EMG, snore microphone, electrocardiogram, and pulse oximetry.  MEDICATIONS Medications self-administered by patient taken the night of the study : N/A  Current Outpatient Medications:    aspirin EC 81 MG tablet, Take 1 tablet (81 mg total) by mouth daily with breakfast., Disp: 30 tablet, Rfl: 3   metoprolol succinate (TOPROL-XL) 25 MG 24 hr tablet, Take 25 mg by mouth daily., Disp: , Rfl:    simvastatin (ZOCOR) 10 MG tablet, Take 1 tablet by mouth daily., Disp: , Rfl:    venlafaxine XR (EFFEXOR-XR) 37.5 MG 24 hr capsule, Take 37.5 mg by mouth daily., Disp: , Rfl:    Vitamin D, Ergocalciferol, (DRISDOL) 1.25 MG (50000 UNIT) CAPS capsule, Take 50,000 Units by mouth every 7 (seven) days., Disp: , Rfl:       SLEEP ARCHITECTURE The study was initiated at 9:46:39 PM and ended at 4:46:58 AM.  Sleep onset time was 199.8 minutes and the sleep efficiency was 35.1%. The total sleep time was 147.6 minutes.  Stage REM latency was 70.0 minutes.  The patient spent 8.47% of the night  in stage N1 sleep, 45.79% in stage N2 sleep, 28.46% in stage N3 and 17.3% in REM.  Alpha intrusion was absent.  Supine sleep was 0.00%.  RESPIRATORY PARAMETERS The overall apnea/hypopnea index (AHI) was 4.1 per hour. There were 3 total apneas, including 2 obstructive, 1 central and 0 mixed apneas. There were 7 hypopneas and 0 RERAs.  The AHI during Stage REM sleep was 18.8 per hour.  AHI while supine was N/A per hour.  The mean oxygen saturation was 91.27%. The minimum SpO2 during sleep was 81.00%. Total time with oxygen saturation less than 88% is 12 minutes. Mostly these oxygen saturation were not associated with obstructive events.  soft snoring was noted during this study.  CARDIAC DATA The 2 lead EKG demonstrated sinus rhythm. The mean heart rate was 62.13 beats per minute. Other EKG findings include: None.  LEG MOVEMENT DATA The total PLMS were 44 with a resulting PLMS index of 17.89.   IMPRESSIONS Hypoventilation syndrome. Consider 2 L supplemental nocturnal oxygen. 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 No significant obstructive sleep apnea occurred during this study.   Argie Ramming, MD Diplomate, American Board of Sleep Medicine.  ELECTRONICALLY SIGNED ON:  07/18/2022, 4:19 PM [facility] PH: [phone]   FX: [phone] ACCREDITED BY THE AMERICAN ACADEMY OF SLEEP MEDICINE

## 2022-11-20 DIAGNOSIS — Z23 Encounter for immunization: Secondary | ICD-10-CM | POA: Diagnosis not present

## 2022-11-20 DIAGNOSIS — Z0000 Encounter for general adult medical examination without abnormal findings: Secondary | ICD-10-CM | POA: Diagnosis not present

## 2022-11-20 DIAGNOSIS — E782 Mixed hyperlipidemia: Secondary | ICD-10-CM | POA: Diagnosis not present

## 2022-11-20 DIAGNOSIS — I499 Cardiac arrhythmia, unspecified: Secondary | ICD-10-CM | POA: Diagnosis not present

## 2022-11-20 DIAGNOSIS — E049 Nontoxic goiter, unspecified: Secondary | ICD-10-CM | POA: Diagnosis not present

## 2022-11-20 DIAGNOSIS — E559 Vitamin D deficiency, unspecified: Secondary | ICD-10-CM | POA: Diagnosis not present

## 2023-05-23 DIAGNOSIS — G9332 Myalgic encephalomyelitis/chronic fatigue syndrome: Secondary | ICD-10-CM | POA: Diagnosis not present

## 2023-05-23 DIAGNOSIS — N1831 Chronic kidney disease, stage 3a: Secondary | ICD-10-CM | POA: Insufficient documentation

## 2024-02-24 ENCOUNTER — Emergency Department (HOSPITAL_COMMUNITY)

## 2024-02-24 ENCOUNTER — Inpatient Hospital Stay (HOSPITAL_COMMUNITY)
Admission: EM | Admit: 2024-02-24 | Discharge: 2024-02-27 | DRG: 522 | Disposition: A | Discharge status: Skilled Nursing Facility | Attending: Internal Medicine | Admitting: Internal Medicine

## 2024-02-24 ENCOUNTER — Other Ambulatory Visit: Payer: Self-pay

## 2024-02-24 ENCOUNTER — Encounter (HOSPITAL_COMMUNITY): Payer: Self-pay | Admitting: *Deleted

## 2024-02-24 DIAGNOSIS — Z800 Family history of malignant neoplasm of digestive organs: Secondary | ICD-10-CM | POA: Diagnosis not present

## 2024-02-24 DIAGNOSIS — Z7982 Long term (current) use of aspirin: Secondary | ICD-10-CM

## 2024-02-24 DIAGNOSIS — F419 Anxiety disorder, unspecified: Secondary | ICD-10-CM | POA: Diagnosis not present

## 2024-02-24 DIAGNOSIS — R2681 Unsteadiness on feet: Secondary | ICD-10-CM | POA: Diagnosis not present

## 2024-02-24 DIAGNOSIS — I700 Atherosclerosis of aorta: Secondary | ICD-10-CM | POA: Diagnosis not present

## 2024-02-24 DIAGNOSIS — R030 Elevated blood-pressure reading, without diagnosis of hypertension: Secondary | ICD-10-CM

## 2024-02-24 DIAGNOSIS — S0990XA Unspecified injury of head, initial encounter: Secondary | ICD-10-CM | POA: Diagnosis not present

## 2024-02-24 DIAGNOSIS — M50321 Other cervical disc degeneration at C4-C5 level: Secondary | ICD-10-CM | POA: Diagnosis not present

## 2024-02-24 DIAGNOSIS — F339 Major depressive disorder, recurrent, unspecified: Secondary | ICD-10-CM | POA: Diagnosis not present

## 2024-02-24 DIAGNOSIS — Z471 Aftercare following joint replacement surgery: Secondary | ICD-10-CM | POA: Diagnosis not present

## 2024-02-24 DIAGNOSIS — M47812 Spondylosis without myelopathy or radiculopathy, cervical region: Secondary | ICD-10-CM | POA: Diagnosis not present

## 2024-02-24 DIAGNOSIS — G238 Other specified degenerative diseases of basal ganglia: Secondary | ICD-10-CM | POA: Diagnosis not present

## 2024-02-24 DIAGNOSIS — E559 Vitamin D deficiency, unspecified: Secondary | ICD-10-CM | POA: Diagnosis present

## 2024-02-24 DIAGNOSIS — Z860100 Personal history of colon polyps, unspecified: Secondary | ICD-10-CM

## 2024-02-24 DIAGNOSIS — Z9181 History of falling: Secondary | ICD-10-CM | POA: Diagnosis not present

## 2024-02-24 DIAGNOSIS — F32A Depression, unspecified: Secondary | ICD-10-CM | POA: Diagnosis not present

## 2024-02-24 DIAGNOSIS — K219 Gastro-esophageal reflux disease without esophagitis: Secondary | ICD-10-CM | POA: Diagnosis present

## 2024-02-24 DIAGNOSIS — R488 Other symbolic dysfunctions: Secondary | ICD-10-CM | POA: Diagnosis not present

## 2024-02-24 DIAGNOSIS — M4313 Spondylolisthesis, cervicothoracic region: Secondary | ICD-10-CM | POA: Diagnosis not present

## 2024-02-24 DIAGNOSIS — W010XXA Fall on same level from slipping, tripping and stumbling without subsequent striking against object, initial encounter: Secondary | ICD-10-CM | POA: Diagnosis present

## 2024-02-24 DIAGNOSIS — Z79899 Other long term (current) drug therapy: Secondary | ICD-10-CM | POA: Diagnosis not present

## 2024-02-24 DIAGNOSIS — S72002A Fracture of unspecified part of neck of left femur, initial encounter for closed fracture: Principal | ICD-10-CM | POA: Diagnosis present

## 2024-02-24 DIAGNOSIS — I10 Essential (primary) hypertension: Secondary | ICD-10-CM | POA: Diagnosis not present

## 2024-02-24 DIAGNOSIS — M47813 Spondylosis without myelopathy or radiculopathy, cervicothoracic region: Secondary | ICD-10-CM | POA: Diagnosis not present

## 2024-02-24 DIAGNOSIS — Z791 Long term (current) use of non-steroidal anti-inflammatories (NSAID): Secondary | ICD-10-CM

## 2024-02-24 DIAGNOSIS — E785 Hyperlipidemia, unspecified: Secondary | ICD-10-CM | POA: Diagnosis present

## 2024-02-24 DIAGNOSIS — G9389 Other specified disorders of brain: Secondary | ICD-10-CM | POA: Diagnosis not present

## 2024-02-24 DIAGNOSIS — M8580 Other specified disorders of bone density and structure, unspecified site: Secondary | ICD-10-CM | POA: Diagnosis not present

## 2024-02-24 DIAGNOSIS — Z743 Need for continuous supervision: Secondary | ICD-10-CM | POA: Diagnosis not present

## 2024-02-24 DIAGNOSIS — W19XXXA Unspecified fall, initial encounter: Principal | ICD-10-CM

## 2024-02-24 DIAGNOSIS — S72002D Fracture of unspecified part of neck of left femur, subsequent encounter for closed fracture with routine healing: Secondary | ICD-10-CM | POA: Diagnosis not present

## 2024-02-24 DIAGNOSIS — M25552 Pain in left hip: Secondary | ICD-10-CM | POA: Diagnosis not present

## 2024-02-24 DIAGNOSIS — R079 Chest pain, unspecified: Secondary | ICD-10-CM | POA: Diagnosis not present

## 2024-02-24 DIAGNOSIS — Z7901 Long term (current) use of anticoagulants: Secondary | ICD-10-CM

## 2024-02-24 DIAGNOSIS — R55 Syncope and collapse: Secondary | ICD-10-CM | POA: Diagnosis not present

## 2024-02-24 DIAGNOSIS — Z96642 Presence of left artificial hip joint: Secondary | ICD-10-CM | POA: Diagnosis not present

## 2024-02-24 DIAGNOSIS — M6281 Muscle weakness (generalized): Secondary | ICD-10-CM | POA: Diagnosis not present

## 2024-02-24 DIAGNOSIS — F411 Generalized anxiety disorder: Secondary | ICD-10-CM | POA: Diagnosis not present

## 2024-02-24 LAB — BASIC METABOLIC PANEL
Anion gap: 11 (ref 5–15)
BUN: 20 mg/dL (ref 8–23)
CO2: 24 mmol/L (ref 22–32)
Calcium: 9.3 mg/dL (ref 8.9–10.3)
Chloride: 104 mmol/L (ref 98–111)
Creatinine, Ser: 0.83 mg/dL (ref 0.44–1.00)
GFR, Estimated: >60 mL/min (ref >60)
Glucose, Bld: 119 mg/dL — ABNORMAL HIGH (ref 70–99)
Potassium: 3.5 mmol/L (ref 3.5–5.1)
Sodium: 139 mmol/L (ref 135–145)

## 2024-02-24 LAB — TSH: TSH: 1.578 u[IU]/mL (ref 0.350–4.500)

## 2024-02-24 LAB — CBC
HCT: 50.1 % — ABNORMAL HIGH (ref 36.0–46.0)
Hemoglobin: 16.4 g/dL — ABNORMAL HIGH (ref 12.0–15.0)
MCH: 29.2 pg (ref 26.0–34.0)
MCHC: 32.7 g/dL (ref 30.0–36.0)
MCV: 89.3 fL (ref 80.0–100.0)
Platelets: 164 10*3/uL (ref 150–400)
RBC: 5.61 MIL/uL — ABNORMAL HIGH (ref 3.87–5.11)
RDW: 13.7 % (ref 11.5–15.5)
WBC: 8.8 10*3/uL (ref 4.0–10.5)
nRBC: 0 % (ref 0.0–0.2)

## 2024-02-24 LAB — VITAMIN D 25 HYDROXY (VIT D DEFICIENCY, FRACTURES): Vit D, 25-Hydroxy: 97.88 ng/mL (ref 30–100)

## 2024-02-24 MED ORDER — ONDANSETRON HCL 4 MG/2ML IJ SOLN
4.0000 mg | Freq: Once | INTRAMUSCULAR | Status: AC
  Administered 2024-02-24: 4 mg via INTRAVENOUS
  Filled 2024-02-24: qty 2

## 2024-02-24 MED ORDER — MORPHINE SULFATE (PF) 4 MG/ML IV SOLN
4.0000 mg | Freq: Once | INTRAVENOUS | Status: AC
  Administered 2024-02-24: 4 mg via INTRAVENOUS
  Filled 2024-02-24: qty 1

## 2024-02-24 MED ORDER — OXYCODONE HCL 5 MG PO TABS
5.0000 mg | ORAL_TABLET | Freq: Four times a day (QID) | ORAL | Status: DC | PRN
Start: 2024-02-24 — End: 2024-02-25
  Administered 2024-02-24 – 2024-02-25 (×2): 5 mg via ORAL
  Filled 2024-02-24 (×2): qty 1

## 2024-02-24 MED ORDER — HYDRALAZINE HCL 20 MG/ML IJ SOLN: 10.0000 mg | Freq: Three times a day (TID) | INTRAMUSCULAR | Status: DC | PRN

## 2024-02-24 MED ORDER — SIMVASTATIN 10 MG PO TABS
10.0000 mg | ORAL_TABLET | Freq: Every day | ORAL | Status: DC
  Administered 2024-02-24 – 2024-02-27 (×3): 10 mg via ORAL
  Filled 2024-02-24 (×3): qty 1

## 2024-02-24 MED ORDER — METHOCARBAMOL 500 MG PO TABS
500.0000 mg | ORAL_TABLET | Freq: Four times a day (QID) | ORAL | Status: DC | PRN
  Administered 2024-02-24 – 2024-02-25 (×2): 500 mg via ORAL
  Filled 2024-02-24 (×2): qty 1

## 2024-02-24 MED ORDER — HYDROMORPHONE HCL 1 MG/ML IJ SOLN
1.0000 mg | INTRAMUSCULAR | Status: DC | PRN
  Administered 2024-02-24: 1 mg via INTRAVENOUS
  Administered 2024-02-25: .5 mg via INTRAVENOUS
  Filled 2024-02-24: qty 1

## 2024-02-24 MED ORDER — MORPHINE SULFATE (PF) 2 MG/ML IV SOLN
2.0000 mg | Freq: Once | INTRAVENOUS | Status: AC
  Administered 2024-02-24: 2 mg via INTRAVENOUS
  Filled 2024-02-24: qty 1

## 2024-02-24 MED ORDER — METOPROLOL SUCCINATE ER 25 MG PO TB24
25.0000 mg | ORAL_TABLET | Freq: Every day | ORAL | Status: DC
  Administered 2024-02-24 – 2024-02-27 (×4): 25 mg via ORAL
  Filled 2024-02-24 (×4): qty 1

## 2024-02-24 MED ORDER — VENLAFAXINE HCL ER 37.5 MG PO CP24
37.5000 mg | ORAL_CAPSULE | Freq: Every day | ORAL | Status: DC
  Administered 2024-02-24 – 2024-02-27 (×3): 37.5 mg via ORAL
  Filled 2024-02-24 (×5): qty 1

## 2024-02-24 MED ORDER — INFLUENZA VAC A&B SURF ANT ADJ 0.5 ML IM SUSY: 0.5000 mL | PREFILLED_SYRINGE | INTRAMUSCULAR | Status: DC

## 2024-02-24 MED ORDER — METHOCARBAMOL 1000 MG/10ML IJ SOLN: 500.0000 mg | Freq: Four times a day (QID) | INTRAMUSCULAR | Status: DC | PRN

## 2024-02-24 MED ORDER — PNEUMOCOCCAL 20-VAL CONJ VACC 0.5 ML IM SUSY: 0.5000 mL | PREFILLED_SYRINGE | INTRAMUSCULAR | Status: DC

## 2024-02-24 MED ORDER — MUPIROCIN 2 % EX OINT
1.0000 | TOPICAL_OINTMENT | Freq: Two times a day (BID) | CUTANEOUS | Status: DC
  Administered 2024-02-24 – 2024-02-27 (×5): 1 via NASAL
  Filled 2024-02-24: qty 22

## 2024-02-24 NOTE — Consult Note (Signed)
 Consult orthopedic service  Dr. Romeo Apple requested by Dr. Odis Hollingshead Tiffany Everett is an 83 y.o. female.  HPI: The patient is 83 years old she is ambulatory she may be starting to undergo some changes in mental status with dementia but was coherent when I met with her.  She says she was going to the bathroom this morning on February 24, 2024 hit something on the floor or missed a step and fell down.  She was eventually found and brought in to the hospital by EMS.  She complained of severe left hip pain and inability to ambulate.  Imaging and radiographic studies confirm that she had a left femoral neck fracture.  She does have some comorbidities listed below which contribute to the decision making process regarding proper and best treatment    Past Medical History:  Diagnosis Date   Colon polyps    Hypertension    Seasonal allergies     Past Surgical History:  Procedure Laterality Date   APPENDECTOMY     CATARACT EXTRACTION W/PHACO Right 09/13/2013   Procedure: CATARACT EXTRACTION PHACO AND INTRAOCULAR LENS PLACEMENT (IOC);  Surgeon: Susa Simmonds, MD;  Location: [facility];  Service: Ophthalmology;  Laterality: Right;  CDE:3.25   CATARACT EXTRACTION W/PHACO Left 11/22/2013   Procedure: CATARACT EXTRACTION PHACO AND INTRAOCULAR LENS PLACEMENT (IOC);  Surgeon: Susa Simmonds, MD;  Location: [facility];  Service: Ophthalmology;  Laterality: Left;  CDE: 3.95   CESAREAN SECTION     x2   COLONOSCOPY     SLF 2010: sigmoid diverticula, otherwise wnl. Repeat 5 yrs.   I & D EXTREMITY Left    lateral wrist   LAPAROTOMY     oopherectomy   ORIF HUMERUS FRACTURE Left     Family History  Problem Relation Age of Onset   Colon cancer Mother 45   Colon cancer Father 78    Social History:  reports that she has never smoked. She has never used smokeless tobacco. She reports that she does not drink alcohol and does not use drugs.  Allergies: No Known Allergies  Medications: I have reviewed the  patient's current medications.  Results for orders placed or performed during the hospital encounter of 02/24/24 (from the past 48 hours)  Basic metabolic panel     Status: Abnormal   Collection Time: 02/24/24  8:18 AM  Result Value Ref Range   Sodium 139 135 - 145 mmol/L   Potassium 3.5 3.5 - 5.1 mmol/L   Chloride 104 98 - 111 mmol/L   CO2 24 22 - 32 mmol/L   Glucose, Bld 119 (H) 70 - 99 mg/dL    Comment: Glucose reference range applies only to samples taken after fasting for at least 8 hours.   BUN 20 8 - 23 mg/dL   Creatinine, Ser 1.61 0.44 - 1.00 mg/dL   Calcium 9.3 8.9 - 09.6 mg/dL   GFR, Estimated >04 >54 mL/min    Comment: (NOTE) Calculated using the CKD-EPI Creatinine Equation (2021)    Anion gap 11 5 - 15    Comment: Performed at [facility], [address], [place], [place] [address]  CBC     Status: Abnormal   Collection Time: 02/24/24  8:18 AM  Result Value Ref Range   WBC 8.8 4.0 - 10.5 K/uL   RBC 5.61 (H) 3.87 - 5.11 MIL/uL   Hemoglobin 16.4 (H) 12.0 - 15.0 g/dL   HCT 91.4 (H) 78.2 - 95.6 %   MCV 89.3 80.0 -  100.0 fL   MCH 29.2 26.0 - 34.0 pg   MCHC 32.7 30.0 - 36.0 g/dL   RDW 14.7 82.9 - 56.2 %   Platelets 164 150 - 400 K/uL   nRBC 0.0 0.0 - 0.2 %    Comment: Performed at [facility], [address], [place], [place] [address]  TSH     Status: None   Collection Time: 02/24/24  2:09 PM  Result Value Ref Range   TSH 1.578 0.350 - 4.500 uIU/mL    Comment: Performed by a 3rd Generation assay with a functional sensitivity of <=0.01 uIU/mL. Performed at [facility], [address], [place], [place] [address]   VITAMIN D 25 Hydroxy (Vit-D Deficiency, Fractures)     Status: None   Collection Time: 02/24/24  2:09 PM  Result Value Ref Range   Vit D, 25-Hydroxy 97.88 30 - 100 ng/mL    Comment: (NOTE) Vitamin D deficiency has been defined by the Institute of Medicine  and an Endocrine Society practice guideline as a level of serum 25-OH  vitamin D less than 20  ng/mL (1,2). The Endocrine Society went on to  further define vitamin D insufficiency as a level between 21 and 29  ng/mL (2).  1. IOM (Institute of Medicine). 2010. Dietary reference intakes for  calcium and D. [place]: [employer]. 2. Holick MF, Binkley [place], Bischoff-Ferrari HA, et al. Evaluation,  treatment, and prevention of vitamin D deficiency: an Endocrine  Society clinical practice guideline, JCEM. 2011 Jul; 96(7): 1911-30.  Performed at [facility], [address], [place], [place] [address]   The radiographic imaging studies below have been reviewed.  I have personally read the hip and pelvic x-rays.  Patient has a completely displaced left femoral neck fracture with no obvious signs of arthritis of the left hip  Report of chest x-ray shows no active disease  Report on the CT scan of the cervical spine shows chronic disease  CT scan of the head shows also chronic changes with no acute fracture or hematoma  DG Chest 1 View Result Date: 02/24/2024 CLINICAL DATA:  Pain after fall EXAM: CHEST  1 VIEW COMPARISON:  Chest x-ray 02/01/2020. FINDINGS: Calcified aorta. Normal cardiopericardial silhouette. No edema. No consolidation, pneumothorax or effusion. Overlapping cardiac leads. Degenerative changes of the spine. Old lower right rib fractures. Hardware along the left humerus at the area edge of the imaging field. IMPRESSION: No acute cardiopulmonary disease. Electronically Signed   By: Karen Kays M.D.   On: 02/24/2024 10:42   DG HIP UNILAT W OR W/O PELVIS 2-3 VIEWS LEFT Result Date: 02/24/2024 CLINICAL DATA:  Fall and left hip pain. EXAM: DG HIP (WITH OR WITHOUT PELVIS) 2-3V LEFT COMPARISON:  Pelvic radiograph dated 02/01/2020. FINDINGS: There is a mildly displaced and impacted fracture of the left femoral neck. No dislocation. The bones are osteopenic. The soft tissues are unremarkable. IMPRESSION: Mildly displaced and impacted fracture of the left  femoral neck. Electronically Signed   By: Elgie Collard M.D.   On: 02/24/2024 10:41   CT Cervical Spine Wo Contrast Result Date: 02/24/2024 CLINICAL DATA:  83 year old female with syncope wall walking to the bathroom. Struck head on floor. EXAM: CT CERVICAL SPINE WITHOUT CONTRAST TECHNIQUE: Multidetector CT imaging of the cervical spine was performed without intravenous contrast. Multiplanar CT image reconstructions were also generated. RADIATION DOSE REDUCTION: This exam was performed according to the departmental dose-optimization program which includes automated exposure control, adjustment of the mA  and/or kV according to patient size and/or use of iterative reconstruction technique. COMPARISON:  Head CT today.  Cervical spine CT 02/01/2020. FINDINGS: Alignment: Stable since 2021. Chronic straightening of cervical lordosis with mild degenerative appearing anterolisthesis at C7-T1. Stable bilateral posterior element alignment. Skull base and vertebrae: Bone mineralization is within normal limits for age. Visualized skull base is intact. No atlanto-occipital dissociation. C1 and C2 appear intact and aligned. No acute osseous abnormality identified. Soft tissues and spinal canal: No prevertebral fluid or swelling. No visible canal hematoma. Bulky calcified atherosclerosis of the left carotid artery. Faintly visible chronic bulky right submandibular gland sialolith. Disc levels: Advanced cervical spine degeneration superimposed on degenerative C2-C3 facet ankylosis on the right. Severe multilevel right cervical facet arthropathy. Bulky disc and endplate degeneration C4-C5 through C6-C7. And facet arthropathy associated with mild anterolisthesis at the cervicothoracic junction. However, fairly capacious CT appearance of the underlying cervical spinal canal. No strong CT evidence of cervical spinal stenosis. Upper chest: Visible upper thoracic levels appear intact. Chronic apical lung scarring appears stable.  IMPRESSION: 1. No acute traumatic injury identified in the cervical spine. 2. Chronically advanced cervical spine degeneration but no strong CT evidence of cervical spinal stenosis. Electronically Signed   By: Odessa Fleming M.D.   On: 02/24/2024 09:29   CT Head Wo Contrast Result Date: 02/24/2024 CLINICAL DATA:  83 year old female with syncope wall walking to the bathroom. Struck head on floor. EXAM: CT HEAD WITHOUT CONTRAST TECHNIQUE: Contiguous axial images were obtained from the base of the skull through the vertex without intravenous contrast. RADIATION DOSE REDUCTION: This exam was performed according to the departmental dose-optimization program which includes automated exposure control, adjustment of the mA and/or kV according to patient size and/or use of iterative reconstruction technique. COMPARISON:  Brain MRI 06/23/2020.  Head CT 02/01/2020. FINDINGS: Brain: Advanced chronic ischemic disease in the bilateral basal ganglia, left thalamus, right MCA anterior or middle division appears stable since 2021. Confluent additional bilateral cerebral white matter hypodensity has not significantly changed. Chronic dystrophic calcifications in the inferior basal ganglia bilaterally. And similar dystrophic appearing calcification in the left cerebellum which is partially stellate and linear (series 5 images 35-37). No regional edema or mass effect. No midline shift, ventriculomegaly, mass effect, evidence of mass lesion, intracranial hemorrhage or evidence of cortically based acute infarction. Vascular: Calcified atherosclerosis at the skull base. No suspicious intracranial vascular hyperdensity. Skull: Intact.  No acute osseous abnormality identified. Sinuses/Orbits: Chronic bilateral paranasal sinus mucoperiosteal thickening does not appear significantly changed. Tympanic cavities and mastoids remain well aerated. Other: No acute orbit or scalp soft tissue injury is identified. IMPRESSION: 1. No acute traumatic injury  identified. 2.  No acute intracranial abnormality. 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 Associated chronic dystrophic calcifications in the bilateral basal ganglia, and similar dystrophic calcifications now in the left deep cerebellar nuclei. 3. Chronic paranasal sinus disease. Electronically Signed   By: Odessa Fleming M.D.   On: 02/24/2024 09:26    Review of Systems Blood pressure (!) 153/99, pulse 78, temperature 98 F (36.7 C), temperature source Oral, resp. rate 16, height 5\' 6"  (1.676 m), weight 71.3 kg, SpO2 98%. Physical Exam Vitals and nursing note reviewed.  Constitutional:      General: She is not in acute distress.    Appearance: Normal appearance. She is normal weight. She is not ill-appearing, toxic-appearing or diaphoretic.  HENT:     Head: Normocephalic and atraumatic.     Right Ear:  External ear normal.     Left Ear: External ear normal.     Nose: Nose normal. No congestion or rhinorrhea.     Mouth/Throat:     Mouth: Mucous membranes are moist.     Pharynx: No oropharyngeal exudate or posterior oropharyngeal erythema.  Eyes:     General: No scleral icterus.       Right eye: No discharge.        Left eye: No discharge.     Extraocular Movements: Extraocular movements intact.     Conjunctiva/sclera: Conjunctivae normal.     Pupils: Pupils are equal, round, and reactive to light.  Cardiovascular:     Rate and Rhythm: Normal rate and regular rhythm.     Heart sounds: Normal heart sounds.  Pulmonary:     Effort: Pulmonary effort is normal. No respiratory distress.     Breath sounds: Normal breath sounds. No stridor.  Chest:     Chest wall: No tenderness.  Abdominal:     General: Abdomen is flat. Bowel sounds are normal. There is no distension.     Palpations: Abdomen is soft. There is no mass.     Tenderness: There is no abdominal tenderness.  Musculoskeletal:     Cervical back: Normal range of motion and neck supple.  No rigidity or tenderness.     Comments: Right upper extremity/left upper extremity/right lower extremity  Skin: Some areas have some bruising nothing appears to be significant  Tenderness none  Range of motion normal functional  Instability: Normal no subluxation revealed.  Muscle tone normal motor function normal  Left lower extremity: Tenderness yes greater trochanter proximal thigh with some swelling skin intact range of motion assessment deferred because of pain instability none on x-ray.  No instability testing in the hip.  Knee and ankle are reduced  Muscle tone normal no tremors.  Lymphadenopathy:     Cervical: No cervical adenopathy.  Skin:    General: Skin is warm and dry.     Capillary Refill: Capillary refill takes less than 2 seconds.  Neurological:     General: No focal deficit present.     Mental Status: She is alert and oriented to person, place, and time.     Cranial Nerves: No cranial nerve deficit.     Sensory: No sensory deficit.     Motor: No weakness.     Gait: Gait abnormal.     Deep Tendon Reflexes: Reflexes normal.  Psychiatric:        Mood and Affect: Mood normal.        Behavior: Behavior normal.        Thought Content: Thought content normal.        Judgment: Judgment normal.     Assessment/Plan: I discussed the situation with the patient and her son and advised bipolar hip replacement.  This is associated with some risks which include but not limited to leg length discrepancy persistent limp thigh pain blood clot infection  Several interventions will be done to mitigate these risks but it is not possible to completely eliminate them  Diagnosis left femoral neck fracture displaced  Plan left bipolar hip replacement  Tiffany Everett 02/24/2024, 6:51 PM

## 2024-02-24 NOTE — ED Provider Notes (Addendum)
 [place] [facility] AT [facility] Provider Note   CSN: [id number] Arrival date & time: 02/24/24  0757     History  Chief Complaint  Patient presents with   Tiffany Everett    [name] is a 83 y.o. female.  Pt with c/o syncope and fall this AM - pt got up to go into bathroom and indicates passed out. Post fall c/o left hip pain, constant, dull, non radiating. Indicates when got up today felt fine. Had not yet used bathroom when episode occurred. Denies headache. No neck/back pain. No chest pain or discomfort. No sob or unusual doe. No palpitations. No abd pain or nvd. No rectal bleeding or melena. No dysuria or gu c/o. No other extremity pain or injury. No anticoagulant use. Family member heard patient fall/call out and went in to assist patient.   The history is provided by the patient, medical records and the EMS personnel.  Fall Pertinent negatives include no chest pain, no abdominal pain, no headaches and no shortness of breath.       Home Medications Prior to Admission medications   Medication Sig Start Date End Date Taking? Authorizing Provider  aspirin EC 81 MG tablet Take 1 tablet (81 mg total) by mouth daily with breakfast. 02/03/20   Shon Hale, MD  metoprolol succinate (TOPROL-XL) 25 MG 24 hr tablet Take 25 mg by mouth daily. 06/16/20   [provider]  simvastatin (ZOCOR) 10 MG tablet Take 1 tablet by mouth daily. 03/31/21   [provider]  venlafaxine XR (EFFEXOR-XR) 37.5 MG 24 hr capsule Take 37.5 mg by mouth daily. 05/22/21   [provider]  Vitamin D, Ergocalciferol, (DRISDOL) 1.25 MG (50000 UNIT) CAPS capsule Take 50,000 Units by mouth every 7 (seven) days.    [provider]      Allergies    Patient has no known allergies.    Review of Systems   Review of Systems  Constitutional:  Negative for fever.  HENT:  Negative for sore throat.   Eyes:  Negative for redness and visual disturbance.   Respiratory:  Negative for cough and shortness of breath.   Cardiovascular:  Negative for chest pain, palpitations and leg swelling.  Gastrointestinal:  Negative for abdominal pain, diarrhea and vomiting.  Genitourinary:  Negative for dysuria and flank pain.  Musculoskeletal:  Negative for back pain and neck pain.  Skin:  Negative for rash.  Neurological:  Negative for speech difficulty, weakness, numbness and headaches.  Hematological:  Does not bruise/bleed easily.    Physical Exam Updated Vital Signs BP (!) 189/68   Pulse 72   Resp 17   LMP  (LMP Unknown)   SpO2 94%  Physical Exam Vitals and nursing note reviewed.  Constitutional:      Appearance: Normal appearance. She is well-developed.  HENT:     Head: Atraumatic.     Nose: Nose normal.     Mouth/Throat:     Mouth: Mucous membranes are moist.  Eyes:     General: No scleral icterus.    Conjunctiva/sclera: Conjunctivae normal.     Pupils: Pupils are equal, round, and reactive to light.  Neck:     Vascular: No carotid bruit.     Trachea: No tracheal deviation.  Cardiovascular:     Rate and Rhythm: Normal rate and regular rhythm.     Pulses: Normal pulses.     Heart sounds: Normal heart sounds. No murmur heard.    No friction rub.  No gallop.  Pulmonary:     Effort: Pulmonary effort is normal. No respiratory distress.     Breath sounds: Normal breath sounds.  Chest:     Chest wall: No tenderness.  Abdominal:     General: Bowel sounds are normal. There is no distension.     Palpations: Abdomen is soft. There is no mass.     Tenderness: There is no abdominal tenderness. There is no guarding.  Genitourinary:    Comments: No cva tenderness.  Musculoskeletal:     Cervical back: Normal range of motion and neck supple. No rigidity. No muscular tenderness.     Comments: Tenderness and pain w movement left hip. No gross shortening or deformity. LLE is of normal color and warmth. Distal pulses palp. Otherwise good rom bil  extremities without other pain or focal bony tenderness. Mild mid cervical tenderness, otherwise CTLS spine, non tender, aligned, no step off.   Skin:    General: Skin is warm and dry.     Findings: No rash.  Neurological:     Mental Status: She is alert.     Comments: Alert, speech normal. GCS 15. Motor/sens grossly intact bil, stre 5/5.   Psychiatric:        Mood and Affect: Mood normal.     ED Results / Procedures / Treatments   Labs (all labs ordered are listed, but only abnormal results are displayed) Results for orders placed or performed during the hospital encounter of 02/24/24  Basic metabolic panel   Collection Time: 02/24/24  8:18 AM  Result Value Ref Range   Sodium 139 135 - 145 mmol/L   Potassium 3.5 3.5 - 5.1 mmol/L   Chloride 104 98 - 111 mmol/L   CO2 24 22 - 32 mmol/L   Glucose, Bld 119 (H) 70 - 99 mg/dL   BUN 20 8 - 23 mg/dL   Creatinine, Ser 1.61 0.44 - 1.00 mg/dL   Calcium 9.3 8.9 - 09.6 mg/dL   GFR, Estimated >04 >54 mL/min   Anion gap 11 5 - 15  CBC   Collection Time: 02/24/24  8:18 AM  Result Value Ref Range   WBC 8.8 4.0 - 10.5 K/uL   RBC 5.61 (H) 3.87 - 5.11 MIL/uL   Hemoglobin 16.4 (H) 12.0 - 15.0 g/dL   HCT 09.8 (H) 11.9 - 14.7 %   MCV 89.3 80.0 - 100.0 fL   MCH 29.2 26.0 - 34.0 pg   MCHC 32.7 30.0 - 36.0 g/dL   RDW 82.9 56.2 - 13.0 %   Platelets 164 150 - 400 K/uL   nRBC 0.0 0.0 - 0.2 %      EKG EKG Interpretation Date/Time:  Tuesday February 24 2024 08:14:33 EDT Ventricular Rate:  64 PR Interval:  177 QRS Duration:  109 QT Interval:  480 QTC Calculation: 496 R Axis:   0  Text Interpretation: Sinus rhythm LVH with secondary repolarization abnormality Borderline prolonged QT interval Confirmed by Cathren Laine ([id number]) on 02/24/2024 9:41:59 AM  Radiology DG Chest 1 View Result Date: 02/24/2024 CLINICAL DATA:  Pain after fall EXAM: CHEST  1 VIEW COMPARISON:  Chest x-ray 02/01/2020. FINDINGS: Calcified aorta. Normal cardiopericardial  silhouette. No edema. No consolidation, pneumothorax or effusion. Overlapping cardiac leads. Degenerative changes of the spine. Old lower right rib fractures. Hardware along the left humerus at the area edge of the imaging field. IMPRESSION: No acute cardiopulmonary disease. Electronically Signed   By: Karen Kays M.D.   On: 02/24/2024 10:42  DG HIP UNILAT W OR W/O PELVIS 2-3 VIEWS LEFT Result Date: 02/24/2024 CLINICAL DATA:  Fall and left hip pain. EXAM: DG HIP (WITH OR WITHOUT PELVIS) 2-3V LEFT COMPARISON:  Pelvic radiograph dated 02/01/2020. FINDINGS: There is a mildly displaced and impacted fracture of the left femoral neck. No dislocation. The bones are osteopenic. The soft tissues are unremarkable. IMPRESSION: Mildly displaced and impacted fracture of the left femoral neck. Electronically Signed   By: Elgie Collard M.D.   On: 02/24/2024 10:41   CT Cervical Spine Wo Contrast Result Date: 02/24/2024 CLINICAL DATA:  83 year old female with syncope wall walking to the bathroom. Struck head on floor. EXAM: CT CERVICAL SPINE WITHOUT CONTRAST TECHNIQUE: Multidetector CT imaging of the cervical spine was performed without intravenous contrast. Multiplanar CT image reconstructions were also generated. RADIATION DOSE REDUCTION: This exam was performed according to the departmental dose-optimization program which includes automated exposure control, adjustment of the mA and/or kV according to patient size and/or use of iterative reconstruction technique. COMPARISON:  Head CT today.  Cervical spine CT 02/01/2020. FINDINGS: Alignment: Stable since 2021. Chronic straightening of cervical lordosis with mild degenerative appearing anterolisthesis at C7-T1. Stable bilateral posterior element alignment. Skull base and vertebrae: Bone mineralization is within normal limits for age. Visualized skull base is intact. No atlanto-occipital dissociation. C1 and C2 appear intact and aligned. No acute osseous abnormality  identified. Soft tissues and spinal canal: No prevertebral fluid or swelling. No visible canal hematoma. Bulky calcified atherosclerosis of the left carotid artery. Faintly visible chronic bulky right submandibular gland sialolith. Disc levels: Advanced cervical spine degeneration superimposed on degenerative C2-C3 facet ankylosis on the right. Severe multilevel right cervical facet arthropathy. Bulky disc and endplate degeneration C4-C5 through C6-C7. And facet arthropathy associated with mild anterolisthesis at the cervicothoracic junction. However, fairly capacious CT appearance of the underlying cervical spinal canal. No strong CT evidence of cervical spinal stenosis. Upper chest: Visible upper thoracic levels appear intact. Chronic apical lung scarring appears stable. IMPRESSION: 1. No acute traumatic injury identified in the cervical spine. 2. Chronically advanced cervical spine degeneration but no strong CT evidence of cervical spinal stenosis. Electronically Signed   By: Odessa Fleming M.D.   On: 02/24/2024 09:29   CT Head Wo Contrast Result Date: 02/24/2024 CLINICAL DATA:  83 year old female with syncope wall walking to the bathroom. Struck head on floor. EXAM: CT HEAD WITHOUT CONTRAST TECHNIQUE: Contiguous axial images were obtained from the base of the skull through the vertex without intravenous contrast. RADIATION DOSE REDUCTION: This exam was performed according to the departmental dose-optimization program which includes automated exposure control, adjustment of the mA and/or kV according to patient size and/or use of iterative reconstruction technique. COMPARISON:  Brain MRI 06/23/2020.  Head CT 02/01/2020. FINDINGS: Brain: Advanced chronic ischemic disease in the bilateral basal ganglia, left thalamus, right MCA anterior or middle division appears stable since 2021. Confluent additional bilateral cerebral white matter hypodensity has not significantly changed. Chronic dystrophic calcifications in the  inferior basal ganglia bilaterally. And similar dystrophic appearing calcification in the left cerebellum which is partially stellate and linear (series 5 images 35-37). No regional edema or mass effect. No midline shift, ventriculomegaly, mass effect, evidence of mass lesion, intracranial hemorrhage or evidence of cortically based acute infarction. Vascular: Calcified atherosclerosis at the skull base. No suspicious intracranial vascular hyperdensity. Skull: Intact.  No acute osseous abnormality identified. Sinuses/Orbits: Chronic bilateral paranasal sinus mucoperiosteal thickening does not appear significantly changed. Tympanic cavities and mastoids remain well aerated. Other: No  acute orbit or scalp soft tissue injury is identified. IMPRESSION: 1. No acute traumatic injury identified. 2.  No acute intracranial abnormality. 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 Associated chronic dystrophic calcifications in the bilateral basal ganglia, and similar dystrophic calcifications now in the left deep cerebellar nuclei. 3. Chronic paranasal sinus disease. Electronically Signed   By: Odessa Fleming M.D.   On: 02/24/2024 09:26    Procedures Procedures    Medications Ordered in ED Medications  morphine (PF) 4 MG/ML injection 4 mg (has no administration in time range)  ondansetron (ZOFRAN) injection 4 mg (has no administration in time range)  morphine (PF) 2 MG/ML injection 2 mg (2 mg Intravenous Given 02/24/24 0855)    ED Course/ Medical Decision Making/ A&P                                 Medical Decision Making Problems Addressed: Accidental fall, initial encounter: acute illness or injury with systemic symptoms that poses a threat to life or bodily functions Closed displaced fracture of left femoral neck (HCC): acute illness or injury with systemic symptoms that poses a threat to life or bodily functions Elevated blood pressure reading: acute illness or  injury Essential hypertension: chronic illness or injury with exacerbation, progression, or side effects of treatment that poses a threat to life or bodily functions Syncope, unspecified syncope type: acute illness or injury with systemic symptoms that poses a threat to life or bodily functions  Amount and/or Complexity of Data Reviewed Independent Historian: EMS    Details: Ems/fam, hx External Data Reviewed: notes. Labs: ordered. Decision-making details documented in ED Course. Radiology: ordered and independent interpretation performed. Decision-making details documented in ED Course. ECG/medicine tests: ordered and independent interpretation performed. Decision-making details documented in ED Course. Discussion of management or test interpretation with external provider(s): Ortho, medicine  Risk Prescription drug management. Decision regarding hospitalization.   Iv ns. Continuous pulse ox and cardiac monitoring. Labs ordered/sent. Imaging ordered.   Differential diagnosis includes syncope, hip fx, etc. Dispo decision including potential need for admission considered - will get labs and imaging and reassess.   Reviewed nursing notes and prior charts for additional history. External reports reviewed. Additional history from: EMS, family.   Morphine iv.   Cardiac monitor: sinus rhythm, rate 68.  Labs reviewed/interpreted by me - wbc normal. Hgb 16. Chem normal.   Xrays reviewed/interpreted by me - left fem neck fx.   CT reviewed/interpreted by me - no hem.   Pain persists, left hip.   Morphine iv. Zofran iv.   Pain improved. Bp improved from prior.   Orthopedics consulted  - discussed pt, Dr Romeo Apple,  will see in consult, tentatively plan for OR tomorrow if OR schedule makes it possible.   Hospitalists consulted for admission.             Final Clinical Impression(s) / ED Diagnoses Final diagnoses:  Accidental fall, initial encounter  Syncope, unspecified  syncope type  Closed displaced fracture of left femoral neck (HCC)  Elevated blood pressure reading  Essential hypertension    Rx / DC Orders ED Discharge Orders     None           Cathren Laine, MD 02/24/24 1238

## 2024-02-24 NOTE — ED Notes (Signed)
 Pt returned from radiology.

## 2024-02-24 NOTE — ED Notes (Signed)
 Denton Lank, MD notified re: hypertension

## 2024-02-24 NOTE — H&P (View-Only) (Signed)
 Consult orthopedic service  Dr. Romeo Apple requested by Dr. Odis Hollingshead Tiffany Everett is an 83 y.o. female.  HPI: The patient is 83 years old she is ambulatory she may be starting to undergo some changes in mental status with dementia but was coherent when I met with her.  She says she was going to the bathroom this morning on February 24, 2024 hit something on the floor or missed a step and fell down.  She was eventually found and brought in to the hospital by EMS.  She complained of severe left hip pain and inability to ambulate.  Imaging and radiographic studies confirm that she had a left femoral neck fracture.  She does have some comorbidities listed below which contribute to the decision making process regarding proper and best treatment    Past Medical History:  Diagnosis Date   Colon polyps    Hypertension    Seasonal allergies     Past Surgical History:  Procedure Laterality Date   APPENDECTOMY     CATARACT EXTRACTION W/PHACO Right 09/13/2013   Procedure: CATARACT EXTRACTION PHACO AND INTRAOCULAR LENS PLACEMENT (IOC);  Surgeon: Susa Simmonds, MD;  Location: [facility];  Service: Ophthalmology;  Laterality: Right;  CDE:3.25   CATARACT EXTRACTION W/PHACO Left 11/22/2013   Procedure: CATARACT EXTRACTION PHACO AND INTRAOCULAR LENS PLACEMENT (IOC);  Surgeon: Susa Simmonds, MD;  Location: [facility];  Service: Ophthalmology;  Laterality: Left;  CDE: 3.95   CESAREAN SECTION     x2   COLONOSCOPY     SLF 2010: sigmoid diverticula, otherwise wnl. Repeat 5 yrs.   I & D EXTREMITY Left    lateral wrist   LAPAROTOMY     oopherectomy   ORIF HUMERUS FRACTURE Left     Family History  Problem Relation Age of Onset   Colon cancer Mother 45   Colon cancer Father 78    Social History:  reports that she has never smoked. She has never used smokeless tobacco. She reports that she does not drink alcohol and does not use drugs.  Allergies: No Known Allergies  Medications: I have reviewed the  patient's current medications.  Results for orders placed or performed during the hospital encounter of 02/24/24 (from the past 48 hours)  Basic metabolic panel     Status: Abnormal   Collection Time: 02/24/24  8:18 AM  Result Value Ref Range   Sodium 139 135 - 145 mmol/L   Potassium 3.5 3.5 - 5.1 mmol/L   Chloride 104 98 - 111 mmol/L   CO2 24 22 - 32 mmol/L   Glucose, Bld 119 (H) 70 - 99 mg/dL    Comment: Glucose reference range applies only to samples taken after fasting for at least 8 hours.   BUN 20 8 - 23 mg/dL   Creatinine, Ser 1.61 0.44 - 1.00 mg/dL   Calcium 9.3 8.9 - 09.6 mg/dL   GFR, Estimated >04 >54 mL/min    Comment: (NOTE) Calculated using the CKD-EPI Creatinine Equation (2021)    Anion gap 11 5 - 15    Comment: Performed at [facility], [address], [place], [place] [address]  CBC     Status: Abnormal   Collection Time: 02/24/24  8:18 AM  Result Value Ref Range   WBC 8.8 4.0 - 10.5 K/uL   RBC 5.61 (H) 3.87 - 5.11 MIL/uL   Hemoglobin 16.4 (H) 12.0 - 15.0 g/dL   HCT 91.4 (H) 78.2 - 95.6 %   MCV 89.3 80.0 -  100.0 fL   MCH 29.2 26.0 - 34.0 pg   MCHC 32.7 30.0 - 36.0 g/dL   RDW 14.7 82.9 - 56.2 %   Platelets 164 150 - 400 K/uL   nRBC 0.0 0.0 - 0.2 %    Comment: Performed at [facility], [address], [place], [place] [address]  TSH     Status: None   Collection Time: 02/24/24  2:09 PM  Result Value Ref Range   TSH 1.578 0.350 - 4.500 uIU/mL    Comment: Performed by a 3rd Generation assay with a functional sensitivity of <=0.01 uIU/mL. Performed at [facility], [address], [place], [place] [address]   VITAMIN D 25 Hydroxy (Vit-D Deficiency, Fractures)     Status: None   Collection Time: 02/24/24  2:09 PM  Result Value Ref Range   Vit D, 25-Hydroxy 97.88 30 - 100 ng/mL    Comment: (NOTE) Vitamin D deficiency has been defined by the Institute of Medicine  and an Endocrine Society practice guideline as a level of serum 25-OH  vitamin D less than 20  ng/mL (1,2). The Endocrine Society went on to  further define vitamin D insufficiency as a level between 21 and 29  ng/mL (2).  1. IOM (Institute of Medicine). 2010. Dietary reference intakes for  calcium and D. [place]: [employer]. 2. Holick MF, Binkley [place], Bischoff-Ferrari HA, et al. Evaluation,  treatment, and prevention of vitamin D deficiency: an Endocrine  Society clinical practice guideline, JCEM. 2011 Jul; 96(7): 1911-30.  Performed at [facility], [address], [place], [place] [address]   The radiographic imaging studies below have been reviewed.  I have personally read the hip and pelvic x-rays.  Patient has a completely displaced left femoral neck fracture with no obvious signs of arthritis of the left hip  Report of chest x-ray shows no active disease  Report on the CT scan of the cervical spine shows chronic disease  CT scan of the head shows also chronic changes with no acute fracture or hematoma  DG Chest 1 View Result Date: 02/24/2024 CLINICAL DATA:  Pain after fall EXAM: CHEST  1 VIEW COMPARISON:  Chest x-ray 02/01/2020. FINDINGS: Calcified aorta. Normal cardiopericardial silhouette. No edema. No consolidation, pneumothorax or effusion. Overlapping cardiac leads. Degenerative changes of the spine. Old lower right rib fractures. Hardware along the left humerus at the area edge of the imaging field. IMPRESSION: No acute cardiopulmonary disease. Electronically Signed   By: Karen Kays M.D.   On: 02/24/2024 10:42   DG HIP UNILAT W OR W/O PELVIS 2-3 VIEWS LEFT Result Date: 02/24/2024 CLINICAL DATA:  Fall and left hip pain. EXAM: DG HIP (WITH OR WITHOUT PELVIS) 2-3V LEFT COMPARISON:  Pelvic radiograph dated 02/01/2020. FINDINGS: There is a mildly displaced and impacted fracture of the left femoral neck. No dislocation. The bones are osteopenic. The soft tissues are unremarkable. IMPRESSION: Mildly displaced and impacted fracture of the left  femoral neck. Electronically Signed   By: Elgie Collard M.D.   On: 02/24/2024 10:41   CT Cervical Spine Wo Contrast Result Date: 02/24/2024 CLINICAL DATA:  83 year old female with syncope wall walking to the bathroom. Struck head on floor. EXAM: CT CERVICAL SPINE WITHOUT CONTRAST TECHNIQUE: Multidetector CT imaging of the cervical spine was performed without intravenous contrast. Multiplanar CT image reconstructions were also generated. RADIATION DOSE REDUCTION: This exam was performed according to the departmental dose-optimization program which includes automated exposure control, adjustment of the mA  and/or kV according to patient size and/or use of iterative reconstruction technique. COMPARISON:  Head CT today.  Cervical spine CT 02/01/2020. FINDINGS: Alignment: Stable since 2021. Chronic straightening of cervical lordosis with mild degenerative appearing anterolisthesis at C7-T1. Stable bilateral posterior element alignment. Skull base and vertebrae: Bone mineralization is within normal limits for age. Visualized skull base is intact. No atlanto-occipital dissociation. C1 and C2 appear intact and aligned. No acute osseous abnormality identified. Soft tissues and spinal canal: No prevertebral fluid or swelling. No visible canal hematoma. Bulky calcified atherosclerosis of the left carotid artery. Faintly visible chronic bulky right submandibular gland sialolith. Disc levels: Advanced cervical spine degeneration superimposed on degenerative C2-C3 facet ankylosis on the right. Severe multilevel right cervical facet arthropathy. Bulky disc and endplate degeneration C4-C5 through C6-C7. And facet arthropathy associated with mild anterolisthesis at the cervicothoracic junction. However, fairly capacious CT appearance of the underlying cervical spinal canal. No strong CT evidence of cervical spinal stenosis. Upper chest: Visible upper thoracic levels appear intact. Chronic apical lung scarring appears stable.  IMPRESSION: 1. No acute traumatic injury identified in the cervical spine. 2. Chronically advanced cervical spine degeneration but no strong CT evidence of cervical spinal stenosis. Electronically Signed   By: Odessa Fleming M.D.   On: 02/24/2024 09:29   CT Head Wo Contrast Result Date: 02/24/2024 CLINICAL DATA:  83 year old female with syncope wall walking to the bathroom. Struck head on floor. EXAM: CT HEAD WITHOUT CONTRAST TECHNIQUE: Contiguous axial images were obtained from the base of the skull through the vertex without intravenous contrast. RADIATION DOSE REDUCTION: This exam was performed according to the departmental dose-optimization program which includes automated exposure control, adjustment of the mA and/or kV according to patient size and/or use of iterative reconstruction technique. COMPARISON:  Brain MRI 06/23/2020.  Head CT 02/01/2020. FINDINGS: Brain: Advanced chronic ischemic disease in the bilateral basal ganglia, left thalamus, right MCA anterior or middle division appears stable since 2021. Confluent additional bilateral cerebral white matter hypodensity has not significantly changed. Chronic dystrophic calcifications in the inferior basal ganglia bilaterally. And similar dystrophic appearing calcification in the left cerebellum which is partially stellate and linear (series 5 images 35-37). No regional edema or mass effect. No midline shift, ventriculomegaly, mass effect, evidence of mass lesion, intracranial hemorrhage or evidence of cortically based acute infarction. Vascular: Calcified atherosclerosis at the skull base. No suspicious intracranial vascular hyperdensity. Skull: Intact.  No acute osseous abnormality identified. Sinuses/Orbits: Chronic bilateral paranasal sinus mucoperiosteal thickening does not appear significantly changed. Tympanic cavities and mastoids remain well aerated. Other: No acute orbit or scalp soft tissue injury is identified. IMPRESSION: 1. No acute traumatic injury  identified. 2.  No acute intracranial abnormality. 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 Associated chronic dystrophic calcifications in the bilateral basal ganglia, and similar dystrophic calcifications now in the left deep cerebellar nuclei. 3. Chronic paranasal sinus disease. Electronically Signed   By: Odessa Fleming M.D.   On: 02/24/2024 09:26    Review of Systems Blood pressure (!) 153/99, pulse 78, temperature 98 F (36.7 C), temperature source Oral, resp. rate 16, height 5\' 6"  (1.676 m), weight 71.3 kg, SpO2 98%. Physical Exam Vitals and nursing note reviewed.  Constitutional:      General: She is not in acute distress.    Appearance: Normal appearance. She is normal weight. She is not ill-appearing, toxic-appearing or diaphoretic.  HENT:     Head: Normocephalic and atraumatic.     Right Ear:  External ear normal.     Left Ear: External ear normal.     Nose: Nose normal. No congestion or rhinorrhea.     Mouth/Throat:     Mouth: Mucous membranes are moist.     Pharynx: No oropharyngeal exudate or posterior oropharyngeal erythema.  Eyes:     General: No scleral icterus.       Right eye: No discharge.        Left eye: No discharge.     Extraocular Movements: Extraocular movements intact.     Conjunctiva/sclera: Conjunctivae normal.     Pupils: Pupils are equal, round, and reactive to light.  Cardiovascular:     Rate and Rhythm: Normal rate and regular rhythm.     Heart sounds: Normal heart sounds.  Pulmonary:     Effort: Pulmonary effort is normal. No respiratory distress.     Breath sounds: Normal breath sounds. No stridor.  Chest:     Chest wall: No tenderness.  Abdominal:     General: Abdomen is flat. Bowel sounds are normal. There is no distension.     Palpations: Abdomen is soft. There is no mass.     Tenderness: There is no abdominal tenderness.  Musculoskeletal:     Cervical back: Normal range of motion and neck supple.  No rigidity or tenderness.     Comments: Right upper extremity/left upper extremity/right lower extremity  Skin: Some areas have some bruising nothing appears to be significant  Tenderness none  Range of motion normal functional  Instability: Normal no subluxation revealed.  Muscle tone normal motor function normal  Left lower extremity: Tenderness yes greater trochanter proximal thigh with some swelling skin intact range of motion assessment deferred because of pain instability none on x-ray.  No instability testing in the hip.  Knee and ankle are reduced  Muscle tone normal no tremors.  Lymphadenopathy:     Cervical: No cervical adenopathy.  Skin:    General: Skin is warm and dry.     Capillary Refill: Capillary refill takes less than 2 seconds.  Neurological:     General: No focal deficit present.     Mental Status: She is alert and oriented to person, place, and time.     Cranial Nerves: No cranial nerve deficit.     Sensory: No sensory deficit.     Motor: No weakness.     Gait: Gait abnormal.     Deep Tendon Reflexes: Reflexes normal.  Psychiatric:        Mood and Affect: Mood normal.        Behavior: Behavior normal.        Thought Content: Thought content normal.        Judgment: Judgment normal.     Assessment/Plan: I discussed the situation with the patient and her son and advised bipolar hip replacement.  This is associated with some risks which include but not limited to leg length discrepancy persistent limp thigh pain blood clot infection  Several interventions will be done to mitigate these risks but it is not possible to completely eliminate them  Diagnosis left femoral neck fracture displaced  Plan left bipolar hip replacement  Fuller Canada 02/24/2024, 6:51 PM

## 2024-02-24 NOTE — TOC Initial Note (Signed)
 Transition of Care [facility]) - Initial/Assessment Note    Patient Details  Name: Tiffany Everett MRN: [id number] Date of Birth: 01/03/1941  [facility] CM/SW Contact:    Barron Alvine, RN Phone Number: 02/24/2024, 7:32 PM  Clinical Narrative:                 Pt from home c/2 adult sons. Pt has been evaluated by Dr. Romeo Apple and is a candidate for surgery. She is expecting SNF for inpt PT at dc. TOC to follow.   Expected Discharge Plan: Skilled Nursing Facility Barriers to Discharge: Continued Medical Work up   Patient Goals and CMS Choice Patient states their goals for this hospitalization and ongoing recovery are:: "Fix this hip"          Expected Discharge Plan and Services       Living arrangements for the past 2 months: Single Family Home                                      Prior Living Arrangements/Services Living arrangements for the past 2 months: Single Family Home Lives with:: Adult Children Patient language and need for interpreter reviewed:: Yes Do you feel safe going back to the place where you live?: Yes      Need for Family Participation in Patient Care: No (Comment) Care giver support system in place?: Yes (comment)   Criminal Activity/Legal Involvement Pertinent to Current Situation/Hospitalization: No - Comment as needed  Activities of Daily Living   ADL Screening (condition at time of admission) Independently performs ADLs?: Yes (appropriate for developmental age) Is the patient deaf or have difficulty hearing?: No Does the patient have difficulty seeing, even when wearing glasses/contacts?: No Does the patient have difficulty concentrating, remembering, or making decisions?: No  Permission Sought/Granted                  Emotional Assessment     Affect (typically observed): Pleasant Orientation: : Oriented to Self, Oriented to Place, Oriented to  Time, Oriented to Situation   Psych Involvement: No  (comment)  Admission diagnosis:  Elevated blood pressure reading [R03.0] Closed left hip fracture (HCC) [S72.002A] Essential hypertension [I10] Accidental fall, initial encounter [B14.XXXA] Closed displaced fracture of left femoral neck (HCC) [S72.002A] Syncope, unspecified syncope type [R55] Patient Active Problem List   Diagnosis Date Noted   Closed left hip fracture (HCC) 02/24/2024   Borderline prolonged QT interval 02/02/2020   Concussion with brief loss of consciousness 02/01/2020   Polycythemia 02/01/2020   Elevated CK 02/01/2020   Hypertension    Hypothermia 11/25/2017   N&V (nausea and vomiting) 11/25/2017   History of colonic polyps 04/24/2015   PCP:  Roe Rutherford, NP Pharmacy:   [facility] [phone] - [place], [place] - [address] AT [facility] OF [address] & [address] [place] [address] Phone: [phone] Fax: [phone]     Social Drivers of Health (SDOH) Social History: SDOH Screenings   Food Insecurity: No Food Insecurity (02/24/2024)  Housing: Low Risk  (02/24/2024)  Transportation Needs: No Transportation Needs (02/24/2024)  Utilities: Not At Risk (02/24/2024)  Financial Resource Strain: Low Risk  (10/31/2023)   Received from [facility]  Physical Activity: Unknown (10/31/2023)   Received from [facility]  Social Connections: Moderately Integrated (02/24/2024)  Stress: No Stress Concern Present (10/31/2023)   Received from [facility]  Tobacco Use:  Low Risk  (02/24/2024)   SDOH Interventions:     Readmission Risk Interventions     No data to display

## 2024-02-24 NOTE — H&P (Signed)
 History and Physical    Patient: Tiffany Everett NWG:[id number] DOB: 1941/08/29 DOA: 02/24/2024 DOS: the patient was seen and examined on 02/24/2024 PCP: Roe Rutherford, NP  Patient coming from: Home  Chief Complaint:  Chief Complaint  Patient presents with   Fall   HPI: Tiffany Everett is a 83 y.o. female with medical history significant of hypertension, depression/anxiety and history of vitamin D deficiency; who presented to the hospital secondary to hip pain and inability to bear weight after fall this morning.  Patient expressed waking up to go to the bathroom when she essentially passed out and ended up falling on her left side.  She experienced significant pain and discomfort from his left hip and was unable to bear weight.   There has not been any fever, dysuria, hematuria, focal weaknesses prior to this event, sick contact, nausea, vomiting, diarrhea, chest pain or shortness of breath.  Workup in the ED demonstrating left neck femur fracture mildly displaced; CT scan of the head without acute intracranial normalities and no cardiopulmonary process appreciated.  Blood work for the most part stable.  Orthopedic service has been consulted and [facility] contacted to place patient in the hospital for further evaluation and management.  Review of Systems: As mentioned in the history of present illness. All other systems reviewed and are negative. Past Medical History:  Diagnosis Date   Colon polyps    Hypertension    Seasonal allergies    Past Surgical History:  Procedure Laterality Date   APPENDECTOMY     CATARACT EXTRACTION W/PHACO Right 09/13/2013   Procedure: CATARACT EXTRACTION PHACO AND INTRAOCULAR LENS PLACEMENT (IOC);  Surgeon: Susa Simmonds, MD;  Location: [facility];  Service: Ophthalmology;  Laterality: Right;  CDE:3.25   CATARACT EXTRACTION W/PHACO Left 11/22/2013   Procedure: CATARACT EXTRACTION PHACO AND INTRAOCULAR LENS PLACEMENT (IOC);  Surgeon: Susa Simmonds, MD;   Location: [facility];  Service: Ophthalmology;  Laterality: Left;  CDE: 3.95   CESAREAN SECTION     x2   COLONOSCOPY     SLF 2010: sigmoid diverticula, otherwise wnl. Repeat 5 yrs.   I & D EXTREMITY Left    lateral wrist   LAPAROTOMY     oopherectomy   ORIF HUMERUS FRACTURE Left    Social History:  reports that she has never smoked. She has never used smokeless tobacco. She reports that she does not drink alcohol and does not use drugs.  No Known Allergies  Family History  Problem Relation Age of Onset   Colon cancer Mother 7   Colon cancer Father 45    Prior to Admission medications   Medication Sig Start Date End Date Taking? Authorizing Provider  aspirin EC 81 MG tablet Take 1 tablet (81 mg total) by mouth daily with breakfast. 02/03/20  Yes Emokpae, Courage, MD  metoprolol succinate (TOPROL-XL) 25 MG 24 hr tablet Take 25 mg by mouth daily. 06/16/20  Yes [provider]  simvastatin (ZOCOR) 10 MG tablet Take 1 tablet by mouth daily. 03/31/21  Yes [provider]  venlafaxine XR (EFFEXOR-XR) 37.5 MG 24 hr capsule Take 37.5 mg by mouth daily. 05/22/21  Yes [provider]  Vitamin D, Ergocalciferol, (DRISDOL) 1.25 MG (50000 UNIT) CAPS capsule Take 50,000 Units by mouth every 7 (seven) days.   Yes [provider]    Physical Exam: Vitals:   02/24/24 1500 02/24/24 1600 02/24/24 1708 02/24/24 1834  BP: (!) 168/72 (!) 159/84 (!) 153/99 (!) 153/99  Pulse: 73 77  78 78  Resp: 17 11 16 16   Temp:   98 F (36.7 C)   TempSrc:   Oral   SpO2: 94% 98% 98%   Weight:    71.3 kg  Height:    5\' 6"  (1.676 m)   General exam: In no acute distress as long as she doesn't attempt to move. Respiratory system: Good saturation on room air.  No using accessory muscle. Cardiovascular system:RRR. No rubs or gallops; no JVD. Gastrointestinal system: Abdomen is nondistended, soft and nontender. No organomegaly or masses felt. Normal bowel sounds heard. Central nervous  system: No focal neurological deficits. Extremities: No cyanosis or clubbing.  Left lower extremity with decreased range of motion secondary to pain.  Slightly shortened on exam. Skin: No petechiae. Psychiatry: Mood & affect appropriate.    Data Reviewed: CBC: White blood cells 8.8, hemoglobin 16.4 and platelet count 164K Basic metabolic panel: Sodium 139, potassium 3.5, chloride 104, bicarb 24, BUN 20, creatinine 0.83 GFR >60 Vitamin D level: 97.88  Assessment and Plan: 1-left hip fracture -Mildly displaced left femoral neck fracture -Follow hip fracture protocol -Continue as needed analgesics and supportive care -Orthopedic service has been consulted with plan for left bipolar hip replacement.  2-hypertension -Resume home antihypertensive agents and follow vital signs -Patient's hip fracture/pain contributing to elevated blood pressure. -No chest pain or any other complaints currently.  3-hyperlipidemia -Continue Zocor  4-history of depression -Continue Effexor    Advance Care Planning:   Code Status: Full Code   Consults: Orthopedic service  Family Communication: No family at bedside  Severity of Illness: The appropriate patient status for this patient is INPATIENT. Inpatient status is judged to be reasonable and necessary in order to provide the required intensity of service to ensure the patient's safety. The patient's presenting symptoms, physical exam findings, and initial radiographic and laboratory data in the context of their chronic comorbidities is felt to place them at high risk for further clinical deterioration. Furthermore, it is not anticipated that the patient will be medically stable for discharge from the hospital within 2 midnights of admission.   * I certify that at the point of admission it is my clinical judgment that the patient will require inpatient hospital care spanning beyond 2 midnights from the point of admission due to high intensity of service,  high risk for further deterioration and high frequency of surveillance required.*  Author: Vassie Loll, MD 02/24/2024 7:06 PM  For on call review [email or web address].

## 2024-02-24 NOTE — ED Triage Notes (Signed)
 Pt in from [facility], per report pt woke up and had a syncopal episode while walking to the bathroom, pt hit head on the carpeted floor and she woke up when she landed, pt recalls events, pt c/o L hip pain with no obvious deformity, rotation or shortening to the L leg,pt does not take blood thinners, A&O x4

## 2024-02-25 ENCOUNTER — Inpatient Hospital Stay (HOSPITAL_COMMUNITY)

## 2024-02-25 ENCOUNTER — Encounter (HOSPITAL_COMMUNITY)
Admission: EM | Disposition: A | Discharge status: Skilled Nursing Facility | Payer: Self-pay | Source: Home / Self Care | Attending: Internal Medicine

## 2024-02-25 ENCOUNTER — Encounter (HOSPITAL_COMMUNITY): Payer: Self-pay | Admitting: Internal Medicine

## 2024-02-25 ENCOUNTER — Other Ambulatory Visit: Payer: Self-pay

## 2024-02-25 DIAGNOSIS — S72002A Fracture of unspecified part of neck of left femur, initial encounter for closed fracture: Secondary | ICD-10-CM | POA: Diagnosis not present

## 2024-02-25 DIAGNOSIS — R030 Elevated blood-pressure reading, without diagnosis of hypertension: Secondary | ICD-10-CM | POA: Diagnosis not present

## 2024-02-25 HISTORY — PX: HIP ARTHROPLASTY: SHX981

## 2024-02-25 LAB — SURGICAL PCR SCREEN
MRSA, PCR: NEGATIVE
Staphylococcus aureus: NEGATIVE

## 2024-02-25 LAB — CBC
HCT: 45.8 % (ref 36.0–46.0)
Hemoglobin: 15 g/dL (ref 12.0–15.0)
MCH: 29 pg (ref 26.0–34.0)
MCHC: 32.8 g/dL (ref 30.0–36.0)
MCV: 88.6 fL (ref 80.0–100.0)
Platelets: 157 10*3/uL (ref 150–400)
RBC: 5.17 MIL/uL — ABNORMAL HIGH (ref 3.87–5.11)
RDW: 13.8 % (ref 11.5–15.5)
WBC: 8.5 10*3/uL (ref 4.0–10.5)
nRBC: 0 % (ref 0.0–0.2)

## 2024-02-25 LAB — BASIC METABOLIC PANEL
Anion gap: 8 (ref 5–15)
BUN: 19 mg/dL (ref 8–23)
CO2: 24 mmol/L (ref 22–32)
Calcium: 8.8 mg/dL — ABNORMAL LOW (ref 8.9–10.3)
Chloride: 106 mmol/L (ref 98–111)
Creatinine, Ser: 0.84 mg/dL (ref 0.44–1.00)
GFR, Estimated: >60 mL/min (ref >60)
Glucose, Bld: 117 mg/dL — ABNORMAL HIGH (ref 70–99)
Potassium: 3.8 mmol/L (ref 3.5–5.1)
Sodium: 138 mmol/L (ref 135–145)

## 2024-02-25 LAB — MAGNESIUM: Magnesium: 2.2 mg/dL (ref 1.7–2.4)

## 2024-02-25 SURGERY — HEMIARTHROPLASTY (BIPOLAR) HIP, POSTERIOR APPROACH FOR FRACTURE
Anesthesia: General | Site: Hip | Laterality: Left

## 2024-02-25 MED ORDER — MENTHOL 3 MG MT LOZG: 1.0000 | LOZENGE | OROMUCOSAL | Status: DC | PRN

## 2024-02-25 MED ORDER — HYDROCODONE-ACETAMINOPHEN 7.5-325 MG PO TABS
1.0000 | ORAL_TABLET | ORAL | Status: DC | PRN
Start: 2024-02-25 — End: 2024-02-27

## 2024-02-25 MED ORDER — METOCLOPRAMIDE HCL 5 MG PO TABS: 5.0000 mg | ORAL_TABLET | Freq: Three times a day (TID) | ORAL | Status: DC | PRN

## 2024-02-25 MED ORDER — ACETAMINOPHEN 325 MG PO TABS: 325.0000 mg | ORAL_TABLET | Freq: Four times a day (QID) | ORAL | Status: DC | PRN

## 2024-02-25 MED ORDER — CEFAZOLIN SODIUM-DEXTROSE 2-4 GM/100ML-% IV SOLN
2.0000 g | Freq: Four times a day (QID) | INTRAVENOUS | Status: AC
  Administered 2024-02-26: 2 g via INTRAVENOUS
  Filled 2024-02-25 (×2): qty 100

## 2024-02-25 MED ORDER — CHLORHEXIDINE GLUCONATE 0.12 % MT SOLN
15.0000 mL | Freq: Once | OROMUCOSAL | Status: AC
  Administered 2024-02-25: 15 mL via OROMUCOSAL

## 2024-02-25 MED ORDER — CEFAZOLIN SODIUM-DEXTROSE 2-4 GM/100ML-% IV SOLN: 2.0000 g | INTRAVENOUS | Status: DC

## 2024-02-25 MED ORDER — ONDANSETRON HCL 4 MG/2ML IJ SOLN
INTRAMUSCULAR | Status: DC | PRN
  Administered 2024-02-25: 4 mg via INTRAVENOUS

## 2024-02-25 MED ORDER — PHENOL 1.4 % MT LIQD: 1.0000 | OROMUCOSAL | Status: DC | PRN

## 2024-02-25 MED ORDER — HYDROMORPHONE HCL 1 MG/ML IJ SOLN: 0.2500 mg | INTRAMUSCULAR | Status: DC | PRN

## 2024-02-25 MED ORDER — ACETAMINOPHEN 160 MG/5ML PO SOLN: 960.0000 mg | Freq: Once | ORAL | Status: AC

## 2024-02-25 MED ORDER — FENTANYL CITRATE (PF) 100 MCG/2ML IJ SOLN
INTRAMUSCULAR | Status: AC
  Filled 2024-02-25: qty 2

## 2024-02-25 MED ORDER — PROPOFOL 10 MG/ML IV BOLUS
INTRAVENOUS | Status: DC | PRN
  Administered 2024-02-25: 100 mg via INTRAVENOUS
  Administered 2024-02-25 (×2): 50 mg via INTRAVENOUS

## 2024-02-25 MED ORDER — CHLORHEXIDINE GLUCONATE 4 % EX SOLN
60.0000 mL | Freq: Once | CUTANEOUS | Status: AC
  Administered 2024-02-25: 4 via TOPICAL
  Filled 2024-02-25: qty 60

## 2024-02-25 MED ORDER — OXYCODONE HCL 5 MG PO TABS: 5.0000 mg | ORAL_TABLET | Freq: Once | ORAL | Status: DC | PRN

## 2024-02-25 MED ORDER — BUPIVACAINE-EPINEPHRINE (PF) 0.5% -1:200000 IJ SOLN
INTRAMUSCULAR | Status: DC | PRN
  Administered 2024-02-25: 30 mL via PERINEURAL

## 2024-02-25 MED ORDER — ORAL CARE MOUTH RINSE: 15.0000 mL | Freq: Once | OROMUCOSAL | Status: AC

## 2024-02-25 MED ORDER — TRANEXAMIC ACID-NACL 1000-0.7 MG/100ML-% IV SOLN
1000.0000 mg | Freq: Once | INTRAVENOUS | Status: AC
  Administered 2024-02-25: 1000 mg via INTRAVENOUS
  Filled 2024-02-25: qty 100

## 2024-02-25 MED ORDER — LACTATED RINGERS IV SOLN: INTRAVENOUS | Status: DC

## 2024-02-25 MED ORDER — PHENYLEPHRINE HCL-NACL 20-0.9 MG/250ML-% IV SOLN
INTRAVENOUS | Status: DC | PRN
  Administered 2024-02-25: 25 ug/min via INTRAVENOUS

## 2024-02-25 MED ORDER — ROCURONIUM BROMIDE 100 MG/10ML IV SOLN
INTRAVENOUS | Status: DC | PRN
  Administered 2024-02-25: 50 mg via INTRAVENOUS

## 2024-02-25 MED ORDER — CEFAZOLIN SODIUM-DEXTROSE 2-3 GM-%(50ML) IV SOLR
INTRAVENOUS | Status: DC | PRN
Start: 2024-02-25 — End: 2024-02-25
  Administered 2024-02-25: 2 g via INTRAVENOUS

## 2024-02-25 MED ORDER — PHENYLEPHRINE 80 MCG/ML (10ML) SYRINGE FOR IV PUSH (FOR BLOOD PRESSURE SUPPORT)
PREFILLED_SYRINGE | INTRAVENOUS | Status: DC | PRN
Start: 2024-02-25 — End: 2024-02-25
  Administered 2024-02-25: 80 ug via INTRAVENOUS

## 2024-02-25 MED ORDER — 0.9 % SODIUM CHLORIDE (POUR BTL) OPTIME
TOPICAL | Status: DC | PRN
Start: 2024-02-25 — End: 2024-02-25
  Administered 2024-02-25: 1000 mL

## 2024-02-25 MED ORDER — ACETAMINOPHEN 500 MG PO TABS
500.0000 mg | ORAL_TABLET | Freq: Four times a day (QID) | ORAL | Status: AC
  Administered 2024-02-25 – 2024-02-26 (×4): 500 mg via ORAL
  Filled 2024-02-25 (×4): qty 1

## 2024-02-25 MED ORDER — MIDAZOLAM HCL 2 MG/2ML IJ SOLN
INTRAMUSCULAR | Status: AC
Start: 2024-02-25 — End: ?
  Filled 2024-02-25: qty 2

## 2024-02-25 MED ORDER — HYDROCODONE-ACETAMINOPHEN 5-325 MG PO TABS
1.0000 | ORAL_TABLET | ORAL | Status: DC | PRN
Start: 2024-02-25 — End: 2024-02-27

## 2024-02-25 MED ORDER — CELECOXIB 100 MG PO CAPS
200.0000 mg | ORAL_CAPSULE | Freq: Two times a day (BID) | ORAL | Status: DC
  Administered 2024-02-25 – 2024-02-27 (×4): 200 mg via ORAL
  Filled 2024-02-25 (×4): qty 2

## 2024-02-25 MED ORDER — CEFAZOLIN SODIUM-DEXTROSE 2-4 GM/100ML-% IV SOLN
INTRAVENOUS | Status: AC
Start: 2024-02-25 — End: 2024-02-25
  Administered 2024-02-25: 2 g via INTRAVENOUS
  Filled 2024-02-25: qty 100

## 2024-02-25 MED ORDER — OXYCODONE HCL 5 MG/5ML PO SOLN: 5.0000 mg | Freq: Once | ORAL | Status: DC | PRN

## 2024-02-25 MED ORDER — TRANEXAMIC ACID-NACL 1000-0.7 MG/100ML-% IV SOLN
INTRAVENOUS | Status: AC
  Filled 2024-02-25: qty 100

## 2024-02-25 MED ORDER — DOCUSATE SODIUM 100 MG PO CAPS
100.0000 mg | ORAL_CAPSULE | Freq: Two times a day (BID) | ORAL | Status: DC
  Administered 2024-02-25 – 2024-02-27 (×4): 100 mg via ORAL
  Filled 2024-02-25 (×8): qty 1

## 2024-02-25 MED ORDER — SCOPOLAMINE 1 MG/3DAYS TD PT72
MEDICATED_PATCH | TRANSDERMAL | Status: AC
  Administered 2024-02-25: 1.5 mg
  Filled 2024-02-25: qty 1

## 2024-02-25 MED ORDER — DEXAMETHASONE SODIUM PHOSPHATE 4 MG/ML IJ SOLN
INTRAMUSCULAR | Status: AC
  Administered 2024-02-25: 8 mg
  Filled 2024-02-25: qty 2

## 2024-02-25 MED ORDER — DEXAMETHASONE SODIUM PHOSPHATE 10 MG/ML IJ SOLN: 8.0000 mg | Freq: Once | INTRAMUSCULAR | Status: AC

## 2024-02-25 MED ORDER — TRANEXAMIC ACID-NACL 1000-0.7 MG/100ML-% IV SOLN
1000.0000 mg | INTRAVENOUS | Status: AC
  Administered 2024-02-25: 1000 mg via INTRAVENOUS

## 2024-02-25 MED ORDER — SODIUM CHLORIDE 0.9 % IV SOLN: INTRAVENOUS | Status: AC

## 2024-02-25 MED ORDER — KETOROLAC TROMETHAMINE 15 MG/ML IJ SOLN
7.5000 mg | Freq: Four times a day (QID) | INTRAMUSCULAR | Status: AC
  Administered 2024-02-25 – 2024-02-26 (×4): 7.5 mg via INTRAVENOUS
  Filled 2024-02-25 (×4): qty 1

## 2024-02-25 MED ORDER — SUGAMMADEX SODIUM 200 MG/2ML IV SOLN
INTRAVENOUS | Status: DC | PRN
  Administered 2024-02-25: 140 mg via INTRAVENOUS

## 2024-02-25 MED ORDER — FENTANYL CITRATE (PF) 100 MCG/2ML IJ SOLN
INTRAMUSCULAR | Status: DC | PRN
  Administered 2024-02-25 (×2): 50 ug via INTRAVENOUS
  Administered 2024-02-25: 100 ug via INTRAVENOUS

## 2024-02-25 MED ORDER — MORPHINE SULFATE (PF) 2 MG/ML IV SOLN: 0.5000 mg | INTRAVENOUS | Status: DC | PRN

## 2024-02-25 MED ORDER — ACETAMINOPHEN 500 MG PO TABS
1000.0000 mg | ORAL_TABLET | Freq: Once | ORAL | Status: AC
  Administered 2024-02-25: 1000 mg via ORAL
  Filled 2024-02-25: qty 2

## 2024-02-25 MED ORDER — SODIUM CHLORIDE 0.9 % IR SOLN
Status: DC | PRN
  Administered 2024-02-25: 3000 mL

## 2024-02-25 MED ORDER — BUPIVACAINE-EPINEPHRINE (PF) 0.5% -1:200000 IJ SOLN
INTRAMUSCULAR | Status: AC
  Filled 2024-02-25: qty 30

## 2024-02-25 MED ORDER — HYDROMORPHONE HCL 1 MG/ML IJ SOLN
INTRAMUSCULAR | Status: AC
Start: 2024-02-25 — End: ?
  Filled 2024-02-25: qty 0.5

## 2024-02-25 MED ORDER — ENOXAPARIN SODIUM 30 MG/0.3ML IJ SOSY
30.0000 mg | PREFILLED_SYRINGE | INTRAMUSCULAR | Status: DC
  Filled 2024-02-25 (×2): qty 0.3

## 2024-02-25 MED ORDER — POVIDONE-IODINE 10 % EX SWAB
2.0000 | Freq: Once | CUTANEOUS | Status: AC
  Administered 2024-02-25: 2 via TOPICAL

## 2024-02-25 MED ORDER — METOCLOPRAMIDE HCL 5 MG/ML IJ SOLN: 5.0000 mg | Freq: Three times a day (TID) | INTRAMUSCULAR | Status: DC | PRN

## 2024-02-25 SURGICAL SUPPLY — 50 items
BALL HIP ARTICU 28 +5 (Hips) IMPLANT
BIPOLAR DEPUY 49 (Hips) ×1 IMPLANT
BIT DRILL 2.8X128 (BIT) ×1 IMPLANT
BLADE SAGITTAL 25.0X1.27X90 (BLADE) ×1 IMPLANT
CHLORAPREP W/TINT 26 (MISCELLANEOUS) ×1 IMPLANT
COUNTER NDL MAGNETIC 40 RED (SET/KITS/TRAYS/PACK) ×1 IMPLANT
COUNTER NEEDLE MAGNETIC 40 RED (SET/KITS/TRAYS/PACK) ×1 IMPLANT
COVER LIGHT HANDLE STERIS (MISCELLANEOUS) ×2 IMPLANT
DRAPE HIP W/POCKET STRL (MISCELLANEOUS) ×1 IMPLANT
DRAPE U-SHAPE 47X51 STRL (DRAPES) ×1 IMPLANT
DRSG MEPILEX POST OP 4X12 (GAUZE/BANDAGES/DRESSINGS) IMPLANT
DRSG MEPILEX SACRM 8.7X9.8 (GAUZE/BANDAGES/DRESSINGS) ×1 IMPLANT
ELECT REM PT RETURN 9FT ADLT (ELECTROSURGICAL) ×1 IMPLANT
ELECTRODE REM PT RTRN 9FT ADLT (ELECTROSURGICAL) ×1 IMPLANT
GLOVE BIOGEL PI IND STRL 7.0 (GLOVE) ×2 IMPLANT
GLOVE SS N UNI LF 8.5 STRL (GLOVE) ×1 IMPLANT
GLOVE SURG POLYISO LF SZ8 (GLOVE) ×2 IMPLANT
GOWN STRL REUS W/TWL LRG LVL3 (GOWN DISPOSABLE) ×2 IMPLANT
GOWN STRL REUS W/TWL XL LVL3 (GOWN DISPOSABLE) ×1 IMPLANT
HEAD BIPOLAR DEPUY 49 (Hips) IMPLANT
HIP BALL ARTICU 28 +5 (Hips) ×1 IMPLANT
INST SET MAJOR BONE (KITS) ×1 IMPLANT
KIT TURNOVER KIT A (KITS) ×1 IMPLANT
MANIFOLD NEPTUNE II (INSTRUMENTS) ×1 IMPLANT
MARKER SKIN DUAL TIP RULER LAB (MISCELLANEOUS) ×1 IMPLANT
NDL HYPO 18GX1.5 BLUNT FILL (NEEDLE) ×1 IMPLANT
NDL HYPO 21X1.5 SAFETY (NEEDLE) ×1 IMPLANT
NEEDLE HYPO 18GX1.5 BLUNT FILL (NEEDLE) ×1 IMPLANT
NEEDLE HYPO 21X1.5 SAFETY (NEEDLE) ×1 IMPLANT
NS IRRIG 1000ML POUR BTL (IV SOLUTION) ×1 IMPLANT
PACK TOTAL JOINT (CUSTOM PROCEDURE TRAY) ×1 IMPLANT
PAD ARMBOARD POSITIONER FOAM (MISCELLANEOUS) ×1 IMPLANT
PIN STMN SNGL STERILE 9X3.6MM (PIN) ×2 IMPLANT
POSITIONER HEAD 8X9X4 ADT (SOFTGOODS) ×1 IMPLANT
SET BASIN LINEN APH (SET/KITS/TRAYS/PACK) ×1 IMPLANT
SET HNDPC FAN SPRY TIP SCT (DISPOSABLE) ×1 IMPLANT
SPONGE T-LAP 18X18 ~~LOC~~+RFID (SPONGE) IMPLANT
STAPLER SKIN PROX WIDE 3.9 (STAPLE) ×1 IMPLANT
STEM FEMORAL SZ8 STD ACTIS (Stem) IMPLANT
STRIP CLOSURE SKIN 1/2X4 (GAUZE/BANDAGES/DRESSINGS) IMPLANT
SUT BRALON NAB BRD #1 30IN (SUTURE) ×2 IMPLANT
SUT ETHIBOND 5 LR DA (SUTURE) ×2 IMPLANT
SUT MNCRL 0 VIOLET CTX 36 (SUTURE) ×1 IMPLANT
SUT MON AB 0 CT1 (SUTURE) ×1 IMPLANT
SUT VIC AB 1 CT1 27XBRD ANTBC (SUTURE) ×4 IMPLANT
SYR 30ML LL (SYRINGE) ×1 IMPLANT
SYR BULB IRRIG 60ML STRL (SYRINGE) ×1 IMPLANT
TRAY FOLEY MTR SLVR 16FR STAT (SET/KITS/TRAYS/PACK) ×1 IMPLANT
WATER STERILE IRR 1000ML POUR (IV SOLUTION) ×2 IMPLANT
YANKAUER SUCT 12FT TUBE ARGYLE (SUCTIONS) ×1 IMPLANT

## 2024-02-25 NOTE — Progress Notes (Signed)
  Progress Note   Patient: Tiffany Everett:[id number] DOB: [birth year] DOA: 02/24/2024     1 DOS: the patient was seen and examined on 02/25/2024   Brief hospital admission narrative: Tiffany Everett is a 83 y.o. female with medical history significant of hypertension, depression/anxiety and history of vitamin D deficiency; who presented to the hospital secondary to hip pain and inability to bear weight after fall this morning.  Patient expressed waking up to go to the bathroom when she essentially passed out and ended up falling on her left side.  She experienced significant pain and discomfort from his left hip and was unable to bear weight.   There has not been any fever, dysuria, hematuria, focal weaknesses prior to this event, sick contact, nausea, vomiting, diarrhea, chest pain or shortness of breath.   Workup in the ED demonstrating left neck femur fracture mildly displaced; CT scan of the head without acute intracranial normalities and no cardiopulmonary process appreciated.  Blood work for the most part stable.  Orthopedic service has been consulted and TRH contacted to place patient in the hospital for further evaluation and management.  Assessment and Plan: 1-left hip fracture -Continue as needed analgesia -Planning for surgical repair later today -Will follow postoperative recommendations by orthopedic service -Continue supportive care and follow clinical response.  2-hyperlipidemia -Continue statin.  3-history of depression: -Continue Effexor  4-hypertension -Continue home antihypertensive agents and follow vital signs fluctuation -Will adjust medication as needed.  Subjective:  Afebrile, no chest pain, no nausea vomiting.  Reports left hip pain with movement.  Physical Exam: Vitals:   02/25/24 0434 02/25/24 1141 02/25/24 1500 02/25/24 1515  BP: (!) 163/76 (!) 168/93 (!) 174/72 (!) 145/80  Pulse: 82 84 83 69  Resp:  16 11 (!) 9  Temp: 98.5 F (36.9 C) 98.2 F  (36.8 C) 98 F (36.7 C)   TempSrc: Oral Oral    SpO2: 97% 93% 98% 99%  Weight:  71.3 kg    Height:  5\' 6"  (1.676 m)     General exam: Alert, awake, oriented x 3; no overnight events.  Currently hungry as she is n.p.o. for surgery. Respiratory system: 2 L nasal: Supplementation in place at time of evaluation; patient denies shortness of breath, no using accessory muscle.  Positive rhonchi.  No wheezing Cardiovascular system:RRR. No rubs or gallops; no JVD. Gastrointestinal system: Abdomen is nondistended, soft and nontender. No organomegaly or masses felt. Normal bowel sounds heard. Central nervous system:No focal neurological deficits. Extremities: No cyanosis or clubbing. Skin: No petechiae. Psychiatry: Judgement and insight appear normal. Mood & affect appropriate.   Data Reviewed: Basic metabolic panel: Sodium 138, potassium 3.8, chloride 106, bicarb 24 BUN 19 creatinine 0.84 CBC: WBCs 8.5, hemoglobin 15.0, platelet count 1.7K. Magnesium: 2.2  Family Communication: Son updated at bedside.  Disposition: Status is: Inpatient Remains inpatient appropriate because: Continue supportive care, IV analgesics and follow postoperative recommendations after surgical repair of left hip.   Planned Discharge Destination: To be determined.  Time spent: 50 minutes  Author: Vassie Loll, MD 02/25/2024 3:20 PM  For on call review [email or web address].

## 2024-02-25 NOTE — Anesthesia Procedure Notes (Signed)
 Procedure Name: Intubation Date/Time: 02/25/2024 1:05 PM  Performed by: Shanon Payor, CRNAPre-anesthesia Checklist: Patient identified, Emergency Drugs available, Suction available, Patient being monitored and Timeout performed Patient Re-evaluated:Patient Re-evaluated prior to induction Oxygen Delivery Method: Circle system utilized Preoxygenation: Pre-oxygenation with 100% oxygen Induction Type: IV induction Ventilation: Mask ventilation without difficulty Laryngoscope Size: Mac and 3 Grade View: Grade II Tube type: Oral Tube size: 7.0 mm Number of attempts: 1 Airway Equipment and Method: Stylet Placement Confirmation: ETT inserted through vocal cords under direct vision, positive ETCO2 and breath sounds checked- equal and bilateral Secured at: 23 cm Tube secured with: Tape Dental Injury: Teeth and Oropharynx as per pre-operative assessment

## 2024-02-25 NOTE — Interval H&P Note (Signed)
 History and Physical Interval Note:  02/25/2024 12:45 PM  Tiffany Everett  has presented today for surgery, with the diagnosis of left femoral neck hip fracture.  The various methods of treatment have been discussed with the patient and family. After consideration of risks, benefits and other options for treatment, the patient has consented to  Procedure(s): HEMIARTHROPLASTY (BIPOLAR) HIP,  (Left) as a surgical intervention.  The patient's history has been reviewed, patient examined, no change in status, stable for surgery.  I have reviewed the patient's chart and labs.  Questions were answered to the patient's satisfaction.     Fuller Canada

## 2024-02-25 NOTE — TOC Progression Note (Signed)
 Transition of Care [facility]) - Progression Note    Patient Details  Name: Tiffany Everett MRN: [id number] Date of Birth: 07/24/1941  [facility]) CM/SW Contact  Leitha Bleak, RN Phone Number: 02/25/2024, 12:56 PM  Clinical Narrative:   Patient in surgery, PT eval pending. FL2 started.     Expected Discharge Plan: Skilled Nursing Facility Barriers to Discharge: Continued Medical Work up  Expected Discharge Plan and Services       Living arrangements for the past 2 months: Single Family Home                                       Social Determinants of Health (SDOH) Interventions SDOH Screenings   Food Insecurity: No Food Insecurity (02/24/2024)  Housing: Low Risk  (02/24/2024)  Transportation Needs: No Transportation Needs (02/24/2024)  Utilities: Not At Risk (02/24/2024)  Financial Resource Strain: Low Risk  (10/31/2023)   Received from [facility]  Physical Activity: Unknown (10/31/2023)   Received from [facility]  Social Connections: Moderately Integrated (02/24/2024)  Stress: No Stress Concern Present (10/31/2023)   Received from [facility]  Tobacco Use: Low Risk  (02/25/2024)    Readmission Risk Interventions     No data to display

## 2024-02-25 NOTE — Anesthesia Preprocedure Evaluation (Signed)
 Anesthesia Evaluation  Patient identified by MRN, date of birth, ID band Patient awake    Reviewed: Allergy & Precautions, H&P , NPO status , Patient's Chart, lab work & pertinent test results  History of Anesthesia Complications Negative for: history of anesthetic complications  Airway Mallampati: II  TM Distance: >3 FB   Mouth opening: Limited Mouth Opening Comment: Hx TMJ with limited opening Dental no notable dental hx. (+) Teeth Intact, Dental Advisory Given   Pulmonary neg pulmonary ROS   breath sounds clear to auscultation       Cardiovascular hypertension, Normal cardiovascular exam Rhythm:Regular Rate:Normal     Neuro/Psych negative neurological ROS  negative psych ROS   GI/Hepatic negative GI ROS, Neg liver ROS,,,  Endo/Other  negative endocrine ROS    Renal/GU negative Renal ROS     Musculoskeletal negative musculoskeletal ROS (+)    Abdominal   Peds  Hematology negative hematology ROS (+)   Anesthesia Other Findings   Reproductive/Obstetrics negative OB ROS                             Anesthesia Physical Anesthesia Plan  ASA: 2  Anesthesia Plan: General   Post-op Pain Management: Dilaudid IV   Induction: Intravenous  PONV Risk Score and Plan: 3 and Ondansetron, Dexamethasone and Midazolam  Airway Management Planned: Oral ETT  Additional Equipment: None  Intra-op Plan:   Post-operative Plan: Extubation in OR  Informed Consent: I have reviewed the patients History and Physical, chart, labs and discussed the procedure including the risks, benefits and alternatives for the proposed anesthesia with the patient or authorized representative who has indicated his/her understanding and acceptance.     Dental advisory given  Plan Discussed with: CRNA and Surgeon  Anesthesia Plan Comments:         Anesthesia Quick Evaluation

## 2024-02-25 NOTE — Progress Notes (Signed)
 Subjective: Preop for bipolar replacement left hip   Objective: Vital signs in last 24 hours: Temp:  [98 F (36.7 C)-98.5 F (36.9 C)] 98.5 F (36.9 C) (03/19 0434) Pulse Rate:  [64-82] 82 (03/19 0434) Resp:  [11-19] 16 (03/18 1834) BP: (150-232)/(68-101) 163/76 (03/19 0434) SpO2:  [93 %-100 %] 97 % (03/19 0434) Weight:  [71.3 kg] 71.3 kg (03/18 1834)  Intake/Output from previous day: 03/18 0701 - 03/19 0700 In: 120 [P.O.:120] Out: 300 [Urine:300] Intake/Output this shift: Total I/O In: 120 [P.O.:120] Out: 300 [Urine:300]  Recent Labs    02/24/24 0818 02/25/24 0505  HGB 16.4* 15.0   Recent Labs    02/24/24 0818 02/25/24 0505  WBC 8.8 8.5  RBC 5.61* 5.17*  HCT 50.1* 45.8  PLT 164 157   Recent Labs    02/24/24 0818 02/25/24 0505  NA 139 138  K 3.5 3.8  CL 104 106  CO2 24 24  BUN 20 19  CREATININE 0.83 0.84  GLUCOSE 119* 117*  CALCIUM 9.3 8.8*   No results for input(s): "LABPT", "INR" in the last 72 hours.   Assessment/Plan: Patient looks to be suitable for surgical intervention today  Fuller Canada 02/25/2024, 6:56 AM

## 2024-02-25 NOTE — Brief Op Note (Signed)
 02/25/2024  2:49 PM  PATIENT:  Tiffany Everett  83 y.o. female  PRE-OPERATIVE DIAGNOSIS:  left femoral neck hip fracture  POST-OPERATIVE DIAGNOSIS:  left femoral neck hip fracture  PROCEDURE:  Procedure(s): HEMIARTHROPLASTY (BIPOLAR) HIP, POSTERIOR APPROACH FOR FRACTURE (Left)  SURGEON:  Surgeons and Role:    * Vickki Hearing, MD - Primary  PHYSICIAN ASSISTANT:   ASSISTANTS: cynthia wrenn   ANESTHESIA:   general  EBL:  100 mL   BLOOD ADMINISTERED:none  DRAINS: none   LOCAL MEDICATIONS USED:  MARCAINE     SPECIMEN:  No Specimen  DISPOSITION OF SPECIMEN:  N/A  COUNTS:  YES  TOURNIQUET:  * No tourniquets in log *  DICTATION: .Dragon Dictation  PLAN OF CARE: Admit to inpatient   PATIENT DISPOSITION:  PACU - hemodynamically stable.   Delay start of Pharmacological VTE agent (>24hrs) due to surgical blood loss or risk of bleeding: not applicable

## 2024-02-25 NOTE — Op Note (Signed)
 Orthopaedic Surgery Operative Note (CSN: [id number])  Tiffany Everett  12/06/1941 Date of Surgery: 02/25/2024   Diagnoses:  left femoral neck hip fracture  Procedure: Left hip hemiarthroplasty/bipolar replacement   TXA [used/not used] yes   Operative Finding Completely displaced left femoral neck fracture normal acetabulum   Post-Op Diagnosis: Same Surgeons:Primary: Vickki Hearing, MD Assistants: Aram Beecham and Location: [facility] OR ROOM 4 Anesthesia: General Antibiotics: Ancef 2 g  Estimated Blood Loss: 100 cc Complications: None Specimens: None   Implants: Implant Name Type Inv. Item Serial No. Manufacturer Lot No. LRB No. Used Action  STEM FEMORAL SZ8 STD ACTIS - [id number] Stem STEM FEMORAL SZ8 STD ACTIS  [employer] [id number] Left 1 Implanted  BIPOLAR DEPUY 49 - [id number] Hips BIPOLAR DEPUY 49  [employer]PU[employer] [id number] Left 1 Implanted  HIP BALL ARTICU 28 +5 - [id number] Hips HIP BALL ARTICU 28 +5  [employer]PU[employer] [id number] Left 1 Implanted     Indication for surgery completely displaced left femoral neck hip fracture  Transexamic  acid was given yes   DRAINS: none   LOCAL MEDICATIONS USED: Marcaine   SPECIMEN:  No Specimen  DISPOSITION OF SPECIMEN:  N/A  COUNTS: correct  DICTATION: .Dragon Dictation   The patient was taken to the recovery room in stable condition  PLAN OF CARE: Routine full weight-bear as tolerated anterior hip precautions DVT prophylaxis for 30 days  PATIENT DISPOSITION:  PACU - hemodynamically stable.   Delay start of Pharmacological VTE agent (>24hrs) due to surgical blood loss or risk of bleeding: No  Details of surgery: The patient was identified by 2 approved identification mechanisms. The operative extremity was evaluated and found to be acceptable for surgical treatment today. The chart was reviewed. The surgical site was confirmed initials were placed on the left hip  The patient was taken to the operating room  given 2 g of Ancef IV per SCIP protocol  The patient was given a general anesthetic and a Foley catheter was inserted  The patient was then transferred onto the operating room table and placed in the lateral decubitus position with an axillary roll and padding of the lower extremity  Stulberg hip positioner was used  The lower extremities and prepped and draped sterilely followed by timeout  Timeout was executed confirming the patient's name, surgical site, antibiotic administration, x-rays available, and implants were checked and were available.  Incision was made over the greater trochanter extended proximally and distally approximately 3 cm  Subcutaneous tissue was divided down to fascia which was then split in line with the skin incision and deep retractors were placed.  The greater trochanteric bursa was resected exposing the abductors   The gluteus medius anterior half was subperiosteally dissected from the greater trochanter in continuity with the gluteus minimus and tagged with #1 Vicryl sutures    2 Steinmann pins were then placed in the pelvis to retract the soft tissue.  Remaining anterior capsule was excised.   The femoral head was removed and measured 49  The acetabulum was inspected and was clear of any arthritis   The hip was dislocated anteriorly into a sterile bag  Proximal femur was prepared starting with a femoral neck cutting guide, box osteotome, and further preparation per manufacture technique  Broaching was started with a size 1 and broached up to appropriate size based on proximal fit and fill.  This was a size 8  Trial reduction was then performed using a 8 femoral approach, 49 head and +5 neck  Trial reduction we found the hip to be stable in all but sessions  The trial implants were then removed 2 drill holes were placed in the greater trochanter and a #5 Ethibond was passed through the drill holes   The acetabulum was irrigated and cleaned of any  bony debris, the  implants were placed and the hip was reduced  The hip was stable throughout the range of motion.    Hip flexion  Hip extension with external rotation  Sleep position stress test  Shuck test  Leg lengths  Local anesthetic arcane was injected in the adductor musculature  The abductors were repaired using the #5 Ethibond and then oversewn with #1 Braylon  The hip was then abducted the fascia was closed with #1 Braylon  Subcutaneous tissues were closed with 0  and staples  A sterile dressing was applied  The patient was taken recovery room in stable condition   Postop plan  Weightbearing as tolerated Direct lateral hip precautions DVT prophylaxis for 30 days Remove staples at 12 to 14 days Postop appointment scheduled for 28 days  [id number]

## 2024-02-25 NOTE — Anesthesia Postprocedure Evaluation (Signed)
 Anesthesia Post Note  Patient: Tiffany Everett  Procedure(s) Performed: HEMIARTHROPLASTY (BIPOLAR) HIP (Left: Hip)  Patient location during evaluation: PACU Anesthesia Type: General Level of consciousness: awake and alert Pain management: pain level controlled Vital Signs Assessment: post-procedure vital signs reviewed and stable Respiratory status: spontaneous breathing, nonlabored ventilation, respiratory function stable and patient connected to nasal cannula oxygen Cardiovascular status: blood pressure returned to baseline and stable Postop Assessment: no apparent nausea or vomiting Anesthetic complications: no   There were no known notable events for this encounter.   Last Vitals:  Vitals:   02/25/24 1542 02/25/24 1545  BP:  137/63  Pulse: 69 79  Resp: (!) 7 (!) 9  Temp:    SpO2: 97% 92%    Last Pain:  Vitals:   02/25/24 1545  TempSrc:   PainSc: 0-No pain                 Aiza Vollrath L Kla Bily

## 2024-02-25 NOTE — Transfer of Care (Signed)
 Immediate Anesthesia Transfer of Care Note  Patient: Tiffany Everett  Procedure(s) Performed: HEMIARTHROPLASTY (BIPOLAR) HIP (Left: Hip)  Patient Location: PACU  Anesthesia Type:General  Level of Consciousness: drowsy  Airway & Oxygen Therapy: Patient Spontanous Breathing and Patient connected to nasal cannula oxygen  Post-op Assessment: Report given to RN and Post -op Vital signs reviewed and stable  Post vital signs: Reviewed and stable  Last Vitals:  Vitals Value Taken Time  BP 174/72 02/25/24 1500  Temp  98   Pulse 78 02/25/24 1503  Resp 8 02/25/24 1503  SpO2 100 % 02/25/24 1503  Vitals shown include unfiled device data.  Last Pain:  Vitals:   02/25/24 1156  TempSrc:   PainSc: 3          Complications: No notable events documented.

## 2024-02-26 ENCOUNTER — Encounter (HOSPITAL_COMMUNITY): Payer: Self-pay | Admitting: Orthopedic Surgery

## 2024-02-26 DIAGNOSIS — R030 Elevated blood-pressure reading, without diagnosis of hypertension: Secondary | ICD-10-CM | POA: Diagnosis not present

## 2024-02-26 DIAGNOSIS — S72002A Fracture of unspecified part of neck of left femur, initial encounter for closed fracture: Secondary | ICD-10-CM | POA: Diagnosis not present

## 2024-02-26 LAB — CBC
HCT: 38.7 % (ref 36.0–46.0)
Hemoglobin: 12.8 g/dL (ref 12.0–15.0)
MCH: 29.4 pg (ref 26.0–34.0)
MCHC: 33.1 g/dL (ref 30.0–36.0)
MCV: 88.8 fL (ref 80.0–100.0)
Platelets: 159 10*3/uL (ref 150–400)
RBC: 4.36 MIL/uL (ref 3.87–5.11)
RDW: 13.8 % (ref 11.5–15.5)
WBC: 10.6 10*3/uL — ABNORMAL HIGH (ref 4.0–10.5)
nRBC: 0 % (ref 0.0–0.2)

## 2024-02-26 MED ORDER — ENSURE ENLIVE PO LIQD
237.0000 mL | Freq: Two times a day (BID) | ORAL | Status: DC
  Administered 2024-02-26 – 2024-02-27 (×2): 237 mL via ORAL

## 2024-02-26 MED ORDER — ADULT MULTIVITAMIN W/MINERALS CH
1.0000 | ORAL_TABLET | Freq: Every day | ORAL | Status: DC
  Administered 2024-02-26 – 2024-02-27 (×2): 1 via ORAL
  Filled 2024-02-26 (×2): qty 1

## 2024-02-26 MED ORDER — ENOXAPARIN SODIUM 40 MG/0.4ML IJ SOSY
40.0000 mg | PREFILLED_SYRINGE | INTRAMUSCULAR | Status: DC
  Administered 2024-02-26 – 2024-02-27 (×2): 40 mg via SUBCUTANEOUS
  Filled 2024-02-26 (×2): qty 0.4

## 2024-02-26 NOTE — Progress Notes (Signed)
 Subjective: 1 Day Post-Op Procedure(s) (LRB): HEMIARTHROPLASTY (BIPOLAR) HIP (Left) Patient reports pain as mild.    Objective: Vital signs in last 24 hours: Temp:  [97.6 F (36.4 C)-98.3 F (36.8 C)] 98.3 F (36.8 C) (03/20 0446) Pulse Rate:  [69-84] 77 (03/20 0446) Resp:  [7-17] 16 (03/20 0446) BP: (120-174)/(63-93) 120/77 (03/20 0446) SpO2:  [92 %-99 %] 98 % (03/20 0446) Weight:  [71.3 kg] 71.3 kg (03/19 1141)  Intake/Output from previous day: 03/19 0701 - 03/20 0700 In: 1040 [P.O.:240; I.V.:800] Out: 650 [Urine:550; Blood:100] Intake/Output this shift: No intake/output data recorded.  Recent Labs    02/24/24 0818 02/25/24 0505 02/26/24 0507  HGB 16.4* 15.0 12.8   Recent Labs    02/25/24 0505 02/26/24 0507  WBC 8.5 10.6*  RBC 5.17* 4.36  HCT 45.8 38.7  PLT 157 159   Recent Labs    02/24/24 0818 02/25/24 0505  NA 139 138  K 3.5 3.8  CL 104 106  CO2 24 24  BUN 20 19  CREATININE 0.83 0.84  GLUCOSE 119* 117*  CALCIUM 9.3 8.8*   No results for input(s): "LABPT", "INR" in the last 72 hours.  Neurologically intact Neurovascular intact Sensation intact distally Intact pulses distally Dorsiflexion/Plantar flexion intact Incision: dressing C/D/I   Assessment/Plan: 1 Day Post-Op Procedure(s) (LRB): HEMIARTHROPLASTY (BIPOLAR) HIP (Left) Advance diet Up with therapy D/C IV fluids Discharge to SNF in 1 to 2 days if stable  Orthopedic postop plan is recorded in the operative note      Fuller Canada 02/26/2024, 8:39 AM

## 2024-02-26 NOTE — TOC Progression Note (Addendum)
 Transition of Care [facility]) - Progression Note    Patient Details  Name: Tiffany Everett MRN: [id number] Date of Birth: Dec 22, 1940  [facility]) CM/SW Contact  Leitha Bleak, RN Phone Number: 02/26/2024, 1:01 PM  Clinical Narrative:   Discussed bed offer with patient, she wants [facility]. TOC CMA is starting English as a second language teacher.   Addendum :  Insurance approved - certification # [id number] approved 3/20-3/26    Expected Discharge Plan: Skilled Nursing Facility Barriers to Discharge: SNF Pending bed offer, Insurance Authorization  Expected Discharge Plan and Services      Living arrangements for the past 2 months: Single Family Home       Social Determinants of Health (SDOH) Interventions SDOH Screenings   Food Insecurity: No Food Insecurity (02/24/2024)  Housing: Low Risk  (02/24/2024)  Transportation Needs: No Transportation Needs (02/24/2024)  Utilities: Not At Risk (02/24/2024)  Financial Resource Strain: Low Risk  (10/31/2023)   Received from [facility]  Physical Activity: Unknown (10/31/2023)   Received from [facility]  Social Connections: Moderately Integrated (02/24/2024)  Stress: No Stress Concern Present (10/31/2023)   Received [facility]  Tobacco Use: Low Risk  (02/25/2024)    Readmission Risk Interventions     No data to display

## 2024-02-26 NOTE — TOC Progression Note (Signed)
 Transition of Care [facility]) - Progression Note    Patient Details  Name: Tiffany Everett MRN: [id number] Date of Birth: 1941/10/06  [facility]) CM/SW Contact  Leitha Bleak, RN Phone Number: 02/26/2024, 12:06 PM  Clinical Narrative:   CM spoke with patient to discuss her PT eval. She is agreeable to SNF. [facility] is her first choice.  FL2 completed and sent out for bed offers. TOC following to start Auth.    Expected Discharge Plan: Skilled Nursing Facility Barriers to Discharge: SNF Pending bed offer, Insurance Authorization  Expected Discharge Plan and Services      Living arrangements for the past 2 months: Single Family Home                   Social Determinants of Health (SDOH) Interventions SDOH Screenings   Food Insecurity: No Food Insecurity (02/24/2024)  Housing: Low Risk  (02/24/2024)  Transportation Needs: No Transportation Needs (02/24/2024)  Utilities: Not At Risk (02/24/2024)  Financial Resource Strain: Low Risk  (10/31/2023)   Received from [facility]  Physical Activity: Unknown (10/31/2023)   Received from [facility]  Social Connections: Moderately Integrated (02/24/2024)  Stress: No Stress Concern Present (10/31/2023)   Received from [facility]  Tobacco Use: Low Risk  (02/25/2024)    Readmission Risk Interventions     No data to display

## 2024-02-26 NOTE — [place] FL2 (Signed)
 [place] MEDICAID FL2 LEVEL OF CARE FORM     IDENTIFICATION  Patient Name: Tiffany Everett Birthdate: [birth year] Sex: female Admission Date (Current Location): 02/24/2024  [facility] and [place] Number:      Facility and Address:  [facility],  [address] [place] [address]      Provider Number: [id number]  Attending Physician Name and Address:  Vassie Loll, MD  Relative Name and Phone Number:  Darlis, [place])  [phone]    Current Level of Care: Hospital Recommended Level of Care: Skilled Nursing Facility Prior Approval Number:    Date Approved/Denied:   PASRR Number: [id number]  Discharge Plan: SNF    Current Diagnoses: Patient Active Problem List   Diagnosis Date Noted   Closed displaced fracture of left femoral neck (HCC) 02/24/2024   Borderline prolonged QT interval 02/02/2020   Concussion with brief loss of consciousness 02/01/2020   Polycythemia 02/01/2020   Elevated CK 02/01/2020   Hypertension    Hypothermia 11/25/2017   N&V (nausea and vomiting) 11/25/2017   History of colonic polyps 04/24/2015    Orientation RESPIRATION BLADDER Height & Weight     Self, Time, Situation, Place  Normal Continent Weight: 71.3 kg Height:  5\' 6"  (167.6 cm)  BEHAVIORAL SYMPTOMS/MOOD NEUROLOGICAL BOWEL NUTRITION STATUS      Continent Diet (See DC summary)  AMBULATORY STATUS COMMUNICATION OF NEEDS Skin   Extensive Assist Verbally Surgical wounds, Bruising                       Personal Care Assistance Level of Assistance  Bathing, Feeding, Dressing Bathing Assistance: Maximum assistance Feeding assistance: Maximum assistance Dressing Assistance: Limited assistance     Functional Limitations Info  Sight, Hearing, Speech Sight Info: Adequate Hearing Info: Adequate Speech Info: Adequate    SPECIAL CARE FACTORS FREQUENCY  PT (By licensed PT)     PT Frequency: 5 times a week              Contractures Contractures Info: Not  present    Additional Factors Info  Code Status, Allergies Code Status Info: Full Allergies Info: NKDA           Current Medications (02/26/2024):  This is the current hospital active medication list Current Facility-Administered Medications  Medication Dose Route Frequency Provider Last Rate Last Admin   0.9 %  sodium chloride infusion   Intravenous Continuous Vickki Hearing, MD 40 mL/hr at 02/25/24 1859 New Bag at 02/25/24 1859   acetaminophen (TYLENOL) tablet 325-650 mg  325-650 mg Oral Q6H PRN Vickki Hearing, MD       acetaminophen (TYLENOL) tablet 500 mg  500 mg Oral Q6H Vickki Hearing, MD   500 mg at 02/26/24 0543   celecoxib (CELEBREX) capsule 200 mg  200 mg Oral BID Vickki Hearing, MD   200 mg at 02/26/24 1024   docusate sodium (COLACE) capsule 100 mg  100 mg Oral BID Vickki Hearing, MD   100 mg at 02/26/24 1022   enoxaparin (LOVENOX) injection 40 mg  40 mg Subcutaneous Q24H Vassie Loll, MD   40 mg at 02/26/24 1023   hydrALAZINE (APRESOLINE) injection 10 mg  10 mg Intravenous Q8H PRN Vickki Hearing, MD       HYDROcodone-acetaminophen (NORCO) 7.5-325 MG per tablet 1-2 tablet  1-2 tablet Oral Q4H PRN Vickki Hearing, MD       HYDROcodone-acetaminophen (NORCO/VICODIN) 5-325 MG per tablet 1-2 tablet  1-2 tablet  Oral Q4H PRN Vickki Hearing, MD       ketorolac (TORADOL) 15 MG/ML injection 7.5 mg  7.5 mg Intravenous Q6H Vickki Hearing, MD   7.5 mg at 02/26/24 0543   menthol-cetylpyridinium (CEPACOL) lozenge 3 mg  1 lozenge Oral PRN Vickki Hearing, MD       Or   phenol (CHLORASEPTIC) mouth spray 1 spray  1 spray Mouth/Throat PRN Vickki Hearing, MD       methocarbamol (ROBAXIN) tablet 500 mg  500 mg Oral Q6H PRN Vickki Hearing, MD   500 mg at 02/25/24 2200   Or   methocarbamol (ROBAXIN) injection 500 mg  500 mg Intravenous Q6H PRN Vickki Hearing, MD       metoCLOPramide (REGLAN) tablet 5-10 mg  5-10 mg Oral Q8H PRN  Vickki Hearing, MD       Or   metoCLOPramide (REGLAN) injection 5-10 mg  5-10 mg Intravenous Q8H PRN Vickki Hearing, MD       metoprolol succinate (TOPROL-XL) 24 hr tablet 25 mg  25 mg Oral Daily Vickki Hearing, MD   25 mg at 02/26/24 1024   morphine (PF) 2 MG/ML injection 0.5-1 mg  0.5-1 mg Intravenous Q2H PRN Vickki Hearing, MD       mupirocin ointment (BACTROBAN) 2 % 1 Application  1 Application Nasal BID Vickki Hearing, MD   1 Application at 02/26/24 1026   pneumococcal 20-valent conjugate vaccine (PREVNAR 20) injection 0.5 mL  0.5 mL Intramuscular Tomorrow-1000 Vickki Hearing, MD       simvastatin (ZOCOR) tablet 10 mg  10 mg Oral Daily Vickki Hearing, MD   10 mg at 02/26/24 1025   venlafaxine XR (EFFEXOR-XR) 24 hr capsule 37.5 mg  37.5 mg Oral Daily Vickki Hearing, MD   37.5 mg at 02/26/24 1023     Discharge Medications: Please see discharge summary for a list of discharge medications.  Relevant Imaging Results:  Relevant Lab Results:   Additional Information SS# [id number]  Leitha Bleak, RN

## 2024-02-26 NOTE — Evaluation (Signed)
 Physical Therapy Evaluation Patient Details Name: Tiffany Everett MRN: [id number] DOB: 09/29/1941 Today's Date: 02/26/2024  History of Present Illness  Tiffany Everett is a 83 y.o. female s/p Left hip hemiarthroplasty/bipolar replacement on 02/25/24, with medical history significant of hypertension, depression/anxiety and history of vitamin D deficiency; who presented to the hospital secondary to hip pain and inability to bear weight after fall this morning.  Patient expressed waking up to go to the bathroom when she essentially passed out and ended up falling on her left side.  She experienced significant pain and discomfort from his left hip and was unable to bear weight.    There has not been any fever, dysuria, hematuria, focal weaknesses prior to this event, sick contact, nausea, vomiting, diarrhea, chest pain or shortness of breath.     Workup in the ED demonstrating left neck femur fracture mildly displaced; CT scan of the head without acute intracranial normalities and no cardiopulmonary process appreciated.  Blood work for the most part stable.  Orthopedic service has been consulted and TRH contacted to place patient in the hospital for further evaluation and management.  02/25/2024  12:45 PM     Tiffany Everett  has presented today for surgery, with the diagnosis of left femoral neck hip fracture.   Clinical Impression  Pt demonstrates decreased independence with bed mobility, functional transfers, and ambulation. Pt shows decreased LE strength, LLE ROM, and decreased endurance. Pt able to initiate gait pattern although antalgic and decreased quality. Pt is very dependent on UE support at this time and needs the most assistance when descending to get on commode while keeping hip precautions. Pt would continue to benefit from skilled PT to address states deficits for improved level of function, increased independence with ambulation and overall improved quality of life.      If plan is  discharge home, recommend the following: A lot of help with bathing/dressing/bathroom;A lot of help with walking and/or transfers;Assistance with cooking/housework;Assist for transportation;Help with stairs or ramp for entrance   Can travel by private vehicle   No    Equipment Recommendations BSC/3in1  Recommendations for Other Services       Functional Status Assessment Patient has had a recent decline in their functional status and demonstrates the ability to make significant improvements in function in a reasonable and predictable amount of time.     Precautions / Restrictions Precautions Precautions: Fall (lateral hip) Recall of Precautions/Restrictions: Intact Restrictions Weight Bearing Restrictions Per Provider Order: Yes (WBAT)      Mobility  Bed Mobility Overal bed mobility: Needs Assistance Bed Mobility: Supine to Sit     Supine to sit: Min assist, Mod assist     General bed mobility comments: Pt requires increased assistance to follow precautions.    Transfers Overall transfer level: Needs assistance Equipment used: Rolling walker (2 wheels), 1 person hand held assist Transfers: Sit to/from Stand Sit to Stand: Min assist           General transfer comment: Pt heavily dependent on RLE and UEs for all transfers    Ambulation/Gait Ambulation/Gait assistance: Contact guard assist, Min assist Gait Distance (Feet): 15 Feet Assistive device: Rolling walker (2 wheels) Gait Pattern/deviations: Step-to pattern, Decreased step length - right, Decreased step length - left, Decreased stride length, Antalgic Gait velocity: decreased     General Gait Details: Pt requires SBA and RW for ambulation.  Stairs            Psychologist, prison and probation services  Tilt Bed    Modified Rankin (Stroke Patients Only)       Balance Overall balance assessment: Needs assistance Sitting-balance support: No upper extremity supported Sitting balance-Leahy Scale: Good      Standing balance support: Bilateral upper extremity supported Standing balance-Leahy Scale: Poor Standing balance comment: poor-fair                             Pertinent Vitals/Pain Pain Assessment Pain Assessment: 0-10 Pain Score: 0-No pain Pain Location: left hip, pain increased to 5/10 during ambulation Pain Intervention(s): Limited activity within patient's tolerance, Repositioned, RN gave pain meds during session    Home Living Family/patient expects to be discharged to:: Private residence Living Arrangements: Children Available Help at Discharge: Family;Available PRN/intermittently Type of Home: House Home Access: Stairs to enter   [employer] of Steps: 4, has ramp now and railing for hadicapped son   Home Layout: One level Home Equipment: Agricultural consultant (2 wheels);Cane - single point      Prior Function Prior Level of Function : Independent/Modified Independent             Mobility Comments: community ambulator, independent ADLs Comments: cared for handicapped son as well     Extremity/Trunk Assessment   Upper Extremity Assessment Upper Extremity Assessment: Overall WFL for tasks assessed    Lower Extremity Assessment Lower Extremity Assessment: Generalized weakness;LLE deficits/detail LLE Deficits / Details: decreased ROM and strength hip and knee    Cervical / Trunk Assessment Cervical / Trunk Assessment: Kyphotic  Communication   Communication Communication: Other (comment);No apparent difficulties (pt does not respond immediately, increased time)    Cognition Arousal: Alert Behavior During Therapy: WFL for tasks assessed/performed   PT - Cognitive impairments: No apparent impairments                         Following commands: Intact       Cueing Cueing Techniques: Verbal cues, Tactile cues     General Comments      Exercises     Assessment/Plan    PT Assessment Patient needs continued PT services   PT Problem List Decreased strength;Decreased range of motion;Decreased activity tolerance;Decreased balance;Decreased mobility;Pain       PT Treatment Interventions DME instruction;Gait training;Stair training;Functional mobility training;Therapeutic activities;Therapeutic exercise;Balance training;Patient/family education    PT Goals (Current goals can be found in the Care Plan section)  Acute Rehab PT Goals Patient Stated Goal: to get stronger to be able to return home PT Goal Formulation: With patient Time For Goal Achievement: 03/11/24 Potential to Achieve Goals: Good    Frequency Min 3X/week     Co-evaluation               AM-PAC PT "6 Clicks" Mobility  Outcome Measure Help needed turning from your back to your side while in a flat bed without using bedrails?: A Little Help needed moving from lying on your back to sitting on the side of a flat bed without using bedrails?: A Little Help needed moving to and from a bed to a chair (including a wheelchair)?: A Lot Help needed standing up from a chair using your arms (e.g., wheelchair or bedside chair)?: A Lot Help needed to walk in hospital room?: A Lot Help needed climbing 3-5 steps with a railing? : A Lot 6 Click Score: 14    End of Session Equipment Utilized During Treatment: Gait belt Activity Tolerance:  Patient tolerated treatment well;Patient limited by fatigue;Patient limited by pain Patient left: in chair;with call bell/phone within reach;with chair alarm set Nurse Communication: Mobility status PT Visit Diagnosis: Unsteadiness on feet (R26.81);Other abnormalities of gait and mobility (R26.89);Pain;History of falling (Z91.81);Muscle weakness (generalized) (M62.81);Difficulty in walking, not elsewhere classified (R26.2) Pain - Right/Left: Left Pain - part of body: Hip    Time: 1610-9604 PT Time Calculation (min) (ACUTE ONLY): 45 min   Charges:   PT Evaluation $PT Eval Low Complexity: 1 Low PT  Treatments $Therapeutic Activity: 23-37 mins PT General Charges $$ ACUTE PT VISIT: 1 Visit         Luz Lex, PT, DPT [facility] Office: [phone] 2:10 PM, 02/26/24

## 2024-02-26 NOTE — Plan of Care (Signed)
  Problem: Acute Rehab PT Goals(only PT should resolve) Goal: Pt Will Go Supine/Side To Sit Outcome: Progressing Flowsheets (Taken 02/26/2024 1411) Pt will go Supine/Side to Sit: with supervision Goal: Patient Will Transfer Sit To/From Stand Outcome: Progressing Flowsheets (Taken 02/26/2024 1411) Patient will transfer sit to/from stand:  with supervision  with contact guard assist Goal: Pt Will Transfer Bed To Chair/Chair To Bed Outcome: Progressing Flowsheets (Taken 02/26/2024 1411) Pt will Transfer Bed to Chair/Chair to Bed:  with contact guard assist  with min assist Goal: Pt Will Ambulate Outcome: Progressing Flowsheets (Taken 02/26/2024 1411) Pt will Ambulate:  100 feet  with contact guard assist  with modified independence   Luz Lex, PT, DPT [facility] Office: [phone] 2:12 PM, 02/26/24

## 2024-02-26 NOTE — Progress Notes (Signed)
  Progress Note   Patient: Tiffany Everett:[id number] DOB: [birth year] DOA: 02/24/2024     2 DOS: the patient was seen and examined on 02/26/2024   Brief hospital admission narrative: Tiffany Everett is a 83 y.o. female with medical history significant of hypertension, depression/anxiety and history of vitamin D deficiency; who presented to the hospital secondary to hip pain and inability to bear weight after fall this morning.  Patient expressed waking up to go to the bathroom when she essentially passed out and ended up falling on her left side.  She experienced significant pain and discomfort from his left hip and was unable to bear weight.   There has not been any fever, dysuria, hematuria, focal weaknesses prior to this event, sick contact, nausea, vomiting, diarrhea, chest pain or shortness of breath.   Workup in the ED demonstrating left neck femur fracture mildly displaced; CT scan of the head without acute intracranial normalities and no cardiopulmonary process appreciated.  Blood work for the most part stable.  Orthopedic service has been consulted and TRH contacted to place patient in the hospital for further evaluation and management.  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 -Weightbearing as tolerated -Continue as needed analgesics for pain control -Direct lateral hip precautions recommended. -Follow-up with orthopedic service in 28 days; okay to remove staples in 14 days.  2-hyperlipidemia -Continue statin.  3-history of depression: -Continue Effexor  4-hypertension -Continue home antihypertensive agents and follow vital signs fluctuation -Will adjust medication as needed.  Subjective:  No fever, no chest pain, no nausea, no vomiting.  Overall expressed pain to be well-controlled.  Physical  Exam: Vitals:   02/25/24 1545 02/25/24 2043 02/26/24 0446 02/26/24 1020  BP: 137/63 127/63 120/77 (!) 141/53  Pulse: 79 71 77 83  Resp: (!) 9 16 16    Temp:  97.6 F (36.4 C) 98.3 F (36.8 C) 98.6 F (37 C)  TempSrc:  Oral Oral Oral  SpO2: 92% 99% 98% 98%  Weight:      Height:       General exam: Alert, awake, oriented x 3; denying chest pain or shortness of breath.  Tolerated surgical procedure well.  Overall pain is controlled. Respiratory system: No using accessory muscle.  Good saturation on room air. Cardiovascular system:RRR.  No rubs, no gallops, no JVD. Gastrointestinal system: Abdomen is nondistended, soft and nontender. No organomegaly or masses felt. Normal bowel sounds heard. Central nervous system: Alert and oriented. No focal neurological deficits. Extremities: No cyanosis or clubbing. Skin: No petechiae; lateral hip wound clean and dry; dressings in place. Psychiatry: Judgement and insight appear normal. Mood & affect appropriate.    Latest data Reviewed: Basic metabolic panel: Sodium 138, potassium 3.8, chloride 106, bicarb 24 BUN 19 creatinine 0.84 Magnesium: 2.2 CBC: WBC 10.6, hemoglobin 12.8 and platelet count 159K   Family Communication: No family at bedside on today's evaluation.  Disposition: Status is: Inpatient Remains inpatient appropriate because: Continue supportive care, IV analgesics and follow postoperative recommendations after surgical repair of left hip.   Planned Discharge Destination: Skilled nursing facility.  Time spent: 50 minutes  Author: Vassie Loll, MD 02/26/2024 5:14 PM  For on call review [email or web address].

## 2024-02-26 NOTE — Progress Notes (Addendum)
 Initial Nutrition Assessment  DOCUMENTATION CODES:   Not applicable  INTERVENTION:   Ensure Enlive po BID, each supplement provides 350 kcal and 20 grams of protein. MVI with minerals daily.  NUTRITION DIAGNOSIS:   Increased nutrient needs related to hip fracture, post-op healing as evidenced by estimated needs.  GOAL:   Patient will meet greater than or equal to 90% of their needs  MONITOR:   PO intake, Supplement acceptance  REASON FOR ASSESSMENT:   Consult Hip fracture protocol  ASSESSMENT:   83 yo female admitted with L hip fracture S/P fall at home. PMH includes HTN, anxiety, vitamin D deficiency.  S/P L hemiarthroplasty 3/19.  Patient is currently on a regular diet. Meal intakes good at 50-100%.  Labs reviewed.  Medications reviewed and include colace.  Weight history reviewed.   12% weight loss since June 2023, not significant for the time frame.  No nutrition problems identified on admission.   NUTRITION - FOCUSED PHYSICAL EXAM:  Unable to complete, RD working remotely  Diet Order:   Diet Order             Diet regular Room service appropriate? Yes; Fluid consistency: Thin  Diet effective now                   EDUCATION NEEDS:   No education needs have been identified at this time  Skin:  Skin Assessment: Reviewed RN Assessment  Last BM:  3/17  Height:   Ht Readings from Last 1 Encounters:  02/25/24 5\' 6"  (1.676 m)    Weight:   Wt Readings from Last 1 Encounters:  02/25/24 71.3 kg    BMI:  Body mass index is 25.37 kg/m.  Estimated Nutritional Needs:   Kcal:  1650-1850  Protein:  75-85 gm  Fluid:  1.6-1.8 L   Gabriel Rainwater RD, LDN, CNSC Contact via secure chat. If unavailable, use group chat "RD Inpatient."

## 2024-02-27 DIAGNOSIS — S72002A Fracture of unspecified part of neck of left femur, initial encounter for closed fracture: Secondary | ICD-10-CM | POA: Diagnosis not present

## 2024-02-27 DIAGNOSIS — Z9181 History of falling: Secondary | ICD-10-CM | POA: Diagnosis not present

## 2024-02-27 DIAGNOSIS — K219 Gastro-esophageal reflux disease without esophagitis: Secondary | ICD-10-CM

## 2024-02-27 DIAGNOSIS — S72002D Fracture of unspecified part of neck of left femur, subsequent encounter for closed fracture with routine healing: Secondary | ICD-10-CM | POA: Diagnosis not present

## 2024-02-27 DIAGNOSIS — E559 Vitamin D deficiency, unspecified: Secondary | ICD-10-CM | POA: Diagnosis not present

## 2024-02-27 DIAGNOSIS — E785 Hyperlipidemia, unspecified: Secondary | ICD-10-CM | POA: Diagnosis not present

## 2024-02-27 DIAGNOSIS — F411 Generalized anxiety disorder: Secondary | ICD-10-CM | POA: Diagnosis not present

## 2024-02-27 DIAGNOSIS — Z96642 Presence of left artificial hip joint: Secondary | ICD-10-CM | POA: Diagnosis not present

## 2024-02-27 DIAGNOSIS — M6281 Muscle weakness (generalized): Secondary | ICD-10-CM | POA: Diagnosis not present

## 2024-02-27 DIAGNOSIS — I10 Essential (primary) hypertension: Secondary | ICD-10-CM

## 2024-02-27 DIAGNOSIS — F339 Major depressive disorder, recurrent, unspecified: Secondary | ICD-10-CM | POA: Diagnosis not present

## 2024-02-27 DIAGNOSIS — R2681 Unsteadiness on feet: Secondary | ICD-10-CM | POA: Diagnosis not present

## 2024-02-27 DIAGNOSIS — R030 Elevated blood-pressure reading, without diagnosis of hypertension: Secondary | ICD-10-CM | POA: Diagnosis not present

## 2024-02-27 DIAGNOSIS — R9431 Abnormal electrocardiogram [ECG] [EKG]: Secondary | ICD-10-CM | POA: Diagnosis not present

## 2024-02-27 DIAGNOSIS — R488 Other symbolic dysfunctions: Secondary | ICD-10-CM | POA: Diagnosis not present

## 2024-02-27 DIAGNOSIS — Z471 Aftercare following joint replacement surgery: Secondary | ICD-10-CM | POA: Diagnosis not present

## 2024-02-27 LAB — CBC
HCT: 33.4 % — ABNORMAL LOW (ref 36.0–46.0)
Hemoglobin: 11.1 g/dL — ABNORMAL LOW (ref 12.0–15.0)
MCH: 29.1 pg (ref 26.0–34.0)
MCHC: 33.2 g/dL (ref 30.0–36.0)
MCV: 87.4 fL (ref 80.0–100.0)
Platelets: 151 10*3/uL (ref 150–400)
RBC: 3.82 MIL/uL — ABNORMAL LOW (ref 3.87–5.11)
RDW: 14.2 % (ref 11.5–15.5)
WBC: 8.3 10*3/uL (ref 4.0–10.5)
nRBC: 0 % (ref 0.0–0.2)

## 2024-02-27 MED ORDER — APIXABAN 2.5 MG PO TABS: 2.5000 mg | ORAL_TABLET | Freq: Two times a day (BID) | ORAL | 0 refills | Status: AC

## 2024-02-27 MED ORDER — PANTOPRAZOLE SODIUM 40 MG PO TBEC: 40.0000 mg | DELAYED_RELEASE_TABLET | Freq: Every day | ORAL | 1 refills | Status: AC

## 2024-02-27 MED ORDER — ADULT MULTIVITAMIN W/MINERALS CH: 1.0000 | ORAL_TABLET | Freq: Every day | ORAL | Status: AC

## 2024-02-27 MED ORDER — CELECOXIB 200 MG PO CAPS: 200.0000 mg | ORAL_CAPSULE | Freq: Two times a day (BID) | ORAL | Status: AC

## 2024-02-27 MED ORDER — DOCUSATE SODIUM 100 MG PO CAPS: 100.0000 mg | ORAL_CAPSULE | Freq: Two times a day (BID) | ORAL | 0 refills | Status: AC

## 2024-02-27 MED ORDER — HYDROCODONE-ACETAMINOPHEN 7.5-325 MG PO TABS
1.0000 | ORAL_TABLET | Freq: Four times a day (QID) | ORAL | 0 refills | Status: DC | PRN
Start: 2024-02-27 — End: 2024-03-12

## 2024-02-27 MED ORDER — METHOCARBAMOL 500 MG PO TABS: 500.0000 mg | ORAL_TABLET | Freq: Three times a day (TID) | ORAL | Status: AC | PRN

## 2024-02-27 MED ORDER — POLYETHYLENE GLYCOL 3350 17 G PO PACK: 17.0000 g | PACK | Freq: Every day | ORAL | 0 refills | Status: AC | PRN

## 2024-02-27 NOTE — Progress Notes (Signed)
 Subjective: 2 Days Post-Op Procedure(s) (LRB): HEMIARTHROPLASTY (BIPOLAR) HIP (Left) Patient reports pain as mild.    Objective: Vital signs in last 24 hours: Temp:  [98.1 F (36.7 C)-98.6 F (37 C)] 98.1 F (36.7 C) (03/21 0554) Pulse Rate:  [82-83] 82 (03/21 0554) Resp:  [16] 16 (03/21 0554) BP: (141-166)/(53-71) 157/71 (03/21 0554) SpO2:  [92 %-98 %] 92 % (03/21 0554)  Intake/Output from previous day: 03/20 0701 - 03/21 0700 In: 1276.8 [P.O.:900; I.V.:376.8] Out: -  Intake/Output this shift: No intake/output data recorded.  Recent Labs    02/24/24 0818 02/25/24 0505 02/26/24 0507 02/27/24 0502  HGB 16.4* 15.0 12.8 11.1*   Recent Labs    02/26/24 0507 02/27/24 0502  WBC 10.6* 8.3  RBC 4.36 3.82*  HCT 38.7 33.4*  PLT 159 151   Recent Labs    02/24/24 0818 02/25/24 0505  NA 139 138  K 3.5 3.8  CL 104 106  CO2 24 24  BUN 20 19  CREATININE 0.83 0.84  GLUCOSE 119* 117*  CALCIUM 9.3 8.8*   No results for input(s): "LABPT", "INR" in the last 72 hours. Patient exhibits confusion Neurovascular intact Sensation intact distally Intact pulses distally Dorsiflexion/Plantar flexion intact Incision: dressing C/D/I   Assessment/Plan: 2 Days Post-Op Procedure(s) (LRB): HEMIARTHROPLASTY (BIPOLAR) HIP (Left) Discharge to SNF      [facility] 02/27/2024, 7:32 AM

## 2024-02-27 NOTE — TOC Transition Note (Signed)
 Transition of Care [facility]) - Discharge Note   Patient Details  Name: Tiffany Everett MRN: [id number] Date of Birth: [birth year]  Transition of [facility]) CM/SW Contact:  Leitha Bleak, RN Phone Number: 02/27/2024, 11:45 AM   Clinical Narrative:   Patient is medically ready to discharge to [facility]. DC summary sent in the hub. RN calling report. TOC left Tami a message for room number. Staff will transport. Patient states she will update her family.    Final next level of care: Skilled Nursing Facility Barriers to Discharge: Barriers Resolved   Patient Goals and CMS Choice Patient states their goals for this hospitalization and ongoing recovery are:: agreeable to SNF CMS Medicare.gov Compare Post Acute Care list provided to:: Patient Choice offered to / list presented to : Patient [place] ownership interest in [facility].provided to:: Patient    Discharge Placement               Patient to be transferred to facility by: Staff Name of family member notified: Paient will update family Patient and family notified of of transfer: 02/27/24  Discharge Plan and Services Additional resources added to the After Visit Summary for          Social Drivers of Health (SDOH) Interventions SDOH Screenings   Food Insecurity: No Food Insecurity (02/24/2024)  Housing: Low Risk  (02/24/2024)  Transportation Needs: No Transportation Needs (02/24/2024)  Utilities: Not At Risk (02/24/2024)  Financial Resource Strain: Low Risk  (10/31/2023)   Received from [facility]  Physical Activity: Unknown (10/31/2023)   Received from [facility]  Social Connections: Moderately Integrated (02/24/2024)  Stress: No Stress Concern Present (10/31/2023)   Received from [facility]  Tobacco Use: Low Risk  (02/25/2024)    Readmission Risk Interventions    02/27/2024   11:41 AM  Readmission Risk Prevention Plan  Post Dischage Appt Complete  Medication Screening Complete   Transportation Screening Complete

## 2024-02-27 NOTE — Plan of Care (Signed)

## 2024-02-27 NOTE — Consult Note (Signed)
 Value-Based Care [facility] Liaison Consult Note   02/27/2024  Tiffany Everett 1941/01/04 [id number]  Primary Care Provider:  Roe Rutherford, NP [facility])  Insurance: [facility]  Chart was review due to the green banner. Pt has an unaffiliated provider and not eligible for VBCI services.    Of note, Care Management services does not replace or interfere with any services that are needed or arranged by inpatient [facility] care management team.   For additional questions or referrals please contact:  Elliot Cousin, RN, BSN Hospital Liaison [place]   [facility], Population Health Office Hours MTWF  8:00 am-6:00 pm Direct Dial: [phone] mobile [email or web address]

## 2024-02-27 NOTE — Progress Notes (Signed)
 Physical Therapy Treatment Patient Details Name: Tiffany Everett MRN: [id number] DOB: 1941/06/10 Today's Date: 02/27/2024   History of Present Illness Tiffany Everett is a 83 y.o. female s/p Left hip hemiarthroplasty/bipolar replacement on 02/25/24, with medical history significant of hypertension, depression/anxiety and history of vitamin D deficiency; who presented to the hospital secondary to hip pain and inability to bear weight after fall this morning.  Patient expressed waking up to go to the bathroom when she essentially passed out and ended up falling on her left side.  She experienced significant pain and discomfort from his left hip and was unable to bear weight.    There has not been any fever, dysuria, hematuria, focal weaknesses prior to this event, sick contact, nausea, vomiting, diarrhea, chest pain or shortness of breath.     Workup in the ED demonstrating left neck femur fracture mildly displaced; CT scan of the head without acute intracranial normalities and no cardiopulmonary process appreciated.  Blood work for the most part stable.  Orthopedic service has been consulted and [facility] contacted to place patient in the hospital for further evaluation and management.  02/25/2024  12:45 PM     Tiffany Everett  has presented today for surgery, with the diagnosis of left femoral neck hip fracture.    PT Comments  Patient agreeable for therapy.  Patient demonstrates slow labored movement for sitting up at bedside with difficulty moving LLE due to pain/weakness, tolerated taking a few steps forward/backwards before having to sit due to fatigue. Patient tolerated sitting up in chair after therapy. Patient will benefit from continued skilled physical therapy in hospital and recommended venue below to increase strength, balance, endurance for safe ADLs and gait.       If plan is discharge home, recommend the following: A lot of help with bathing/dressing/bathroom;A lot of help with walking and/or  transfers;Assistance with cooking/housework;Assist for transportation;Help with stairs or ramp for entrance   Can travel by private vehicle     No  Equipment Recommendations  BSC/3in1    Recommendations for Other Services       Precautions / Restrictions Precautions Precautions: Fall Restrictions Weight Bearing Restrictions Per Provider Order: Yes LLE Weight Bearing Per Provider Order: Weight bearing as tolerated     Mobility  Bed Mobility Overal bed mobility: Needs Assistance Bed Mobility: Supine to Sit     Supine to sit: Min assist, Mod assist     General bed mobility comments: slow labored movemet with diffiuclty moving LLE    Transfers Overall transfer level: Needs assistance Equipment used: Rolling walker (2 wheels), 1 person hand held assist Transfers: Sit to/from Stand, Bed to chair/wheelchair/BSC Sit to Stand: Min assist   Step pivot transfers: Min assist, Mod assist       General transfer comment: unsteady labored movement    Ambulation/Gait Ambulation/Gait assistance: Min assist, Mod assist Gait Distance (Feet): 10 Feet Assistive device: Rolling walker (2 wheels) Gait Pattern/deviations: Step-to pattern, Decreased step length - right, Decreased step length - left, Decreased stride length, Antalgic Gait velocity: slow     General Gait Details: limted to a few steps forward/backwards at bedside before having to sit due to fatigue, left hip pain   Stairs             Wheelchair Mobility     Tilt Bed    Modified Rankin (Stroke Patients Only)       Balance Overall balance assessment: Needs assistance Sitting-balance support: Feet supported, No upper extremity supported  Sitting balance-Leahy Scale: Good Sitting balance - Comments: seated at EOB   Standing balance support: Reliant on assistive device for balance, During functional activity, Bilateral upper extremity supported Standing balance-Leahy Scale: Poor Standing balance  comment: fair/poor using RW                            Communication    Cognition Arousal: Alert Behavior During Therapy: WFL for tasks assessed/performed   PT - Cognitive impairments: No apparent impairments                         Following commands: Intact      Cueing Cueing Techniques: Verbal cues, Tactile cues  Exercises General Exercises - Lower Extremity Long Arc Quad: Seated, AROM, Strengthening, Both, 10 reps Hip Flexion/Marching: Seated, AROM, Strengthening, Both, 10 reps Toe Raises: Seated, AROM, Strengthening, Both, 10 reps Heel Raises: PROM, Seated, AROM, Strengthening, Both, 10 reps    General Comments        Pertinent Vitals/Pain Pain Assessment Pain Assessment: 0-10 Pain Score: 6  Pain Location: left hip Pain Descriptors / Indicators: Discomfort, Sore Pain Intervention(s): Limited activity within patient's tolerance, Monitored during session, Repositioned    Home Living                          Prior Function            PT Goals (current goals can now be found in the care plan section) Acute Rehab PT Goals Patient Stated Goal: to get stronger to be able to return home PT Goal Formulation: With patient Time For Goal Achievement: 03/11/24 Potential to Achieve Goals: Good    Frequency    Min 3X/week      PT Plan      Co-evaluation              AM-PAC PT "6 Clicks" Mobility   Outcome Measure  Help needed turning from your back to your side while in a flat bed without using bedrails?: A Little Help needed moving from lying on your back to sitting on the side of a flat bed without using bedrails?: A Lot Help needed moving to and from a bed to a chair (including a wheelchair)?: A Lot Help needed standing up from a chair using your arms (e.g., wheelchair or bedside chair)?: A Lot Help needed to walk in hospital room?: A Lot Help needed climbing 3-5 steps with a railing? : A Lot 6 Click Score: 13     End of Session   Activity Tolerance: Patient tolerated treatment well;Patient limited by fatigue;Patient limited by pain Patient left: in chair;with call bell/phone within reach;with chair alarm set Nurse Communication: Mobility status PT Visit Diagnosis: Unsteadiness on feet (R26.81);Other abnormalities of gait and mobility (R26.89);Pain;History of falling (Z91.81);Muscle weakness (generalized) (M62.81);Difficulty in walking, not elsewhere classified (R26.2) Pain - Right/Left: Left Pain - part of body: Hip     Time: 2595-6387 PT Time Calculation (min) (ACUTE ONLY): 22 min  Charges:    $Therapeutic Exercise: 8-22 mins $Therapeutic Activity: 8-22 mins PT General Charges $$ ACUTE PT VISIT: 1 Visit                     3:35 PM, 02/27/24 Ocie Bob, MPT Physical Therapist with [facility] [phone] office [phone] mobile phone

## 2024-02-27 NOTE — Final Consult Note (Signed)
  Postop plan   Weightbearing as tolerated Direct lateral hip precautions DVT prophylaxis for 30 days aspirin 325 mg daily Remove staples at 12 to 14 days Postop appointment scheduled for 28 days

## 2024-02-27 NOTE — Care Management Important Message (Signed)
 Important Message  Patient Details  Name: Tiffany Everett MRN: [id number] Date of Birth: 1941/11/08   Important Message Given:  Yes - Medicare IM     Corey Harold 02/27/2024, 11:25 AM

## 2024-02-27 NOTE — Discharge Summary (Signed)
 Physician Discharge Summary   Patient: Tiffany Everett MRN: [id number] DOB: August 29, 1941  Admit date:     02/24/2024  Discharge date: 02/27/24  Discharge Physician: Vassie Loll   PCP: Roe Rutherford, NP   Recommendations at discharge:  Repeat basic metabolic panel to follow electrolytes and renal function Repeat CBC to follow hemoglobin trend/stability. Reassess blood pressure and adjust antihypertensive treatment as needed.  Discharge Diagnoses: Principal Problem:   Closed displaced fracture of left femoral neck (HCC) Active Problems:   Essential hypertension   Gastroesophageal reflux disease  Brief Hospital admission narrative: Tiffany Everett is a 83 y.o. female with medical history significant of hypertension, depression/anxiety and history of vitamin D deficiency; who presented to the hospital secondary to hip pain and inability to bear weight after fall this morning.  Patient expressed waking up to go to the bathroom when she essentially passed out and ended up falling on her left side.  She experienced significant pain and discomfort from his left hip and was unable to bear weight.   There has not been any fever, dysuria, hematuria, focal weaknesses prior to this event, sick contact, nausea, vomiting, diarrhea, chest pain or shortness of breath.   Workup in the ED demonstrating left neck femur fracture mildly displaced; CT scan of the head without acute intracranial normalities and no cardiopulmonary process appreciated.  Blood work for the most part stable.  Orthopedic service has been consulted and [facility] contacted to place patient in the hospital for further evaluation and management.  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 -Weightbearing as tolerated -Continue as needed analgesics for pain  control -Direct lateral hip precautions recommended. -Follow-up with orthopedic service in 28 days; okay to remove staples in 14 days.   2-hyperlipidemia -Continue statin.   3-history of depression: -Continue Effexor   4-hypertension -Continue home antihypertensive agents and follow vital signs fluctuation -Will adjust medication as needed.  Consultants: Orthopedic service Procedures performed: See below for x-ray reports. Disposition: Skilled nursing facility Diet recommendation: Heart healthy diet.  DISCHARGE MEDICATION: Allergies as of 02/27/2024   No Known Allergies      Medication List     STOP taking these medications    aspirin EC 81 MG tablet       TAKE these medications    apixaban 2.5 MG Tabs tablet Commonly known as: Eliquis Take 1 tablet (2.5 mg total) by mouth 2 (two) times daily.   celecoxib 200 MG capsule Commonly known as: CELEBREX Take 1 capsule (200 mg total) by mouth 2 (two) times daily.   docusate sodium 100 MG capsule Commonly known as: COLACE Take 1 capsule (100 mg total) by mouth 2 (two) times daily.   HYDROcodone-acetaminophen 7.5-325 MG tablet Commonly known as: NORCO Take 1-2 tablets by mouth every 6 (six) hours as needed for severe pain (pain score 7-10) or moderate pain (pain score 4-6).   methocarbamol 500 MG tablet Commonly known as: ROBAXIN Take 1 tablet (500 mg total) by mouth every 8 (eight) hours as needed for muscle spasms.   metoprolol succinate 25 MG 24 hr tablet Commonly known as: TOPROL-XL Take 25 mg by mouth daily.   multivitamin with minerals Tabs tablet Take 1 tablet by mouth daily. Start taking on: February 28, 2024   pantoprazole 40 MG tablet Commonly known as: Protonix Take 1 tablet (40 mg total)  by mouth daily.   polyethylene glycol 17 g packet Commonly known as: MiraLax Take 17 g by mouth daily as needed.   simvastatin 10 MG tablet Commonly known as: ZOCOR Take 1 tablet by mouth daily.    venlafaxine XR 37.5 MG 24 hr capsule Commonly known as: EFFEXOR-XR Take 37.5 mg by mouth daily.   Vitamin D (Ergocalciferol) 1.25 MG (50000 UNIT) Caps capsule Commonly known as: DRISDOL Take 50,000 Units by mouth every 7 (seven) days.        Contact information for follow-up providers     Roe Rutherford, NP. Schedule an appointment as soon as possible for a visit in 10 day(s).   Specialty: Adult Health Nurse Practitioner Why: After discharge from the skilled nursing facility. Contact information: [address] [place] [address] [phone]         Vickki Hearing, MD. Schedule an appointment as soon as possible for a visit in 4 week(s).   Specialties: Orthopedic Surgery, Radiology Contact information: [address] [place] [address] [phone]              Contact information for after-discharge care     Destination     [facility] .   Service: Skilled Nursing Contact information: [address] [place] [address] [phone]                    Discharge Exam: Ceasar Mons Weights   02/24/24 1834 02/25/24 1141  Weight: 71.3 kg 71.3 kg   General exam: Alert, awake, oriented x 3; denying chest pain or shortness of breath.  Tolerated surgical procedure well.  Overall pain is controlled. Respiratory system: No using accessory muscle.  Good saturation on room air. Cardiovascular system:RRR.  No rubs, no gallops, no JVD. Gastrointestinal system: Abdomen is nondistended, soft and nontender. No organomegaly or masses felt. Normal bowel sounds heard. Central nervous system: Alert and oriented. No focal neurological deficits. Extremities: No cyanosis or clubbing. Skin: No petechiae; lateral hip wound clean and dry; dressings in place. Psychiatry: Judgement and insight appear normal. Mood & affect appropriate.   Condition at discharge: Stable and improved.  The results of  significant diagnostics from this hospitalization (including imaging, microbiology, ancillary and laboratory) are listed below for reference.   Imaging Studies: DG Pelvis Portable Result Date: 02/25/2024 CLINICAL DATA:  Status post hip surgery. EXAM: PORTABLE PELVIS 1-2 VIEWS COMPARISON:  Preoperative radiograph FINDINGS: Left hip arthroplasty in expected alignment. No periprosthetic lucency or fracture. Recent postsurgical change includes air and edema in the soft tissues. Lateral skin staples in place. IMPRESSION: Left hip arthroplasty without immediate postoperative complication. Electronically Signed   By: Narda Rutherford M.D.   On: 02/25/2024 16:44   DG Chest 1 View Result Date: 02/24/2024 CLINICAL DATA:  Pain after fall EXAM: CHEST  1 VIEW COMPARISON:  Chest x-ray 02/01/2020. FINDINGS: Calcified aorta. Normal cardiopericardial silhouette. No edema. No consolidation, pneumothorax or effusion. Overlapping cardiac leads. Degenerative changes of the spine. Old lower right rib fractures. Hardware along the left humerus at the area edge of the imaging field. IMPRESSION: No acute cardiopulmonary disease. Electronically Signed   By: Karen Kays M.D.   On: 02/24/2024 10:42   DG HIP UNILAT W OR W/O PELVIS 2-3 VIEWS LEFT Result Date: 02/24/2024 CLINICAL DATA:  Fall and left hip pain. EXAM: DG HIP (WITH OR WITHOUT PELVIS) 2-3V LEFT COMPARISON:  Pelvic radiograph dated 02/01/2020. FINDINGS: There is a mildly  displaced and impacted fracture of the left femoral neck. No dislocation. The bones are osteopenic. The soft tissues are unremarkable. IMPRESSION: Mildly displaced and impacted fracture of the left femoral neck. Electronically Signed   By: Elgie Collard M.D.   On: 02/24/2024 10:41   CT Cervical Spine Wo Contrast Result Date: 02/24/2024 CLINICAL DATA:  83 year old female with syncope wall walking to the bathroom. Struck head on floor. EXAM: CT CERVICAL SPINE WITHOUT CONTRAST TECHNIQUE: Multidetector CT  imaging of the cervical spine was performed without intravenous contrast. Multiplanar CT image reconstructions were also generated. RADIATION DOSE REDUCTION: This exam was performed according to the departmental dose-optimization program which includes automated exposure control, adjustment of the mA and/or kV according to patient size and/or use of iterative reconstruction technique. COMPARISON:  Head CT today.  Cervical spine CT 02/01/2020. FINDINGS: Alignment: Stable since 2021. Chronic straightening of cervical lordosis with mild degenerative appearing anterolisthesis at C7-T1. Stable bilateral posterior element alignment. Skull base and vertebrae: Bone mineralization is within normal limits for age. Visualized skull base is intact. No atlanto-occipital dissociation. C1 and C2 appear intact and aligned. No acute osseous abnormality identified. Soft tissues and spinal canal: No prevertebral fluid or swelling. No visible canal hematoma. Bulky calcified atherosclerosis of the left carotid artery. Faintly visible chronic bulky right submandibular gland sialolith. Disc levels: Advanced cervical spine degeneration superimposed on degenerative C2-C3 facet ankylosis on the right. Severe multilevel right cervical facet arthropathy. Bulky disc and endplate degeneration C4-C5 through C6-C7. And facet arthropathy associated with mild anterolisthesis at the cervicothoracic junction. However, fairly capacious CT appearance of the underlying cervical spinal canal. No strong CT evidence of cervical spinal stenosis. Upper chest: Visible upper thoracic levels appear intact. Chronic apical lung scarring appears stable. IMPRESSION: 1. No acute traumatic injury identified in the cervical spine. 2. Chronically advanced cervical spine degeneration but no strong CT evidence of cervical spinal stenosis. Electronically Signed   By: Odessa Fleming M.D.   On: 02/24/2024 09:29   CT Head Wo Contrast Result Date: 02/24/2024 CLINICAL DATA:   83 year old female with syncope wall walking to the bathroom. Struck head on floor. EXAM: CT HEAD WITHOUT CONTRAST TECHNIQUE: Contiguous axial images were obtained from the base of the skull through the vertex without intravenous contrast. RADIATION DOSE REDUCTION: This exam was performed according to the departmental dose-optimization program which includes automated exposure control, adjustment of the mA and/or kV according to patient size and/or use of iterative reconstruction technique. COMPARISON:  Brain MRI 06/23/2020.  Head CT 02/01/2020. FINDINGS: Brain: Advanced chronic ischemic disease in the bilateral basal ganglia, left thalamus, right MCA anterior or middle division appears stable since 2021. Confluent additional bilateral cerebral white matter hypodensity has not significantly changed. Chronic dystrophic calcifications in the inferior basal ganglia bilaterally. And similar dystrophic appearing calcification in the left cerebellum which is partially stellate and linear (series 5 images 35-37). No regional edema or mass effect. No midline shift, ventriculomegaly, mass effect, evidence of mass lesion, intracranial hemorrhage or evidence of cortically based acute infarction. Vascular: Calcified atherosclerosis at the skull base. No suspicious intracranial vascular hyperdensity. Skull: Intact.  No acute osseous abnormality identified. Sinuses/Orbits: Chronic bilateral paranasal sinus mucoperiosteal thickening does not appear significantly changed. Tympanic cavities and mastoids remain well aerated. Other: No acute orbit or scalp soft tissue injury is identified. IMPRESSION: 1. No acute traumatic injury identified. 2.  No acute intracranial abnormality. 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 Associated chronic dystrophic calcifications in the bilateral  basal ganglia, and similar dystrophic calcifications now in the left deep cerebellar nuclei. 3. Chronic  paranasal sinus disease. Electronically Signed   By: Odessa Fleming M.D.   On: 02/24/2024 09:26    Microbiology: Results for orders placed or performed during the hospital encounter of 02/24/24  Surgical PCR screen     Status: None   Collection Time: 02/24/24  4:50 AM   Specimen: Nasal Mucosa; Nasal Swab  Result Value Ref Range Status   MRSA, PCR NEGATIVE NEGATIVE Final   Staphylococcus aureus NEGATIVE NEGATIVE Final    Comment: (NOTE) The Xpert SA Assay (FDA approved for NASAL specimens in patients 22 years of age and older), is one component of a comprehensive surveillance program. It is not intended to diagnose infection nor to guide or monitor treatment. Performed at [facility], [address], [place], [place] [address]     Labs: CBC: Recent Labs  Lab 02/24/24 0818 02/25/24 0505 02/26/24 0507 02/27/24 0502  WBC 8.8 8.5 10.6* 8.3  HGB 16.4* 15.0 12.8 11.1*  HCT 50.1* 45.8 38.7 33.4*  MCV 89.3 88.6 88.8 87.4  PLT 164 157 159 151   Basic Metabolic Panel: Recent Labs  Lab 02/24/24 0818 02/25/24 0505  NA 139 138  K 3.5 3.8  CL 104 106  CO2 24 24  GLUCOSE 119* 117*  BUN 20 19  CREATININE 0.83 0.84  CALCIUM 9.3 8.8*  MG  --  2.2    Discharge time spent: greater than 30 minutes.  Signed: Vassie Loll, MD [facility] 02/27/2024

## 2024-03-01 ENCOUNTER — Non-Acute Institutional Stay (SKILLED_NURSING_FACILITY): Payer: Self-pay | Admitting: Internal Medicine

## 2024-03-01 ENCOUNTER — Encounter: Payer: Self-pay | Admitting: Internal Medicine

## 2024-03-01 DIAGNOSIS — R9431 Abnormal electrocardiogram [ECG] [EKG]: Secondary | ICD-10-CM | POA: Diagnosis not present

## 2024-03-01 DIAGNOSIS — I10 Essential (primary) hypertension: Secondary | ICD-10-CM

## 2024-03-01 DIAGNOSIS — S72002A Fracture of unspecified part of neck of left femur, initial encounter for closed fracture: Secondary | ICD-10-CM

## 2024-03-01 NOTE — Patient Instructions (Signed)
 See assessment and plan under each diagnosis in the problem list and acutely for this visit

## 2024-03-01 NOTE — Assessment & Plan Note (Signed)
 BP controlled; no change in antihypertensive medications

## 2024-03-01 NOTE — Assessment & Plan Note (Signed)
 02/24/2024 EKG revealed QTc of 496 ms, borderline prolonged QT.  Cardiology monitor should be considered to rule out any dysrhythmia as a factor in her syncope which resulted in hip fracture.

## 2024-03-01 NOTE — Assessment & Plan Note (Signed)
 PT/OT at SNF as tolerated.  Weightbearing as tolerated.  Staples to be removed in 14 days; orthopedic follow-up 28 days.

## 2024-03-01 NOTE — Progress Notes (Unsigned)
 NURSING HOME LOCATION:  [facility] ROOM NUMBER: [id number] P  CODE STATUS: Full code  PCP: Roe Rutherford NP  This is a comprehensive admission note to this SNFperformed on this date less than 30 days from date of admission. Included are preadmission medical/surgical history; reconciled medication list; family history; social history and comprehensive review of systems.  Corrections and additions to the records were documented. Comprehensive physical exam was also performed. Additionally a clinical summary was entered for each active diagnosis pertinent to this admission in the Problem List to enhance continuity of care.  HPI: She was hospitalized 3/18 - 02/27/2024 with a closed displaced fracture of the left femoral neck in the context of apparent syncope.  She stated that she was in the process of getting out of bed to go to the bathroom when she essentially passed out and fell on her left side.  CT of the head revealed no acute intracranial changes. 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 Fuller Canada.  Postoperatively mild normochromic, normocytic anemia developed with H/H of 11.1/33.4. DVT prophylaxis was with Eliquis 2.5 mg twice daily for 30 days post op. Weightbearing was to be as tolerated.  Orthopedic follow-up was to be in 28 days with staple removal in 14 days. While hospitalized mild hyperglycemia was present with glucoses ranging from 117 up to 119. PT/OT recommended SNF placement for rehab.  Past medical and surgical history: Includes dyslipidemia, history of depression, essential hypertension, history of colon polyps, GERD, and extrinsic seasonal rhinitis. Surgeries and procedures include colonoscopy,, appendectomy, and laparotomy.  Family history: reviewed, non contributory due to advanced age.  Social history: Nondrinker; non-smoker.   Review of systems: Clinical neurocognitive deficits made validity of responses questionable ,  compromising ROS completion.  When asked the reason for hospitalization she stated "after they tested my hearing they discovered a rupture."  She then went on to state that hearing therapy was recommended.  She did validate a fall and "not being able to walk."  She stated that she had arisen early in the morning to go to the bathroom when she fell.  She was unable to describe any definite cardiac or neurologic prodrome.  Her son stated that she had a prior syncopal episode several years ago.  They both deny any cardiac workup in relationship to QT interval prolongation.  Her responses was almost universally a negative shake of the head.  Constitutional: No fever, significant weight change, fatigue  Eyes: No redness, discharge, pain, vision change ENT/mouth: No nasal congestion, purulent discharge,  sore throat  Cardiovascular: No chest pain, palpitations, paroxysmal nocturnal dyspnea, claudication, edema  Respiratory: No cough, sputum production, hemoptysis, DOE, significant snoring, apnea Gastrointestinal: No heartburn, dysphagia, abdominal pain, nausea /vomiting, rectal bleeding, melena, change in bowels Genitourinary: No dysuria, hematuria, pyuria, incontinence, nocturia Musculoskeletal: No joint stiffness, joint swelling, weakness, pain Dermatologic: No rash, pruritus, change in appearance of skin Neurologic: No dizziness, headache, seizures, numbness, tingling Psychiatric: No significant anxiety, depression, insomnia, anorexia Endocrine: No change in hair/skin/nails, excessive thirst, excessive hunger, excessive urination  Hematologic/lymphatic: No significant bruising, lymphadenopathy, abnormal bleeding Allergy/immunology: No itchy/watery eyes, significant sneezing, urticaria, angioedema  Physical exam:  Pertinent or positive findings: She appears her age and chronically ill.  Facies tend to be blank.  There is marked delay in word retrieval.  Eyebrows are decreased laterally.  Dental hygiene  is good.  Heart rhythm is slightly irregular.  She does have occasional premature beats.  Pedal pulses are decreased to  palpation.  She has limb atrophy and interosseous wasting.  She has some ecchymoses as well as faint scarring over the shins.  General appearance:  no acute distress, increased work of breathing is present.   Lymphatic: No lymphadenopathy about the head, neck, axilla. Eyes: No conjunctival inflammation or lid edema is present. There is no scleral icterus. Ears:  External ear exam shows no significant lesions or deformities.   Nose:  External nasal examination shows no deformity or inflammation. Nasal mucosa are pink and moist without lesions, exudates Oral exam: Lips and gums are healthy appearing.There is no oropharyngeal erythema or exudate. Neck:  No thyromegaly, masses, tenderness noted.    Heart:  No gallop, murmur, click, rub.  Lungs: Chest clear to auscultation without wheezes, rhonchi, rales, rubs. Abdomen: Bowel sounds are normal.  Abdomen is soft and nontender with no organomegaly, hernias, masses. GU: Deferred  Extremities:  No cyanosis, clubbing, edema. Neurologic exam: Balance, Rhomberg, finger to nose testing could not be completed due to clinical state Skin: Warm & dry w/o tenting. No significant rash.  See clinical summary under each active problem in the Problem List with associated updated therapeutic plan

## 2024-03-02 ENCOUNTER — Encounter: Payer: Self-pay | Admitting: Internal Medicine

## 2024-03-02 DIAGNOSIS — R29818 Other symptoms and signs involving the nervous system: Secondary | ICD-10-CM | POA: Insufficient documentation

## 2024-03-03 ENCOUNTER — Telehealth: Payer: Self-pay | Admitting: Radiology

## 2024-03-03 ENCOUNTER — Telehealth: Payer: Self-pay | Admitting: Orthopedic Surgery

## 2024-03-03 NOTE — Telephone Encounter (Signed)
I called with verbal orders  °

## 2024-03-03 NOTE — Telephone Encounter (Signed)
-----   Message from [place] sent at 03/03/2024  1:57 PM EDT ----- [facility]  Can u call them and give order to remove staples on POD 14

## 2024-03-03 NOTE — Telephone Encounter (Signed)
 Dr. Mort Sawyers pt - spoke w/Jessica at the [facility] [phone], she is requesting an order for them to be able to remove the staples - Remove staples at 12 to 14 days

## 2024-03-03 NOTE — Telephone Encounter (Signed)
 I called to give VO see other open phone message for the number and name

## 2024-03-12 ENCOUNTER — Non-Acute Institutional Stay (SKILLED_NURSING_FACILITY): Payer: Self-pay | Admitting: Adult Health

## 2024-03-12 ENCOUNTER — Encounter: Payer: Self-pay | Admitting: Adult Health

## 2024-03-12 DIAGNOSIS — R4189 Other symptoms and signs involving cognitive functions and awareness: Secondary | ICD-10-CM

## 2024-03-12 DIAGNOSIS — S72002A Fracture of unspecified part of neck of left femur, initial encounter for closed fracture: Secondary | ICD-10-CM

## 2024-03-12 DIAGNOSIS — I10 Essential (primary) hypertension: Secondary | ICD-10-CM

## 2024-03-12 DIAGNOSIS — R29818 Other symptoms and signs involving the nervous system: Secondary | ICD-10-CM | POA: Diagnosis not present

## 2024-03-12 NOTE — Progress Notes (Signed)
 Location:  [facility] Room Number: [id number] Place of Service:  SNF (31)   CODE STATUS: dnr    Not on File  Chief Complaint  Patient presents with   Acute Visit    Care plan meeting     HPI:  We have come together for her care plan meeting. Family present. 33/50 BCAT  BIMS 10/15 mood 3/30: decreased energy. Using wheelchair without further falls. She requires moderate to dependent assist with adls. She is frequently incontinent of bladder and bowel. Dietary: setup for meals; regular diet appetite 76-100%; weight is 161 pounds. Therapy: ambulate 130 with rolling walker at supervision; upper body supervision; lower body contact guard; brp contact guard. . She will continue to be followed for her chronic illnesses including: Essential hypertension  Neurocognitive deficits  Closed displaced fracture of left femoral neck  Past Medical History:  Diagnosis Date   Colon polyps    Hypertension    Seasonal allergies     Past Surgical History:  Procedure Laterality Date   APPENDECTOMY     CATARACT EXTRACTION W/PHACO Right 09/13/2013   Procedure: CATARACT EXTRACTION PHACO AND INTRAOCULAR LENS PLACEMENT (IOC);  Surgeon: Susa Simmonds, MD;  Location: [facility];  Service: Ophthalmology;  Laterality: Right;  CDE:3.25   CATARACT EXTRACTION W/PHACO Left 11/22/2013   Procedure: CATARACT EXTRACTION PHACO AND INTRAOCULAR LENS PLACEMENT (IOC);  Surgeon: Susa Simmonds, MD;  Location: [facility];  Service: Ophthalmology;  Laterality: Left;  CDE: 3.95   CESAREAN SECTION     x2   COLONOSCOPY     SLF 2010: sigmoid diverticula, otherwise wnl. Repeat 5 yrs.   HIP ARTHROPLASTY Left 02/25/2024   Procedure: HEMIARTHROPLASTY (BIPOLAR) HIP;  Surgeon: Vickki Hearing, MD;  Location: [facility];  Service: Orthopedics;  Laterality: Left;   I & D EXTREMITY Left    lateral wrist   LAPAROTOMY     oopherectomy   ORIF HUMERUS FRACTURE Left     Social History   Socioeconomic History   Marital  status: Married    Spouse name: Not on file   Number of children: Not on file   Years of education: Not on file   Highest education level: Not on file  Occupational History   Not on file  Tobacco Use   Smoking status: Never   Smokeless tobacco: Never   Tobacco comments:    Never smoked  Vaping Use   Vaping status: Never Used  Substance and Sexual Activity   Alcohol use: No    Alcohol/week: 0.0 standard drinks of alcohol   Drug use: No   Sexual activity: Not on file  Other Topics Concern   Not on file  Social History Narrative   Not on file   Social Drivers of Health   Financial Resource Strain: Low Risk  (10/31/2023)   Received from [facility]   Overall Financial Resource Strain (CARDIA)    Difficulty of Paying Living Expenses: Not hard at all  Food Insecurity: No Food Insecurity (02/24/2024)   Hunger Vital Sign    Worried About Running Out of Food in the Last Year: Never true    Ran Out of Food in the Last Year: Never true  Transportation Needs: No Transportation Needs (02/24/2024)   PRAPARE - Administrator, Civil Service (Medical): No    Lack of Transportation (Non-Medical): No  Physical Activity: Unknown (10/31/2023)   Received from [facility]   Exercise Vital Sign    Days of  Exercise per Week: 0 days    Minutes of Exercise per Session: Not on file  Stress: No Stress Concern Present (10/31/2023)   Received from [facility] of Occupational Health - Occupational Stress Questionnaire    Feeling of Stress : Not at all  Social Connections: Moderately Integrated (02/24/2024)   Social Connection and Isolation Panel [NHANES]    Frequency of Communication with Friends and Family: Twice a week    Frequency of Social Gatherings with Friends and Family: More than three times a week    Attends Religious Services: 1 to 4 times per year    Active Member of [employer] or Organizations: No    Attends Banker Meetings: 1 to 4 times  per year    Marital Status: Widowed  Intimate Partner Violence: Not At Risk (02/24/2024)   Humiliation, Afraid, Rape, and Kick questionnaire    Fear of Current or Ex-Partner: No    Emotionally Abused: No    Physically Abused: No    Sexually Abused: No   Family History  Problem Relation Age of Onset   Colon cancer Mother 1   Colon cancer Father 64      VITAL SIGNS BP (!) 148/78   Pulse 66   Temp (!) 97.1 F (36.2 C)   Resp 20   Ht 5\' 8"  (1.727 m)   Wt 161 lb (73 kg)   LMP  (LMP Unknown)   SpO2 95%   BMI 24.48 kg/m   Outpatient Encounter Medications as of 03/12/2024  Medication Sig Note   apixaban (ELIQUIS) 2.5 MG TABS tablet Take 1 tablet (2.5 mg total) by mouth 2 (two) times daily.    celecoxib (CELEBREX) 200 MG capsule Take 1 capsule (200 mg total) by mouth 2 (two) times daily.    docusate sodium (COLACE) 100 MG capsule Take 1 capsule (100 mg total) by mouth 2 (two) times daily.    methocarbamol (ROBAXIN) 500 MG tablet Take 1 tablet (500 mg total) by mouth every 8 (eight) hours as needed for muscle spasms.    metoprolol succinate (TOPROL-XL) 25 MG 24 hr tablet Take 25 mg by mouth daily.    Multiple Vitamin (MULTIVITAMIN WITH MINERALS) TABS tablet Take 1 tablet by mouth daily.    pantoprazole (PROTONIX) 40 MG tablet Take 1 tablet (40 mg total) by mouth daily.    polyethylene glycol (MIRALAX) 17 g packet Take 17 g by mouth daily as needed.    simvastatin (ZOCOR) 10 MG tablet Take 1 tablet by mouth daily.    venlafaxine XR (EFFEXOR-XR) 37.5 MG 24 hr capsule Take 37.5 mg by mouth daily.    Vitamin D, Ergocalciferol, (DRISDOL) 1.25 MG (50000 UNIT) CAPS capsule Take 50,000 Units by mouth every 7 (seven) days. 04/03/2022: Saturday    [DISCONTINUED] HYDROcodone-acetaminophen (NORCO) 7.5-325 MG tablet Take 1-2 tablets by mouth every 6 (six) hours as needed for severe pain (pain score 7-10) or moderate pain (pain score 4-6).    No facility-administered encounter medications on file  as of 03/12/2024.     SIGNIFICANT DIAGNOSTIC EXAMS  Review of Systems  Constitutional:  Negative for malaise/fatigue.  Respiratory:  Negative for cough and shortness of breath.   Cardiovascular:  Negative for chest pain, palpitations and leg swelling.  Gastrointestinal:  Negative for abdominal pain, constipation and heartburn.  Musculoskeletal:  Negative for back pain, joint pain and myalgias.  Skin: Negative.   Neurological:  Negative for dizziness.  Psychiatric/Behavioral:  The patient is not nervous/anxious.  Physical Exam Constitutional:      General: She is not in acute distress.    Appearance: She is well-developed. She is not diaphoretic.  Neck:     Thyroid: No thyromegaly.  Cardiovascular:     Rate and Rhythm: Normal rate and regular rhythm.     Pulses: Normal pulses.     Heart sounds: Normal heart sounds.  Pulmonary:     Effort: Pulmonary effort is normal. No respiratory distress.     Breath sounds: Normal breath sounds.  Abdominal:     General: Bowel sounds are normal. There is no distension.     Palpations: Abdomen is soft.     Tenderness: There is no abdominal tenderness.  Musculoskeletal:        General: Normal range of motion.     Cervical back: Neck supple.     Right lower leg: No edema.     Left lower leg: No edema.  Lymphadenopathy:     Cervical: No cervical adenopathy.  Skin:    General: Skin is warm and dry.  Neurological:     Mental Status: She is alert. Mental status is at baseline.  Psychiatric:        Mood and Affect: Mood normal.      ASSESSMENT/ PLAN:  TODAY  Essential hypertension Neurocognitive deficits Closed displaced fracture of left femoral neck  Will continue current medications Will continue therapy as directed Will continue to monitor her status Goals of care: to go home  Time spent with patient: 40 minutes: therapy; goals of care; medications.    Synthia Innocent NP [facility] Adult [facility]   call [phone]

## 2024-03-15 ENCOUNTER — Non-Acute Institutional Stay (SKILLED_NURSING_FACILITY): Payer: Self-pay | Admitting: Adult Health

## 2024-03-15 ENCOUNTER — Other Ambulatory Visit: Payer: Self-pay | Admitting: *Deleted

## 2024-03-15 ENCOUNTER — Encounter: Payer: Self-pay | Admitting: Adult Health

## 2024-03-15 DIAGNOSIS — I10 Essential (primary) hypertension: Secondary | ICD-10-CM

## 2024-03-15 DIAGNOSIS — R29818 Other symptoms and signs involving the nervous system: Secondary | ICD-10-CM | POA: Diagnosis not present

## 2024-03-15 DIAGNOSIS — R262 Difficulty in walking, not elsewhere classified: Secondary | ICD-10-CM | POA: Diagnosis not present

## 2024-03-15 DIAGNOSIS — S72002A Fracture of unspecified part of neck of left femur, initial encounter for closed fracture: Secondary | ICD-10-CM

## 2024-03-15 DIAGNOSIS — R4189 Other symptoms and signs involving cognitive functions and awareness: Secondary | ICD-10-CM

## 2024-03-15 DIAGNOSIS — M6281 Muscle weakness (generalized): Secondary | ICD-10-CM | POA: Diagnosis not present

## 2024-03-15 DIAGNOSIS — S72002D Fracture of unspecified part of neck of left femur, subsequent encounter for closed fracture with routine healing: Secondary | ICD-10-CM | POA: Diagnosis not present

## 2024-03-15 NOTE — Patient Outreach (Addendum)
 Post-Acute Care Manager follow up. Mrs. Kingdon resides in [facility]. Screening for potential complex care management needs.   Update received from [facility] social worker. Mrs. Calvillo will return home with 2 sons and home health services with [facility]. Lynnea Ferrier reports additional care management services not needed.     Raiford Noble, MSN, RN, BSN [place]  [facility] RN Post- Acute Care Manager Direct Dial: [phone]

## 2024-03-15 NOTE — Progress Notes (Unsigned)
 Location:  [facility] Room Number: 152 Place of Service:  SNF (31)   CODE STATUS: full code    Chief Complaint  Patient presents with   Discharge Note   HPI:  She is being discharged to home health for pt/ot. She will need a rolling walker. She will need her prescriptions written and will need to follow up with her medical provider. She had been hospitalized for a left hip fracture. She was admitted to this facility for short term rehab: therapy: ambulate 200 feet with a rolling walker and contact guard assist; upper body supervision; lower body min assist; stand/pivot contact guard; brp: contact guard.   Past Medical History:  Diagnosis Date   Colon polyps    Hypertension    Seasonal allergies     Past Surgical History:  Procedure Laterality Date   APPENDECTOMY     CATARACT EXTRACTION W/PHACO Right 09/13/2013   Procedure: CATARACT EXTRACTION PHACO AND INTRAOCULAR LENS PLACEMENT (IOC);  Surgeon: Susa Simmonds, MD;  Location: [facility];  Service: Ophthalmology;  Laterality: Right;  CDE:3.25   CATARACT EXTRACTION W/PHACO Left 11/22/2013   Procedure: CATARACT EXTRACTION PHACO AND INTRAOCULAR LENS PLACEMENT (IOC);  Surgeon: Susa Simmonds, MD;  Location: [facility];  Service: Ophthalmology;  Laterality: Left;  CDE: 3.95   CESAREAN SECTION     x2   COLONOSCOPY     SLF 2010: sigmoid diverticula, otherwise wnl. Repeat 5 yrs.   HIP ARTHROPLASTY Left 02/25/2024   Procedure: HEMIARTHROPLASTY (BIPOLAR) HIP;  Surgeon: Vickki Hearing, MD;  Location: [facility];  Service: Orthopedics;  Laterality: Left;   I & D EXTREMITY Left    lateral wrist   LAPAROTOMY     oopherectomy   ORIF HUMERUS FRACTURE Left     Social History   Socioeconomic History   Marital status: Married    Spouse name: Not on file   Number of children: Not on file   Years of education: Not on file   Highest education level: Not on file  Occupational History   Not on file  Tobacco Use    Smoking status: Never   Smokeless tobacco: Never   Tobacco comments:    Never smoked  Vaping Use   Vaping status: Never Used  Substance and Sexual Activity   Alcohol use: No    Alcohol/week: 0.0 standard drinks of alcohol   Drug use: No   Sexual activity: Not on file  Other Topics Concern   Not on file  Social History Narrative   Not on file   Social Drivers of Health   Financial Resource Strain: Low Risk  (10/31/2023)   Received from [facility]   Overall Financial Resource Strain (CARDIA)    Difficulty of Paying Living Expenses: Not hard at all  Food Insecurity: No Food Insecurity (02/24/2024)   Hunger Vital Sign    Worried About Running Out of Food in the Last Year: Never true    Ran Out of Food in the Last Year: Never true  Transportation Needs: No Transportation Needs (02/24/2024)   PRAPARE - Administrator, Civil Service (Medical): No    Lack of Transportation (Non-Medical): No  Physical Activity: Unknown (10/31/2023)   Received from [facility]   Exercise Vital Sign    Days of Exercise per Week: 0 days    Minutes of Exercise per Session: Not on file  Stress: No Stress Concern Present (10/31/2023)   Received from [facility]  [employer] of Occupational Health - Occupational Stress Questionnaire    Feeling of Stress : Not at all  Social Connections: Moderately Integrated (02/24/2024)   Social Connection and Isolation Panel [NHANES]    Frequency of Communication with Friends and Family: Twice a week    Frequency of Social Gatherings with Friends and Family: More than three times a week    Attends Religious Services: 1 to 4 times per year    Active Member of [employer] or Organizations: No    Attends Banker Meetings: 1 to 4 times per year    Marital Status: Widowed  Intimate Partner Violence: Not At Risk (02/24/2024)   Humiliation, Afraid, Rape, and Kick questionnaire    Fear of Current or Ex-Partner: No    Emotionally Abused: No     Physically Abused: No    Sexually Abused: No   Family History  Problem Relation Age of Onset   Colon cancer Mother 31   Colon cancer Father 46      VITAL SIGNS BP (!) 149/78   Pulse 80   Temp 97.6 F (36.4 C)   Resp 18   Ht 5\' 8"  (1.727 m)   Wt 159 lb 9.6 oz (72.4 kg)   LMP  (LMP Unknown)   SpO2 97%   BMI 24.27 kg/m   Outpatient Encounter Medications as of 03/15/2024  Medication Sig Note   apixaban (ELIQUIS) 2.5 MG TABS tablet Take 1 tablet (2.5 mg total) by mouth 2 (two) times daily.    celecoxib (CELEBREX) 200 MG capsule Take 1 capsule (200 mg total) by mouth 2 (two) times daily.    docusate sodium (COLACE) 100 MG capsule Take 1 capsule (100 mg total) by mouth 2 (two) times daily.    methocarbamol (ROBAXIN) 500 MG tablet Take 1 tablet (500 mg total) by mouth every 8 (eight) hours as needed for muscle spasms.    metoprolol succinate (TOPROL-XL) 25 MG 24 hr tablet Take 25 mg by mouth daily.    Multiple Vitamin (MULTIVITAMIN WITH MINERALS) TABS tablet Take 1 tablet by mouth daily.    pantoprazole (PROTONIX) 40 MG tablet Take 1 tablet (40 mg total) by mouth daily.    polyethylene glycol (MIRALAX) 17 g packet Take 17 g by mouth daily as needed.    simvastatin (ZOCOR) 10 MG tablet Take 1 tablet by mouth daily.    venlafaxine XR (EFFEXOR-XR) 37.5 MG 24 hr capsule Take 37.5 mg by mouth daily.    Vitamin D, Ergocalciferol, (DRISDOL) 1.25 MG (50000 UNIT) CAPS capsule Take 50,000 Units by mouth every 7 (seven) days. 04/03/2022: Saturday    No facility-administered encounter medications on file as of 03/15/2024.     SIGNIFICANT DIAGNOSTIC EXAMS  Review of Systems  Constitutional:  Negative for malaise/fatigue.  Respiratory:  Negative for cough and shortness of breath.   Cardiovascular:  Negative for chest pain, palpitations and leg swelling.  Gastrointestinal:  Negative for abdominal pain, constipation and heartburn.  Musculoskeletal:  Negative for back pain, joint pain and  myalgias.  Skin: Negative.   Neurological:  Negative for dizziness.  Psychiatric/Behavioral:  The patient is not nervous/anxious.    Physical Exam Constitutional:      General: She is not in acute distress.    Appearance: She is well-developed. She is not diaphoretic.  Neck:     Thyroid: No thyromegaly.  Cardiovascular:     Rate and Rhythm: Normal rate and regular rhythm.     Pulses: Normal pulses.  Heart sounds: Normal heart sounds.  Pulmonary:     Effort: Pulmonary effort is normal. No respiratory distress.     Breath sounds: Normal breath sounds.  Abdominal:     General: Bowel sounds are normal. There is no distension.     Palpations: Abdomen is soft.     Tenderness: There is no abdominal tenderness.  Musculoskeletal:        General: Normal range of motion.     Cervical back: Neck supple.     Right lower leg: No edema.     Left lower leg: No edema.  Lymphadenopathy:     Cervical: No cervical adenopathy.  Skin:    General: Skin is warm and dry.  Neurological:     Mental Status: She is alert. Mental status is at baseline.  Psychiatric:        Mood and Affect: Mood normal.      ASSESSMENT/ PLAN:  Patient is being discharged with the following home health services: pt/ot to evaluate and treat as indicated for gait balance strength and adl training.    Patient is being discharged with the following durable medical equipment: rolling walker    Patient has been advised to f/u with their PCP in 1-2 weeks to for a transitions of care visit.  Social services at their facility was responsible for arranging this appointment.  Pt was provided with adequate prescriptions of noncontrolled medications to reach the scheduled appointment .  For controlled substances, a limited supply was provided as appropriate for the individual patient.  If the pt normally receives these medications from a pain clinic or has a contract with another physician, these medications should be received  from that clinic or physician only).    A 30 day supply of her prescription medications have been sent to [facility]  Time spent with patient: 04 minutes:  therapy; home health; dme.    Synthia Innocent NP [facility] Adult Medicine  call [phone]

## 2024-03-18 DIAGNOSIS — M6281 Muscle weakness (generalized): Secondary | ICD-10-CM | POA: Diagnosis not present

## 2024-03-18 DIAGNOSIS — D6489 Other specified anemias: Secondary | ICD-10-CM | POA: Diagnosis not present

## 2024-03-18 DIAGNOSIS — S72002D Fracture of unspecified part of neck of left femur, subsequent encounter for closed fracture with routine healing: Secondary | ICD-10-CM | POA: Diagnosis not present

## 2024-03-18 DIAGNOSIS — Z471 Aftercare following joint replacement surgery: Secondary | ICD-10-CM | POA: Diagnosis not present

## 2024-03-18 DIAGNOSIS — F32A Depression, unspecified: Secondary | ICD-10-CM | POA: Diagnosis not present

## 2024-03-18 DIAGNOSIS — Z96642 Presence of left artificial hip joint: Secondary | ICD-10-CM | POA: Diagnosis not present

## 2024-03-18 DIAGNOSIS — I4581 Long QT syndrome: Secondary | ICD-10-CM | POA: Diagnosis not present

## 2024-03-18 DIAGNOSIS — Z9181 History of falling: Secondary | ICD-10-CM | POA: Diagnosis not present

## 2024-03-18 DIAGNOSIS — R55 Syncope and collapse: Secondary | ICD-10-CM | POA: Diagnosis not present

## 2024-03-18 DIAGNOSIS — I10 Essential (primary) hypertension: Secondary | ICD-10-CM | POA: Diagnosis not present

## 2024-03-18 DIAGNOSIS — Z7901 Long term (current) use of anticoagulants: Secondary | ICD-10-CM | POA: Diagnosis not present

## 2024-03-23 DIAGNOSIS — D6489 Other specified anemias: Secondary | ICD-10-CM | POA: Diagnosis not present

## 2024-03-23 DIAGNOSIS — Z471 Aftercare following joint replacement surgery: Secondary | ICD-10-CM | POA: Diagnosis not present

## 2024-03-23 DIAGNOSIS — R55 Syncope and collapse: Secondary | ICD-10-CM | POA: Diagnosis not present

## 2024-03-23 DIAGNOSIS — Z7901 Long term (current) use of anticoagulants: Secondary | ICD-10-CM | POA: Diagnosis not present

## 2024-03-23 DIAGNOSIS — I4581 Long QT syndrome: Secondary | ICD-10-CM | POA: Diagnosis not present

## 2024-03-23 DIAGNOSIS — Z9181 History of falling: Secondary | ICD-10-CM | POA: Diagnosis not present

## 2024-03-23 DIAGNOSIS — F32A Depression, unspecified: Secondary | ICD-10-CM | POA: Diagnosis not present

## 2024-03-23 DIAGNOSIS — S72002D Fracture of unspecified part of neck of left femur, subsequent encounter for closed fracture with routine healing: Secondary | ICD-10-CM | POA: Diagnosis not present

## 2024-03-23 DIAGNOSIS — M6281 Muscle weakness (generalized): Secondary | ICD-10-CM | POA: Diagnosis not present

## 2024-03-23 DIAGNOSIS — Z96642 Presence of left artificial hip joint: Secondary | ICD-10-CM | POA: Diagnosis not present

## 2024-03-23 DIAGNOSIS — I10 Essential (primary) hypertension: Secondary | ICD-10-CM | POA: Diagnosis not present

## 2024-03-24 DIAGNOSIS — Z7901 Long term (current) use of anticoagulants: Secondary | ICD-10-CM | POA: Diagnosis not present

## 2024-03-24 DIAGNOSIS — D6489 Other specified anemias: Secondary | ICD-10-CM | POA: Diagnosis not present

## 2024-03-24 DIAGNOSIS — I4581 Long QT syndrome: Secondary | ICD-10-CM | POA: Diagnosis not present

## 2024-03-24 DIAGNOSIS — Z96642 Presence of left artificial hip joint: Secondary | ICD-10-CM | POA: Diagnosis not present

## 2024-03-24 DIAGNOSIS — M6281 Muscle weakness (generalized): Secondary | ICD-10-CM | POA: Diagnosis not present

## 2024-03-24 DIAGNOSIS — R55 Syncope and collapse: Secondary | ICD-10-CM | POA: Diagnosis not present

## 2024-03-24 DIAGNOSIS — I10 Essential (primary) hypertension: Secondary | ICD-10-CM | POA: Diagnosis not present

## 2024-03-24 DIAGNOSIS — Z471 Aftercare following joint replacement surgery: Secondary | ICD-10-CM | POA: Diagnosis not present

## 2024-03-24 DIAGNOSIS — S72002D Fracture of unspecified part of neck of left femur, subsequent encounter for closed fracture with routine healing: Secondary | ICD-10-CM | POA: Diagnosis not present

## 2024-03-24 DIAGNOSIS — F32A Depression, unspecified: Secondary | ICD-10-CM | POA: Diagnosis not present

## 2024-03-24 DIAGNOSIS — Z9181 History of falling: Secondary | ICD-10-CM | POA: Diagnosis not present

## 2024-03-25 ENCOUNTER — Encounter: Payer: Self-pay | Admitting: Orthopedic Surgery

## 2024-03-25 ENCOUNTER — Telehealth: Payer: Self-pay | Admitting: Orthopedic Surgery

## 2024-03-25 ENCOUNTER — Ambulatory Visit (INDEPENDENT_AMBULATORY_CARE_PROVIDER_SITE_OTHER): Admitting: Orthopedic Surgery

## 2024-03-25 DIAGNOSIS — F32A Depression, unspecified: Secondary | ICD-10-CM | POA: Diagnosis not present

## 2024-03-25 DIAGNOSIS — I10 Essential (primary) hypertension: Secondary | ICD-10-CM | POA: Diagnosis not present

## 2024-03-25 DIAGNOSIS — S72002D Fracture of unspecified part of neck of left femur, subsequent encounter for closed fracture with routine healing: Secondary | ICD-10-CM | POA: Diagnosis not present

## 2024-03-25 DIAGNOSIS — R55 Syncope and collapse: Secondary | ICD-10-CM | POA: Diagnosis not present

## 2024-03-25 DIAGNOSIS — D6489 Other specified anemias: Secondary | ICD-10-CM | POA: Diagnosis not present

## 2024-03-25 DIAGNOSIS — Z471 Aftercare following joint replacement surgery: Secondary | ICD-10-CM | POA: Diagnosis not present

## 2024-03-25 DIAGNOSIS — M6281 Muscle weakness (generalized): Secondary | ICD-10-CM | POA: Diagnosis not present

## 2024-03-25 DIAGNOSIS — Z9181 History of falling: Secondary | ICD-10-CM | POA: Diagnosis not present

## 2024-03-25 DIAGNOSIS — I4581 Long QT syndrome: Secondary | ICD-10-CM | POA: Diagnosis not present

## 2024-03-25 DIAGNOSIS — Z96642 Presence of left artificial hip joint: Secondary | ICD-10-CM | POA: Diagnosis not present

## 2024-03-25 DIAGNOSIS — Z7901 Long term (current) use of anticoagulants: Secondary | ICD-10-CM | POA: Diagnosis not present

## 2024-03-25 DIAGNOSIS — S72002A Fracture of unspecified part of neck of left femur, initial encounter for closed fracture: Secondary | ICD-10-CM

## 2024-03-25 NOTE — Progress Notes (Signed)
 Chief Complaint  Patient presents with   Post-op Follow-up    Encounter Diagnosis  Name Primary?   Closed displaced fracture of left femoral neck (HCC) 02/25/24 bipolar replacement Yes    Tiffany Everett continues to improve after left femoral neck fracture.  She is status post bipolar hip replacement on the 19th she is on Eliquis for DVT prevention  She has good flexion of her hip.  Her pain is minimal.  No peripheral edema  She can continue therapy weight-bear as tolerated anterior hip precautions avoid abduction actively  Do not externally rotate or extend the hip past neutral  Patient will return in about 4 weeks for recheck

## 2024-03-25 NOTE — Telephone Encounter (Signed)
 She is here now, I put on the instructions for her.  I will call Moira Andrews as well

## 2024-03-25 NOTE — Patient Instructions (Signed)
 ANTERIOR APPROACH HIP PRECAUTIONS  FULL WEIGHT BEARING  DO NOT EXTEND THE HIP OR EXTERNALLY ROTATE THE HIP

## 2024-03-25 NOTE — Telephone Encounter (Signed)
 I called to advise, and she voiced understanding.

## 2024-03-25 NOTE — Telephone Encounter (Signed)
 Dr. Delfino Fellers pt - spoke w/Michelle w/[facility] [phone], she is needing clarifications on hip precautions.

## 2024-03-25 NOTE — Progress Notes (Signed)
   LMP  (LMP Unknown)   There is no height or weight on file to calculate BMI.  Chief Complaint  Patient presents with   Post-op Follow-up    Encounter Diagnosis  Name Primary?   Closed displaced fracture of left femoral neck (HCC) 02/25/24 bipolar replacement Yes    DOI/DOS/ Date: 02/25/24  Improved

## 2024-03-29 DIAGNOSIS — M6281 Muscle weakness (generalized): Secondary | ICD-10-CM | POA: Diagnosis not present

## 2024-03-29 DIAGNOSIS — Z9181 History of falling: Secondary | ICD-10-CM | POA: Diagnosis not present

## 2024-03-29 DIAGNOSIS — F32A Depression, unspecified: Secondary | ICD-10-CM | POA: Diagnosis not present

## 2024-03-29 DIAGNOSIS — I10 Essential (primary) hypertension: Secondary | ICD-10-CM | POA: Diagnosis not present

## 2024-03-29 DIAGNOSIS — Z7901 Long term (current) use of anticoagulants: Secondary | ICD-10-CM | POA: Diagnosis not present

## 2024-03-29 DIAGNOSIS — Z471 Aftercare following joint replacement surgery: Secondary | ICD-10-CM | POA: Diagnosis not present

## 2024-03-29 DIAGNOSIS — Z96642 Presence of left artificial hip joint: Secondary | ICD-10-CM | POA: Diagnosis not present

## 2024-03-29 DIAGNOSIS — D6489 Other specified anemias: Secondary | ICD-10-CM | POA: Diagnosis not present

## 2024-03-29 DIAGNOSIS — I4581 Long QT syndrome: Secondary | ICD-10-CM | POA: Diagnosis not present

## 2024-03-29 DIAGNOSIS — R55 Syncope and collapse: Secondary | ICD-10-CM | POA: Diagnosis not present

## 2024-03-29 DIAGNOSIS — S72002D Fracture of unspecified part of neck of left femur, subsequent encounter for closed fracture with routine healing: Secondary | ICD-10-CM | POA: Diagnosis not present

## 2024-03-30 DIAGNOSIS — S72002D Fracture of unspecified part of neck of left femur, subsequent encounter for closed fracture with routine healing: Secondary | ICD-10-CM | POA: Diagnosis not present

## 2024-03-30 DIAGNOSIS — I499 Cardiac arrhythmia, unspecified: Secondary | ICD-10-CM | POA: Diagnosis not present

## 2024-03-30 DIAGNOSIS — R55 Syncope and collapse: Secondary | ICD-10-CM | POA: Diagnosis not present

## 2024-03-30 DIAGNOSIS — N1831 Chronic kidney disease, stage 3a: Secondary | ICD-10-CM | POA: Diagnosis not present

## 2024-03-30 DIAGNOSIS — S72002S Fracture of unspecified part of neck of left femur, sequela: Secondary | ICD-10-CM | POA: Diagnosis not present

## 2024-03-31 DIAGNOSIS — Z7901 Long term (current) use of anticoagulants: Secondary | ICD-10-CM | POA: Diagnosis not present

## 2024-03-31 DIAGNOSIS — Z96642 Presence of left artificial hip joint: Secondary | ICD-10-CM | POA: Diagnosis not present

## 2024-03-31 DIAGNOSIS — I10 Essential (primary) hypertension: Secondary | ICD-10-CM | POA: Diagnosis not present

## 2024-03-31 DIAGNOSIS — R55 Syncope and collapse: Secondary | ICD-10-CM | POA: Diagnosis not present

## 2024-03-31 DIAGNOSIS — D6489 Other specified anemias: Secondary | ICD-10-CM | POA: Diagnosis not present

## 2024-03-31 DIAGNOSIS — F32A Depression, unspecified: Secondary | ICD-10-CM | POA: Diagnosis not present

## 2024-03-31 DIAGNOSIS — I4581 Long QT syndrome: Secondary | ICD-10-CM | POA: Diagnosis not present

## 2024-03-31 DIAGNOSIS — S72002D Fracture of unspecified part of neck of left femur, subsequent encounter for closed fracture with routine healing: Secondary | ICD-10-CM | POA: Diagnosis not present

## 2024-03-31 DIAGNOSIS — Z9181 History of falling: Secondary | ICD-10-CM | POA: Diagnosis not present

## 2024-03-31 DIAGNOSIS — Z471 Aftercare following joint replacement surgery: Secondary | ICD-10-CM | POA: Diagnosis not present

## 2024-03-31 DIAGNOSIS — M6281 Muscle weakness (generalized): Secondary | ICD-10-CM | POA: Diagnosis not present

## 2024-04-02 DIAGNOSIS — I499 Cardiac arrhythmia, unspecified: Secondary | ICD-10-CM | POA: Diagnosis not present

## 2024-04-02 DIAGNOSIS — R55 Syncope and collapse: Secondary | ICD-10-CM | POA: Diagnosis not present

## 2024-04-05 DIAGNOSIS — Z96642 Presence of left artificial hip joint: Secondary | ICD-10-CM | POA: Diagnosis not present

## 2024-04-05 DIAGNOSIS — I10 Essential (primary) hypertension: Secondary | ICD-10-CM | POA: Diagnosis not present

## 2024-04-05 DIAGNOSIS — S72002D Fracture of unspecified part of neck of left femur, subsequent encounter for closed fracture with routine healing: Secondary | ICD-10-CM | POA: Diagnosis not present

## 2024-04-05 DIAGNOSIS — I4581 Long QT syndrome: Secondary | ICD-10-CM | POA: Diagnosis not present

## 2024-04-05 DIAGNOSIS — F32A Depression, unspecified: Secondary | ICD-10-CM | POA: Diagnosis not present

## 2024-04-05 DIAGNOSIS — Z9181 History of falling: Secondary | ICD-10-CM | POA: Diagnosis not present

## 2024-04-05 DIAGNOSIS — D6489 Other specified anemias: Secondary | ICD-10-CM | POA: Diagnosis not present

## 2024-04-05 DIAGNOSIS — M6281 Muscle weakness (generalized): Secondary | ICD-10-CM | POA: Diagnosis not present

## 2024-04-05 DIAGNOSIS — R55 Syncope and collapse: Secondary | ICD-10-CM | POA: Diagnosis not present

## 2024-04-05 DIAGNOSIS — Z471 Aftercare following joint replacement surgery: Secondary | ICD-10-CM | POA: Diagnosis not present

## 2024-04-05 DIAGNOSIS — Z7901 Long term (current) use of anticoagulants: Secondary | ICD-10-CM | POA: Diagnosis not present

## 2024-04-07 DIAGNOSIS — I4581 Long QT syndrome: Secondary | ICD-10-CM | POA: Diagnosis not present

## 2024-04-07 DIAGNOSIS — Z96642 Presence of left artificial hip joint: Secondary | ICD-10-CM | POA: Diagnosis not present

## 2024-04-07 DIAGNOSIS — Z9181 History of falling: Secondary | ICD-10-CM | POA: Diagnosis not present

## 2024-04-07 DIAGNOSIS — M6281 Muscle weakness (generalized): Secondary | ICD-10-CM | POA: Diagnosis not present

## 2024-04-07 DIAGNOSIS — Z471 Aftercare following joint replacement surgery: Secondary | ICD-10-CM | POA: Diagnosis not present

## 2024-04-07 DIAGNOSIS — F32A Depression, unspecified: Secondary | ICD-10-CM | POA: Diagnosis not present

## 2024-04-07 DIAGNOSIS — D6489 Other specified anemias: Secondary | ICD-10-CM | POA: Diagnosis not present

## 2024-04-07 DIAGNOSIS — Z7901 Long term (current) use of anticoagulants: Secondary | ICD-10-CM | POA: Diagnosis not present

## 2024-04-07 DIAGNOSIS — S72002D Fracture of unspecified part of neck of left femur, subsequent encounter for closed fracture with routine healing: Secondary | ICD-10-CM | POA: Diagnosis not present

## 2024-04-07 DIAGNOSIS — I10 Essential (primary) hypertension: Secondary | ICD-10-CM | POA: Diagnosis not present

## 2024-04-07 DIAGNOSIS — R55 Syncope and collapse: Secondary | ICD-10-CM | POA: Diagnosis not present

## 2024-04-08 DIAGNOSIS — I4581 Long QT syndrome: Secondary | ICD-10-CM | POA: Diagnosis not present

## 2024-04-08 DIAGNOSIS — M6281 Muscle weakness (generalized): Secondary | ICD-10-CM | POA: Diagnosis not present

## 2024-04-08 DIAGNOSIS — Z9181 History of falling: Secondary | ICD-10-CM | POA: Diagnosis not present

## 2024-04-08 DIAGNOSIS — Z471 Aftercare following joint replacement surgery: Secondary | ICD-10-CM | POA: Diagnosis not present

## 2024-04-08 DIAGNOSIS — I10 Essential (primary) hypertension: Secondary | ICD-10-CM | POA: Diagnosis not present

## 2024-04-08 DIAGNOSIS — F32A Depression, unspecified: Secondary | ICD-10-CM | POA: Diagnosis not present

## 2024-04-08 DIAGNOSIS — S72002D Fracture of unspecified part of neck of left femur, subsequent encounter for closed fracture with routine healing: Secondary | ICD-10-CM | POA: Diagnosis not present

## 2024-04-08 DIAGNOSIS — R55 Syncope and collapse: Secondary | ICD-10-CM | POA: Diagnosis not present

## 2024-04-08 DIAGNOSIS — D6489 Other specified anemias: Secondary | ICD-10-CM | POA: Diagnosis not present

## 2024-04-08 DIAGNOSIS — Z7901 Long term (current) use of anticoagulants: Secondary | ICD-10-CM | POA: Diagnosis not present

## 2024-04-08 DIAGNOSIS — Z96642 Presence of left artificial hip joint: Secondary | ICD-10-CM | POA: Diagnosis not present

## 2024-04-09 DIAGNOSIS — R55 Syncope and collapse: Secondary | ICD-10-CM | POA: Diagnosis not present

## 2024-04-09 DIAGNOSIS — I499 Cardiac arrhythmia, unspecified: Secondary | ICD-10-CM | POA: Diagnosis not present

## 2024-04-12 DIAGNOSIS — I4581 Long QT syndrome: Secondary | ICD-10-CM | POA: Diagnosis not present

## 2024-04-12 DIAGNOSIS — Z7901 Long term (current) use of anticoagulants: Secondary | ICD-10-CM | POA: Diagnosis not present

## 2024-04-12 DIAGNOSIS — M6281 Muscle weakness (generalized): Secondary | ICD-10-CM | POA: Diagnosis not present

## 2024-04-12 DIAGNOSIS — Z96642 Presence of left artificial hip joint: Secondary | ICD-10-CM | POA: Diagnosis not present

## 2024-04-12 DIAGNOSIS — R55 Syncope and collapse: Secondary | ICD-10-CM | POA: Diagnosis not present

## 2024-04-12 DIAGNOSIS — I10 Essential (primary) hypertension: Secondary | ICD-10-CM | POA: Diagnosis not present

## 2024-04-12 DIAGNOSIS — D6489 Other specified anemias: Secondary | ICD-10-CM | POA: Diagnosis not present

## 2024-04-12 DIAGNOSIS — S72002D Fracture of unspecified part of neck of left femur, subsequent encounter for closed fracture with routine healing: Secondary | ICD-10-CM | POA: Diagnosis not present

## 2024-04-12 DIAGNOSIS — Z9181 History of falling: Secondary | ICD-10-CM | POA: Diagnosis not present

## 2024-04-12 DIAGNOSIS — Z471 Aftercare following joint replacement surgery: Secondary | ICD-10-CM | POA: Diagnosis not present

## 2024-04-12 DIAGNOSIS — F32A Depression, unspecified: Secondary | ICD-10-CM | POA: Diagnosis not present

## 2024-04-13 ENCOUNTER — Telehealth: Payer: Self-pay | Admitting: Orthopedic Surgery

## 2024-04-13 DIAGNOSIS — D6489 Other specified anemias: Secondary | ICD-10-CM | POA: Diagnosis not present

## 2024-04-13 DIAGNOSIS — Z7901 Long term (current) use of anticoagulants: Secondary | ICD-10-CM | POA: Diagnosis not present

## 2024-04-13 DIAGNOSIS — S72002D Fracture of unspecified part of neck of left femur, subsequent encounter for closed fracture with routine healing: Secondary | ICD-10-CM | POA: Diagnosis not present

## 2024-04-13 DIAGNOSIS — I10 Essential (primary) hypertension: Secondary | ICD-10-CM | POA: Diagnosis not present

## 2024-04-13 DIAGNOSIS — F32A Depression, unspecified: Secondary | ICD-10-CM | POA: Diagnosis not present

## 2024-04-13 DIAGNOSIS — S72002A Fracture of unspecified part of neck of left femur, initial encounter for closed fracture: Secondary | ICD-10-CM

## 2024-04-13 DIAGNOSIS — R55 Syncope and collapse: Secondary | ICD-10-CM | POA: Diagnosis not present

## 2024-04-13 DIAGNOSIS — Z96642 Presence of left artificial hip joint: Secondary | ICD-10-CM | POA: Diagnosis not present

## 2024-04-13 DIAGNOSIS — M6281 Muscle weakness (generalized): Secondary | ICD-10-CM | POA: Diagnosis not present

## 2024-04-13 DIAGNOSIS — Z471 Aftercare following joint replacement surgery: Secondary | ICD-10-CM | POA: Diagnosis not present

## 2024-04-13 DIAGNOSIS — I4581 Long QT syndrome: Secondary | ICD-10-CM | POA: Diagnosis not present

## 2024-04-13 DIAGNOSIS — Z9181 History of falling: Secondary | ICD-10-CM | POA: Diagnosis not present

## 2024-04-13 NOTE — Telephone Encounter (Signed)
 Order sent ok per Dr Phyllis Breeze thanks

## 2024-04-13 NOTE — Telephone Encounter (Signed)
 Dr. Delfino Fellers pt - Tiffany Everett Listen PT w/[facility] [phone] lvm stating that she has been seeing this pt and she is ready to go to OP PT.  She is requesting an order for the pt to go to OP PT w/[facility] on [address].  They are discharging the patient next week.

## 2024-04-21 ENCOUNTER — Encounter (HOSPITAL_COMMUNITY): Payer: Self-pay

## 2024-04-21 ENCOUNTER — Other Ambulatory Visit: Payer: Self-pay

## 2024-04-21 ENCOUNTER — Ambulatory Visit (HOSPITAL_COMMUNITY): Attending: Orthopedic Surgery

## 2024-04-21 DIAGNOSIS — R55 Syncope and collapse: Secondary | ICD-10-CM | POA: Diagnosis not present

## 2024-04-21 DIAGNOSIS — Z471 Aftercare following joint replacement surgery: Secondary | ICD-10-CM | POA: Diagnosis not present

## 2024-04-21 DIAGNOSIS — Z96642 Presence of left artificial hip joint: Secondary | ICD-10-CM | POA: Diagnosis not present

## 2024-04-21 DIAGNOSIS — F32A Depression, unspecified: Secondary | ICD-10-CM | POA: Diagnosis not present

## 2024-04-21 DIAGNOSIS — S72002A Fracture of unspecified part of neck of left femur, initial encounter for closed fracture: Secondary | ICD-10-CM | POA: Insufficient documentation

## 2024-04-21 DIAGNOSIS — Z7901 Long term (current) use of anticoagulants: Secondary | ICD-10-CM | POA: Diagnosis not present

## 2024-04-21 DIAGNOSIS — I4581 Long QT syndrome: Secondary | ICD-10-CM | POA: Diagnosis not present

## 2024-04-21 DIAGNOSIS — I10 Essential (primary) hypertension: Secondary | ICD-10-CM | POA: Diagnosis not present

## 2024-04-21 DIAGNOSIS — M6281 Muscle weakness (generalized): Secondary | ICD-10-CM | POA: Diagnosis not present

## 2024-04-21 DIAGNOSIS — M25552 Pain in left hip: Secondary | ICD-10-CM | POA: Diagnosis not present

## 2024-04-21 DIAGNOSIS — Z7409 Other reduced mobility: Secondary | ICD-10-CM | POA: Diagnosis not present

## 2024-04-21 DIAGNOSIS — Z9181 History of falling: Secondary | ICD-10-CM | POA: Diagnosis not present

## 2024-04-21 DIAGNOSIS — D6489 Other specified anemias: Secondary | ICD-10-CM | POA: Diagnosis not present

## 2024-04-21 DIAGNOSIS — S72002D Fracture of unspecified part of neck of left femur, subsequent encounter for closed fracture with routine healing: Secondary | ICD-10-CM | POA: Diagnosis not present

## 2024-04-21 DIAGNOSIS — R29898 Other symptoms and signs involving the musculoskeletal system: Secondary | ICD-10-CM | POA: Diagnosis not present

## 2024-04-21 NOTE — Therapy (Signed)
 OUTPATIENT PHYSICAL THERAPY LOWER EXTREMITY EVALUATION   Patient Name: SKYLIN TANGNEY MRN: [id number] DOB:29-Jun-1941, 83 y.o., female Today's Date: 04/21/2024  END OF SESSION:  PT End of Session - 04/21/24 1329     Visit Number 1    Date for PT Re-Evaluation 05/12/24    Authorization Type [employer] MEDICARE HMO/PPO    PT Start Time 1100    PT Stop Time 1145    PT Time Calculation (min) 45 min    Activity Tolerance Patient tolerated treatment well;Patient limited by pain    Behavior During Therapy [facility] for tasks assessed/performed             Past Medical History:  Diagnosis Date   Colon polyps    Hypertension    Seasonal allergies    Past Surgical History:  Procedure Laterality Date   APPENDECTOMY     CATARACT EXTRACTION W/PHACO Right 09/13/2013   Procedure: CATARACT EXTRACTION PHACO AND INTRAOCULAR LENS PLACEMENT (IOC);  Surgeon: Clay Cummins, MD;  Location: [facility];  Service: Ophthalmology;  Laterality: Right;  CDE:3.25   CATARACT EXTRACTION W/PHACO Left 11/22/2013   Procedure: CATARACT EXTRACTION PHACO AND INTRAOCULAR LENS PLACEMENT (IOC);  Surgeon: Clay Cummins, MD;  Location: [facility];  Service: Ophthalmology;  Laterality: Left;  CDE: 3.95   CESAREAN SECTION     x2   COLONOSCOPY     SLF 2010: sigmoid diverticula, otherwise wnl. Repeat 5 yrs.   HIP ARTHROPLASTY Left 02/25/2024   Procedure: HEMIARTHROPLASTY (BIPOLAR) HIP;  Surgeon: Darrin Emerald, MD;  Location: [facility];  Service: Orthopedics;  Laterality: Left;   I & D EXTREMITY Left    lateral wrist   LAPAROTOMY     oopherectomy   ORIF HUMERUS FRACTURE Left    Patient Active Problem List   Diagnosis Date Noted   Neurocognitive deficits 03/02/2024   Gastroesophageal reflux disease 02/27/2024   Closed displaced fracture of left femoral neck (HCC) 02/24/2024   Chronic kidney disease (CKD) stage G3a/A1, moderately decreased glomerular filtration rate (GFR) between 45-59 mL/min/1.73 square meter and  albuminuria creatinine ratio less than 30 mg/g (HCC) 05/23/2023   Left renal atrophy 05/08/2022   Enlarged thyroid 11/07/2021   Fatigue 04/23/2021   Irregular heartbeat 04/23/2021   Mixed hyperlipidemia 04/23/2021   Vitamin D  insufficiency 04/23/2021   Borderline prolonged QT interval 02/02/2020   Polycythemia 02/01/2020   Essential hypertension    History of colonic polyps 04/24/2015    PCP: Lorre Rosin, NP   REFERRING PROVIDER: Darrin Emerald, MD  REFERRING DIAG: [phone] (ICD-10-CM) - Closed displaced fracture of left femoral neck (HCC)  THERAPY DIAG:  Pain in left hip  Weakness of left lower extremity  Impaired functional mobility, balance, gait, and endurance  Rationale for Evaluation and Treatment: Rehabilitation  ONSET DATE: Early this year, does not remember date  SUBJECTIVE:   SUBJECTIVE STATEMENT: Pt states getting up early in the morning got 3/4 to the bathroom and all of a sudden left leg collapsed and fell on left side, felt immediate pain. Pt had surgery for hip fx and then went to a rehab for 2 weeks. Pt then had a couple of weeks of home PT after that. Pt states that she is not in pain and states the hip does not hurt much anymore just feels weak on that side. Before the fall she was not using a walker and was driving.  PERTINENT HISTORY: Hip Fx PAIN:  Are you having pain? No  PRECAUTIONS: Fall  RED  FLAGS: None   WEIGHT BEARING RESTRICTIONS: No  FALLS:  Has patient fallen in last 6 months? Yes. Number of falls 1  LIVING ENVIRONMENT: Lives with: lives with their family and lives with their son Lives in: Mobile home Stairs: ramped entrance Has following equipment at home: Single point cane, Environmental consultant - 2 wheeled, shower chair, and Ramped entry  OCCUPATION: retired  PLOF: Independent  PATIENT GOALS: To get back where she was; walk without the walker, get stronger left lower extremity. Pt would like to return to yard work and driving as  well.  NEXT MD VISIT: maybe next Friday   OBJECTIVE:  Note: Objective measures were completed at Evaluation unless otherwise noted.  DIAGNOSTIC FINDINGS: CLINICAL DATA:  Fall and left hip pain.   EXAM: DG HIP (WITH OR WITHOUT PELVIS) 2-3V LEFT   COMPARISON:  Pelvic radiograph dated 02/01/2020.   FINDINGS: There is a mildly displaced and impacted fracture of the left femoral neck. No dislocation. The bones are osteopenic. The soft tissues are unremarkable.   IMPRESSION: Mildly displaced and impacted fracture of the left femoral neck.  PATIENT SURVEYS:  LEFS 39/80  COGNITION: Overall cognitive status: Within functional limits for tasks assessed     SENSATION: WFL  EDEMA:  None observed, none reported    PALPATION: Pt reports no tenderness of left hip, assess OP next session  LOWER EXTREMITY ROM:  Active ROM Right eval Left eval  Hip flexion 102 100  Hip extension    Hip abduction    Hip adduction    Hip internal rotation    Hip external rotation    Knee flexion    Knee extension    Ankle dorsiflexion    Ankle plantarflexion    Ankle inversion    Ankle eversion     (Blank rows = not tested)  LOWER EXTREMITY MMT:  MMT Right eval Left eval  Hip flexion 4 3+  Hip extension 4 3+  Hip abduction 4 3+  Hip adduction 4 3+  Hip internal rotation    Hip external rotation    Knee flexion    Knee extension    Ankle dorsiflexion    Ankle plantarflexion    Ankle inversion    Ankle eversion     (Blank rows = not tested)   FUNCTIONAL TESTS:  5 times sit to stand: 16.05 2 minute walk test: 182 feet  GAIT: Distance walked: 182 feet Assistive device utilized: Walker - 2 wheeled Level of assistance: Modified independence Comments: Pt demonstrates decreased stride length and stance time on left side.                                                                                                                                TREATMENT DATE:   04/21/2024   Evaluation: -ROM measured, Strength assessed, HEP prescribed, pt educated on prognosis, findings, and importance of HEP compliance if given.   PATIENT EDUCATION:  Education details:  Pt was educated on findings of PT evaluation, prognosis, frequency of therapy visits and rationale, attendance policy, and HEP if given.   Person educated: Patient Education method: Explanation, Verbal cues, and Handouts Education comprehension: verbalized understanding, verbal cues required, tactile cues required, and needs further education  HOME EXERCISE PROGRAM: Access Code: [email or web address] URL: [email or web address] Date: 04/21/2024 Prepared by: Armond Bertin  Exercises - Supine Bridge  - 1 x daily - 7 x weekly - 3 sets - 10 reps - Sit to Stand Without Arm Support  - 1 x daily - 7 x weekly - 3 sets - 10 reps - Clamshell  - 1 x daily - 7 x weekly - 3 sets - 10 reps  ASSESSMENT:  CLINICAL IMPRESSION: Patient is a 83 y.o. female who was seen today for physical therapy evaluation and treatment for S72.002A (ICD-10-CM) - Closed displaced fracture of left femoral neck (HCC).   Patient demonstrates decreased LLE strength, abnormal pain in left hip with prolonged standing and walking, and impaired balance. Patient also demonstrates difficulty with ambulation during today's session with decreased left stride length, stance time, and velocity noted. Patient also demonstrates decreased quality of movement with bed mobility as it is painful rolling onto left side. Patient requires verbal and tactile cues for HEP prescription and proper form. Patient would benefit from skilled physical therapy for decreased pain and increased endurance with ambulation, increased LE strength, and balance for improved gait quality, return to higher level of function with ADLs, and progress towards therapy goals.   OBJECTIVE IMPAIRMENTS: Abnormal gait, decreased activity tolerance, decreased balance, decreased  endurance, decreased mobility, difficulty walking, decreased strength, and pain.   ACTIVITY LIMITATIONS: carrying, lifting, bending, standing, squatting, stairs, transfers, bed mobility, and locomotion level  PARTICIPATION LIMITATIONS: meal prep, cleaning, laundry, driving, shopping, community activity, and yard work  PERSONAL FACTORS: Age, Fitness, Past/current experiences, and 1 comorbidity: recent fall and hip fx are also affecting patient's functional outcome.   REHAB POTENTIAL: Good  CLINICAL DECISION MAKING: Stable/uncomplicated  EVALUATION COMPLEXITY: Low   GOALS: Goals reviewed with patient? No  SHORT TERM GOALS: Target date: 05/12/24  Patient will demonstrate evidence of independence with individualized HEP and will report compliance for at least 3 days per week for optimized progression towards remaining therapy goals. Baseline:  Goal status: INITIAL  2.  Patient will report a decrease in pain level during community ambulation by at least 2 points for improved quality of life. Baseline: 2/10 Goal status: INITIAL     LONG TERM GOALS: Target date: 06/02/24  Pt will demonstrate a an increase of at least 9 points on the LEFS for improved performance of community ambulation and ADL. Baseline: see above Goal status: INITIAL  2.  Pt will improve 2 MWT by 140  in order to demonstrate improved functional ambulatory capacity in community setting.  Baseline: see above Goal status: INITIAL  3.  Pt will demonstrate WFL pain free ROM in left hip, for increased mobility and maximal efficiency of gait cycle during ambulation. Baseline: see objective Goal status: INITIAL  4.  Pt will demonstrate at least 4/5 MMT for left lower extremity for increased strength during ADL and community ambulation. Baseline: see objective Goal status: INITIAL  5.  Pt will improve 5TSTS by at least 2.3 seconds in order to improve functional strength during functional transfers. Baseline: see  above Goal status: INITIAL    PLAN:  PT FREQUENCY: 1-2x/week  PT DURATION: 6 weeks  PLANNED INTERVENTIONS: 97110-Therapeutic exercises, 97530- Therapeutic activity, 97112-  Neuromuscular re-education, [phone]- Self Care, [id number]- Manual therapy, [phone]- Gait training, Patient/Family education, Balance training, Stair training, Joint mobilization, DME instructions, Cryotherapy, and Moist heat  PLAN FOR NEXT SESSION: Review goals and HEP, obtain hip extension ROM bilaterally, progress LE strengthening, assess balance, possible FGA/DGI   Armond Bertin, PT, DPT [facility] Office: [phone] 1:40 PM, 04/21/24

## 2024-04-22 ENCOUNTER — Encounter: Payer: Self-pay | Admitting: Orthopedic Surgery

## 2024-04-22 ENCOUNTER — Ambulatory Visit: Admitting: Orthopedic Surgery

## 2024-04-22 DIAGNOSIS — S72002A Fracture of unspecified part of neck of left femur, initial encounter for closed fracture: Secondary | ICD-10-CM

## 2024-04-22 NOTE — Progress Notes (Signed)
 Diagnoses:  left femoral neck hip fracture   Procedure: 2 months postop Left hip hemiarthroplasty/bipolar replacement     TXA [used/not used] yes   Operative Finding Completely displaced left femoral neck fracture normal acetabulum  LMP  (LMP Unknown)   There is no height or weight on file to calculate BMI.  Chief Complaint  Patient presents with   Post-op Follow-up    Left hip/ started outpatient therapy yesterday     Encounter Diagnosis  Name Primary?   Closed displaced fracture of left femoral neck (HCC) 02/25/24 bipolar replacement Yes    DOI/DOS/ Date: 02/25/24  Improved  Pain medicine none really  Get up test less than 5 seconds she was up with her walker independently  Leg lengths equal  Hip range of motion 120  See me in 30 days

## 2024-04-22 NOTE — Progress Notes (Signed)
   LMP  (LMP Unknown)   There is no height or weight on file to calculate BMI.  Chief Complaint  Patient presents with   Post-op Follow-up    Left hip/ started outpatient therapy yesterday     Encounter Diagnosis  Name Primary?   Closed displaced fracture of left femoral neck (HCC) 02/25/24 bipolar replacement Yes    DOI/DOS/ Date: 02/25/24  Improved

## 2024-04-23 ENCOUNTER — Encounter (HOSPITAL_COMMUNITY)

## 2024-04-27 ENCOUNTER — Ambulatory Visit (HOSPITAL_COMMUNITY): Admitting: Physical Therapy

## 2024-04-27 DIAGNOSIS — Z7409 Other reduced mobility: Secondary | ICD-10-CM | POA: Diagnosis not present

## 2024-04-27 DIAGNOSIS — S72002A Fracture of unspecified part of neck of left femur, initial encounter for closed fracture: Secondary | ICD-10-CM | POA: Diagnosis not present

## 2024-04-27 DIAGNOSIS — M25552 Pain in left hip: Secondary | ICD-10-CM | POA: Diagnosis not present

## 2024-04-27 DIAGNOSIS — R29898 Other symptoms and signs involving the musculoskeletal system: Secondary | ICD-10-CM

## 2024-04-27 NOTE — Therapy (Signed)
 OUTPATIENT PHYSICAL THERAPY TREATMENT   Patient Name: Tiffany Everett MRN: [id number] DOB:1940/12/12, 83 y.o., female Today's Date: 04/27/2024  END OF SESSION:  PT End of Session - 04/27/24 1015     Visit Number 2    Number of Visits 12    Date for PT Re-Evaluation 05/12/24    Authorization Type [employer] MEDICARE HMO/PPO    Progress Note Due on Visit 10    PT Start Time 0800    PT Stop Time 0842    PT Time Calculation (min) 42 min    Activity Tolerance Patient tolerated treatment well;Patient limited by pain    Behavior During Therapy [facility] for tasks assessed/performed              Past Medical History:  Diagnosis Date   Colon polyps    Hypertension    Seasonal allergies    Past Surgical History:  Procedure Laterality Date   APPENDECTOMY     CATARACT EXTRACTION W/PHACO Right 09/13/2013   Procedure: CATARACT EXTRACTION PHACO AND INTRAOCULAR LENS PLACEMENT (IOC);  Surgeon: Clay Cummins, MD;  Location: [facility];  Service: Ophthalmology;  Laterality: Right;  CDE:3.25   CATARACT EXTRACTION W/PHACO Left 11/22/2013   Procedure: CATARACT EXTRACTION PHACO AND INTRAOCULAR LENS PLACEMENT (IOC);  Surgeon: Clay Cummins, MD;  Location: [facility];  Service: Ophthalmology;  Laterality: Left;  CDE: 3.95   CESAREAN SECTION     x2   COLONOSCOPY     SLF 2010: sigmoid diverticula, otherwise wnl. Repeat 5 yrs.   HIP ARTHROPLASTY Left 02/25/2024   Procedure: HEMIARTHROPLASTY (BIPOLAR) HIP;  Surgeon: Darrin Emerald, MD;  Location: [facility];  Service: Orthopedics;  Laterality: Left;   I & D EXTREMITY Left    lateral wrist   LAPAROTOMY     oopherectomy   ORIF HUMERUS FRACTURE Left    Patient Active Problem List   Diagnosis Date Noted   Neurocognitive deficits 03/02/2024   Gastroesophageal reflux disease 02/27/2024   Closed displaced fracture of left femoral neck (HCC) 02/24/2024   Chronic kidney disease (CKD) stage G3a/A1, moderately decreased glomerular filtration rate (GFR) between  45-59 mL/min/1.73 square meter and albuminuria creatinine ratio less than 30 mg/g (HCC) 05/23/2023   Left renal atrophy 05/08/2022   Enlarged thyroid 11/07/2021   Fatigue 04/23/2021   Irregular heartbeat 04/23/2021   Mixed hyperlipidemia 04/23/2021   Vitamin D  insufficiency 04/23/2021   Borderline prolonged QT interval 02/02/2020   Polycythemia 02/01/2020   Essential hypertension    History of colonic polyps 04/24/2015    PCP: Lorre Rosin, NP   REFERRING PROVIDER: Darrin Emerald, MD  REFERRING DIAG: [phone] (ICD-10-CM) - Closed displaced fracture of left femoral neck (HCC)  THERAPY DIAG:  Pain in left hip  Weakness of left lower extremity  Impaired functional mobility, balance, gait, and endurance  Rationale for Evaluation and Treatment: Rehabilitation  ONSET DATE: Early this year, does not remember date  SUBJECTIVE:   SUBJECTIVE STATEMENT: PT reports compliance with given HEP.  Pt brought today by one of her sons (both sons live with her). Pt using RW.  No pain or issues.   Evaluation: Pt states getting up early in the morning got 3/4 to the bathroom and all of a sudden left leg collapsed and fell on left side, felt immediate pain. Pt had surgery for hip fx and then went to a rehab for 2 weeks. Pt then had a couple of weeks of home PT after that. Pt states that she is not  in pain and states the hip does not hurt much anymore just feels weak on that side. Before the fall she was not using a walker and was driving.  PERTINENT HISTORY: Hip Fx PAIN:  Are you having pain? No  PRECAUTIONS: Fall  RED FLAGS: None   WEIGHT BEARING RESTRICTIONS: No  FALLS:  Has patient fallen in last 6 months? Yes. Number of falls 1  LIVING ENVIRONMENT: Lives with: lives with their family and lives with their son Lives in: Mobile home Stairs: ramped entrance Has following equipment at home: Single point cane, Environmental consultant - 2 wheeled, shower chair, and Ramped entry  OCCUPATION:  retired  PLOF: Independent  PATIENT GOALS: To get back where she was; walk without the walker, get stronger left lower extremity. Pt would like to return to yard work and driving as well.  NEXT MD VISIT: maybe next Friday   OBJECTIVE:  Note: Objective measures were completed at Evaluation unless otherwise noted.  DIAGNOSTIC FINDINGS: CLINICAL DATA:  Fall and left hip pain.   EXAM: DG HIP (WITH OR WITHOUT PELVIS) 2-3V LEFT   COMPARISON:  Pelvic radiograph dated 02/01/2020.   FINDINGS: There is a mildly displaced and impacted fracture of the left femoral neck. No dislocation. The bones are osteopenic. The soft tissues are unremarkable.   IMPRESSION: Mildly displaced and impacted fracture of the left femoral neck.  PATIENT SURVEYS:  LEFS 39/80  COGNITION: Overall cognitive status: Within functional limits for tasks assessed     SENSATION: WFL  EDEMA:  None observed, none reported    PALPATION: Pt reports no tenderness of left hip, assess OP next session  LOWER EXTREMITY ROM:  Active ROM Right eval Left eval  Hip flexion 102 100  Hip extension 5/20: 5 5/20: 0  Hip abduction    Hip adduction    Hip internal rotation    Hip external rotation    Knee flexion    Knee extension    Ankle dorsiflexion    Ankle plantarflexion    Ankle inversion    Ankle eversion     (Blank rows = not tested)  LOWER EXTREMITY MMT:  MMT Right eval Left eval  Hip flexion 4 3+  Hip extension 4 3+  Hip abduction 4 3+  Hip adduction 4 3+  Hip internal rotation    Hip external rotation    Knee flexion    Knee extension    Ankle dorsiflexion    Ankle plantarflexion    Ankle inversion    Ankle eversion     (Blank rows = not tested)   FUNCTIONAL TESTS:  5 times sit to stand: 16.05 2 minute walk test: 182 feet  04/27/24:  Functional Gait Assessment Summary completed with RW 1. GAIT LEVEL SURFACE: Moderate impairment -- gait level surface (1) (1 points) 2. CHANGE IN GAIT  SPEED: Moderate impairment -- change in gait speed (1) (1 points) 3. GAIT WITH HORIZONTAL HEAD TURNS: Moderate impairment -- gait with horizontal head turns (1) (1 points) 4. GAIT WITH VERTICAL HEAD TURNS: Moderate impairment -- gait with vertical head turns (1) (1 points) 5. GAIT AND PIVOT TURN: Moderate impairment -- gait and pivot turn (1) (1 points) 6. STEP OVER OBSTACLE: Moderate impairment -- step over obstacle (1) (1 points) 7. GAIT WITH NARROW BASE OF SUPPORT: Moderate impairment -- gait with narrow base of support (1) (1 points) 8. GAIT WITH EYES CLOSED: Severe impairment -- gait with eyes closed (0) (0 points) 9. AMBULATING BACKWARDS: Moderate impairment -- ambulating  backwards (1) (1 points) 10. STEPS: Mild impairment -- up and down steps (2) (2 points) Functional Gait Assessment: 10/30=33.3 percent. (Normal for age >20/30)   GAIT: Distance walked: 182 feet Assistive device utilized: Walker - 2 wheeled Level of assistance: Modified independence Comments: Pt demonstrates decreased stride length and stance time on left side.                                                                                                                                TREATMENT DATE:  04/27/24 Goal review Supine:  bridge 10X  Clames 10X  SLR 10X each LE Prone:  hip extension ROM testing Rt:5 degrees  Lt: to neutral only Stair negotiation 4" with bil HR  Functional Gait Assessment Summary 1. GAIT LEVEL SURFACE: Moderate impairment -- gait level surface (1) (1 points) 2. CHANGE IN GAIT SPEED: Moderate impairment -- change in gait speed (1) (1 points) 3. GAIT WITH HORIZONTAL HEAD TURNS: Moderate impairment -- gait with horizontal head turns (1) (1 points) 4. GAIT WITH VERTICAL HEAD TURNS: Moderate impairment -- gait with vertical head turns (1) (1 points) 5. GAIT AND PIVOT TURN: Moderate impairment -- gait and pivot turn (1) (1 points) 6. STEP OVER OBSTACLE: Moderate impairment -- step over  obstacle (1) (1 points) 7. GAIT WITH NARROW BASE OF SUPPORT: Moderate impairment -- gait with narrow base of support (1) (1 points) 8. GAIT WITH EYES CLOSED: Severe impairment -- gait with eyes closed (0) (0 points) 9. AMBULATING BACKWARDS: Moderate impairment -- ambulating backwards (1) (1 points) 10. STEPS: Mild impairment -- up and down steps (2) (2 points) Functional Gait Assessment: 10/30=33.3 percent. (Normal for age >20/30)  04/21/2024  Evaluation: -ROM measured, Strength assessed, HEP prescribed, pt educated on prognosis, findings, and importance of HEP compliance if given.   PATIENT EDUCATION:  Education details: Pt was educated on findings of PT evaluation, prognosis, frequency of therapy visits and rationale, attendance policy, and HEP if given.   Person educated: Patient Education method: Explanation, Verbal cues, and Handouts Education comprehension: verbalized understanding, verbal cues required, tactile cues required, and needs further education  HOME EXERCISE PROGRAM: Access Code: [email or web address] URL: [email or web address] Date: 04/21/2024 Prepared by: Armond Bertin  Exercises - Supine Bridge  - 1 x daily - 7 x weekly - 3 sets - 10 reps - Sit to Stand Without Arm Support  - 1 x daily - 7 x weekly - 3 sets - 10 reps - Clamshell  - 1 x daily - 7 x weekly - 3 sets - 10 reps  ASSESSMENT:  CLINICAL IMPRESSION: Reveiwed goals and POC moving forward.  Hip extension ROM tested in prone with deficits noted bilaterally, Lt<RT.  FGA (functional gait assessment) completed using RW and documented in today's treatment note as well in functional test section.  This score is 10/30 which puts patient at high risk of falls (normal >20 for her age group)  Reviewed established HEP with ability  to recall and proceeded with additional LE strengthening exercises.  Pt able to complete stairs reciprocally with use of bil HR's, however tested on shorter 4" steps today.  Pt will continue to  benefit from skilled therapy to improve LE strength and functional stability.     Evaluation: Patient is a 83 y.o. female who was seen today for physical therapy evaluation and treatment for S72.002A (ICD-10-CM) - Closed displaced fracture of left femoral neck (HCC).  Patient demonstrates decreased LLE strength, abnormal pain in left hip with prolonged standing and walking, and impaired balance. Patient also demonstrates difficulty with ambulation during today's session with decreased left stride length, stance time, and velocity noted. Patient also demonstrates decreased quality of movement with bed mobility as it is painful rolling onto left side. Patient requires verbal and tactile cues for HEP prescription and proper form. Patient would benefit from skilled physical therapy for decreased pain and increased endurance with ambulation, increased LE strength, and balance for improved gait quality, return to higher level of function with ADLs, and progress towards therapy goals.   OBJECTIVE IMPAIRMENTS: Abnormal gait, decreased activity tolerance, decreased balance, decreased endurance, decreased mobility, difficulty walking, decreased strength, and pain.   ACTIVITY LIMITATIONS: carrying, lifting, bending, standing, squatting, stairs, transfers, bed mobility, and locomotion level  PARTICIPATION LIMITATIONS: meal prep, cleaning, laundry, driving, shopping, community activity, and yard work  PERSONAL FACTORS: Age, Fitness, Past/current experiences, and 1 comorbidity: recent fall and hip fx are also affecting patient's functional outcome.   REHAB POTENTIAL: Good  CLINICAL DECISION MAKING: Stable/uncomplicated  EVALUATION COMPLEXITY: Low   GOALS: Goals reviewed with patient? Yes  SHORT TERM GOALS: Target date: 05/12/24  Patient will demonstrate evidence of independence with individualized HEP and will report compliance for at least 3 days per week for optimized progression towards remaining  therapy goals. Baseline:  Goal status: IN PROGRESS  2.  Patient will report a decrease in pain level during community ambulation by at least 2 points for improved quality of life. Baseline: 2/10 Goal status: IN PROGRESS     LONG TERM GOALS: Target date: 06/02/24  Pt will demonstrate a an increase of at least 9 points on the LEFS for improved performance of community ambulation and ADL. Baseline: see above Goal status: IN PROGRESS  2.  Pt will improve 2 MWT by 140  in order to demonstrate improved functional ambulatory capacity in community setting.  Baseline: see above Goal status: IN PROGRESS  3.  Pt will demonstrate WFL pain free ROM in left hip, for increased mobility and maximal efficiency of gait cycle during ambulation. Baseline: see objective Goal status: IN PROGRESS  4.  Pt will demonstrate at least 4/5 MMT for left lower extremity for increased strength during ADL and community ambulation. Baseline: see objective Goal status: IN PROGRESS  5.  Pt will improve 5TSTS by at least 2.3 seconds in order to improve functional strength during functional transfers. Baseline: see above Goal status: IN PROGRESS    PLAN:  PT FREQUENCY: 1-2x/week  PT DURATION: 6 weeks  PLANNED INTERVENTIONS: 97110-Therapeutic exercises, 97530- Therapeutic activity, [id number]- Neuromuscular re-education, 97535- Self Care, [id number]- Manual therapy, [phone]- Gait training, Patient/Family education, Balance training, Stair training, Joint mobilization, DME instructions, Cryotherapy, and Moist heat  PLAN FOR NEXT SESSION: Progress functional strengthening and balance.   Lorenso Romance, PTA/CLT [facility] Ph: [phone] 10:19 AM, 04/27/24

## 2024-04-28 ENCOUNTER — Ambulatory Visit (HOSPITAL_COMMUNITY)

## 2024-04-28 ENCOUNTER — Encounter (HOSPITAL_COMMUNITY): Payer: Self-pay

## 2024-04-28 DIAGNOSIS — M25552 Pain in left hip: Secondary | ICD-10-CM | POA: Diagnosis not present

## 2024-04-28 DIAGNOSIS — S72002A Fracture of unspecified part of neck of left femur, initial encounter for closed fracture: Secondary | ICD-10-CM | POA: Diagnosis not present

## 2024-04-28 DIAGNOSIS — R29898 Other symptoms and signs involving the musculoskeletal system: Secondary | ICD-10-CM

## 2024-04-28 DIAGNOSIS — Z7409 Other reduced mobility: Secondary | ICD-10-CM

## 2024-04-28 NOTE — Therapy (Signed)
 OUTPATIENT PHYSICAL THERAPY TREATMENT   Patient Name: Tiffany Everett MRN: [id number] DOB:1941/04/06, 83 y.o., female Today's Date: 04/28/2024  END OF SESSION:  PT End of Session - 04/28/24 1301     Visit Number 3    Number of Visits 12    Date for PT Re-Evaluation 05/12/24    Authorization Type [employer] MEDICARE HMO/PPO    Progress Note Due on Visit 10    PT Start Time 1301    PT Stop Time 1341    PT Time Calculation (min) 40 min    Activity Tolerance Patient tolerated treatment well    Behavior During Therapy [facility] for tasks assessed/performed              Past Medical History:  Diagnosis Date   Colon polyps    Hypertension    Seasonal allergies    Past Surgical History:  Procedure Laterality Date   APPENDECTOMY     CATARACT EXTRACTION W/PHACO Right 09/13/2013   Procedure: CATARACT EXTRACTION PHACO AND INTRAOCULAR LENS PLACEMENT (IOC);  Surgeon: Clay Cummins, MD;  Location: [facility];  Service: Ophthalmology;  Laterality: Right;  CDE:3.25   CATARACT EXTRACTION W/PHACO Left 11/22/2013   Procedure: CATARACT EXTRACTION PHACO AND INTRAOCULAR LENS PLACEMENT (IOC);  Surgeon: Clay Cummins, MD;  Location: [facility];  Service: Ophthalmology;  Laterality: Left;  CDE: 3.95   CESAREAN SECTION     x2   COLONOSCOPY     SLF 2010: sigmoid diverticula, otherwise wnl. Repeat 5 yrs.   HIP ARTHROPLASTY Left 02/25/2024   Procedure: HEMIARTHROPLASTY (BIPOLAR) HIP;  Surgeon: Darrin Emerald, MD;  Location: [facility];  Service: Orthopedics;  Laterality: Left;   I & D EXTREMITY Left    lateral wrist   LAPAROTOMY     oopherectomy   ORIF HUMERUS FRACTURE Left    Patient Active Problem List   Diagnosis Date Noted   Neurocognitive deficits 03/02/2024   Gastroesophageal reflux disease 02/27/2024   Closed displaced fracture of left femoral neck (HCC) 02/24/2024   Chronic kidney disease (CKD) stage G3a/A1, moderately decreased glomerular filtration rate (GFR) between 45-59 mL/min/1.73  square meter and albuminuria creatinine ratio less than 30 mg/g (HCC) 05/23/2023   Left renal atrophy 05/08/2022   Enlarged thyroid 11/07/2021   Fatigue 04/23/2021   Irregular heartbeat 04/23/2021   Mixed hyperlipidemia 04/23/2021   Vitamin D  insufficiency 04/23/2021   Borderline prolonged QT interval 02/02/2020   Polycythemia 02/01/2020   Essential hypertension    History of colonic polyps 04/24/2015    PCP: Lorre Rosin, NP   REFERRING PROVIDER: Darrin Emerald, MD  REFERRING DIAG: [phone] (ICD-10-CM) - Closed displaced fracture of left femoral neck (HCC)  THERAPY DIAG:  Pain in left hip  Weakness of left lower extremity  Impaired functional mobility, balance, gait, and endurance  Rationale for Evaluation and Treatment: Rehabilitation  ONSET DATE: Early this year, does not remember date  SUBJECTIVE:   SUBJECTIVE STATEMENT: Intermittent sharp pain in hip.  No reports of recent fall.  Exercises are going well at home.    Evaluation: Pt states getting up early in the morning got 3/4 to the bathroom and all of a sudden left leg collapsed and fell on left side, felt immediate pain. Pt had surgery for hip fx and then went to a rehab for 2 weeks. Pt then had a couple of weeks of home PT after that. Pt states that she is not in pain and states the hip does not hurt much anymore just  feels weak on that side. Before the fall she was not using a walker and was driving.  PERTINENT HISTORY: Hip Fx PAIN:  Are you having pain? No and Yes: NPRS scale: 3/10 Pain location: Lt hip Pain description: Sharp Aggravating factors: Positioning Relieving factors: Rearrange positioning  PRECAUTIONS: Fall  RED FLAGS: None   WEIGHT BEARING RESTRICTIONS: No  FALLS:  Has patient fallen in last 6 months? Yes. Number of falls 1  LIVING ENVIRONMENT: Lives with: lives with their family and lives with their son Lives in: Mobile home Stairs: ramped entrance Has following equipment  at home: Single point cane, Environmental consultant - 2 wheeled, shower chair, and Ramped entry  OCCUPATION: retired  PLOF: Independent  PATIENT GOALS: To get back where she was; walk without the walker, get stronger left lower extremity. Pt would like to return to yard work and driving as well.  NEXT MD VISIT: maybe next Friday   OBJECTIVE:  Note: Objective measures were completed at Evaluation unless otherwise noted.  DIAGNOSTIC FINDINGS: CLINICAL DATA:  Fall and left hip pain.   EXAM: DG HIP (WITH OR WITHOUT PELVIS) 2-3V LEFT   COMPARISON:  Pelvic radiograph dated 02/01/2020.   FINDINGS: There is a mildly displaced and impacted fracture of the left femoral neck. No dislocation. The bones are osteopenic. The soft tissues are unremarkable.   IMPRESSION: Mildly displaced and impacted fracture of the left femoral neck.  PATIENT SURVEYS:  LEFS 39/80  COGNITION: Overall cognitive status: Within functional limits for tasks assessed     SENSATION: WFL  EDEMA:  None observed, none reported    PALPATION: Pt reports no tenderness of left hip, assess OP next session  LOWER EXTREMITY ROM:  Active ROM Right eval Left eval  Hip flexion 102 100  Hip extension 5/20: 5 5/20: 0  Hip abduction    Hip adduction    Hip internal rotation    Hip external rotation    Knee flexion    Knee extension    Ankle dorsiflexion    Ankle plantarflexion    Ankle inversion    Ankle eversion     (Blank rows = not tested)  LOWER EXTREMITY MMT:  MMT Right eval Left eval  Hip flexion 4 3+  Hip extension 4 3+  Hip abduction 4 3+  Hip adduction 4 3+  Hip internal rotation    Hip external rotation    Knee flexion    Knee extension    Ankle dorsiflexion    Ankle plantarflexion    Ankle inversion    Ankle eversion     (Blank rows = not tested)   FUNCTIONAL TESTS:  5 times sit to stand: 16.05 2 minute walk test: 182 feet  04/27/24:  Functional Gait Assessment Summary completed with RW 1.  GAIT LEVEL SURFACE: Moderate impairment -- gait level surface (1) (1 points) 2. CHANGE IN GAIT SPEED: Moderate impairment -- change in gait speed (1) (1 points) 3. GAIT WITH HORIZONTAL HEAD TURNS: Moderate impairment -- gait with horizontal head turns (1) (1 points) 4. GAIT WITH VERTICAL HEAD TURNS: Moderate impairment -- gait with vertical head turns (1) (1 points) 5. GAIT AND PIVOT TURN: Moderate impairment -- gait and pivot turn (1) (1 points) 6. STEP OVER OBSTACLE: Moderate impairment -- step over obstacle (1) (1 points) 7. GAIT WITH NARROW BASE OF SUPPORT: Moderate impairment -- gait with narrow base of support (1) (1 points) 8. GAIT WITH EYES CLOSED: Severe impairment -- gait with eyes closed (0) (0 points)  9. AMBULATING BACKWARDS: Moderate impairment -- ambulating backwards (1) (1 points) 10. STEPS: Mild impairment -- up and down steps (2) (2 points) Functional Gait Assessment: 10/30=33.3 percent. (Normal for age >20/30)   GAIT: Distance walked: 182 feet Assistive device utilized: Walker - 2 wheeled Level of assistance: Modified independence Comments: Pt demonstrates decreased stride length and stance time on left side.                                                                                                                                TREATMENT DATE:  04/28/24: Standing: - Heel raises 10x - Toe raises 10x - Marching alternating with HHA 10x 5"  - Abduction with HHA (cueing to reduce ER - Tandem stance 2x 30" - Sidestep with RTB around thigh 4RT inside // bars - Squats 10x front of chair Sit to stand - 10x with elevated surface - 10x with standard height (4 episodes with posterior lean required min A)  04/27/24 Goal review Supine:  bridge 10X  Clames 10X  SLR 10X each LE Prone:  hip extension ROM testing Rt:5 degrees  Lt: to neutral only Stair negotiation 4" with bil HR  Functional Gait Assessment Summary 1. GAIT LEVEL SURFACE: Moderate impairment -- gait  level surface (1) (1 points) 2. CHANGE IN GAIT SPEED: Moderate impairment -- change in gait speed (1) (1 points) 3. GAIT WITH HORIZONTAL HEAD TURNS: Moderate impairment -- gait with horizontal head turns (1) (1 points) 4. GAIT WITH VERTICAL HEAD TURNS: Moderate impairment -- gait with vertical head turns (1) (1 points) 5. GAIT AND PIVOT TURN: Moderate impairment -- gait and pivot turn (1) (1 points) 6. STEP OVER OBSTACLE: Moderate impairment -- step over obstacle (1) (1 points) 7. GAIT WITH NARROW BASE OF SUPPORT: Moderate impairment -- gait with narrow base of support (1) (1 points) 8. GAIT WITH EYES CLOSED: Severe impairment -- gait with eyes closed (0) (0 points) 9. AMBULATING BACKWARDS: Moderate impairment -- ambulating backwards (1) (1 points) 10. STEPS: Mild impairment -- up and down steps (2) (2 points) Functional Gait Assessment: 10/30=33.3 percent. (Normal for age >20/30)  04/21/2024  Evaluation: -ROM measured, Strength assessed, HEP prescribed, pt educated on prognosis, findings, and importance of HEP compliance if given.   PATIENT EDUCATION:  Education details: Pt was educated on findings of PT evaluation, prognosis, frequency of therapy visits and rationale, attendance policy, and HEP if given.   Person educated: Patient Education method: Explanation, Verbal cues, and Handouts Education comprehension: verbalized understanding, verbal cues required, tactile cues required, and needs further education  HOME EXERCISE PROGRAM: Access Code: [email or web address] URL: [email or web address] Date: 04/21/2024 Prepared by: Armond Bertin  Exercises - Supine Bridge  - 1 x daily - 7 x weekly - 3 sets - 10 reps - Sit to Stand Without Arm Support  - 1 x daily - 7 x weekly - 3 sets - 10 reps - Clamshell  - 1 x daily - 7  x weekly - 3 sets - 10 reps  ASSESSMENT:  CLINICAL IMPRESSION: Session focus with LE strengthening and balance training.  Pt required elevated height with STS initially  with cueing for mechanics to improve ease with standing.  Able to reduce height second set to standard chair height though required min A for posterior lean upon standing 4 times with cueing to increased weight bearing on midfoot vs heels to stabilize.  Added standing hip strengthening exercises with min cueing for proper form and mechanics.  Occasional seated rest break required per fatigue.  Pt able to complete whole session with no reports of increased pain.  Reviewed current HEP as reports she has not began STS at home, given printout with current exercise program.  Did require some reminding to keep up with reps through session.  Evaluation: Patient is a 83 y.o. female who was seen today for physical therapy evaluation and treatment for S72.002A (ICD-10-CM) - Closed displaced fracture of left femoral neck (HCC).  Patient demonstrates decreased LLE strength, abnormal pain in left hip with prolonged standing and walking, and impaired balance. Patient also demonstrates difficulty with ambulation during today's session with decreased left stride length, stance time, and velocity noted. Patient also demonstrates decreased quality of movement with bed mobility as it is painful rolling onto left side. Patient requires verbal and tactile cues for HEP prescription and proper form. Patient would benefit from skilled physical therapy for decreased pain and increased endurance with ambulation, increased LE strength, and balance for improved gait quality, return to higher level of function with ADLs, and progress towards therapy goals.   OBJECTIVE IMPAIRMENTS: Abnormal gait, decreased activity tolerance, decreased balance, decreased endurance, decreased mobility, difficulty walking, decreased strength, and pain.   ACTIVITY LIMITATIONS: carrying, lifting, bending, standing, squatting, stairs, transfers, bed mobility, and locomotion level  PARTICIPATION LIMITATIONS: meal prep, cleaning, laundry, driving, shopping,  community activity, and yard work  PERSONAL FACTORS: Age, Fitness, Past/current experiences, and 1 comorbidity: recent fall and hip fx are also affecting patient's functional outcome.   REHAB POTENTIAL: Good  CLINICAL DECISION MAKING: Stable/uncomplicated  EVALUATION COMPLEXITY: Low   GOALS: Goals reviewed with patient? Yes  SHORT TERM GOALS: Target date: 05/12/24  Patient will demonstrate evidence of independence with individualized HEP and will report compliance for at least 3 days per week for optimized progression towards remaining therapy goals. Baseline:  Goal status: IN PROGRESS  2.  Patient will report a decrease in pain level during community ambulation by at least 2 points for improved quality of life. Baseline: 2/10 Goal status: IN PROGRESS     LONG TERM GOALS: Target date: 06/02/24  Pt will demonstrate a an increase of at least 9 points on the LEFS for improved performance of community ambulation and ADL. Baseline: see above Goal status: IN PROGRESS  2.  Pt will improve 2 MWT by 140  in order to demonstrate improved functional ambulatory capacity in community setting.  Baseline: see above Goal status: IN PROGRESS  3.  Pt will demonstrate WFL pain free ROM in left hip, for increased mobility and maximal efficiency of gait cycle during ambulation. Baseline: see objective Goal status: IN PROGRESS  4.  Pt will demonstrate at least 4/5 MMT for left lower extremity for increased strength during ADL and community ambulation. Baseline: see objective Goal status: IN PROGRESS  5.  Pt will improve 5TSTS by at least 2.3 seconds in order to improve functional strength during functional transfers. Baseline: see above Goal status: IN PROGRESS  PLAN:  PT FREQUENCY: 1-2x/week  PT DURATION: 6 weeks  PLANNED INTERVENTIONS: 97110-Therapeutic exercises, 97530- Therapeutic activity, [id number]- Neuromuscular re-education, 97535- Self Care, [id number]- Manual therapy, [phone]- Gait  training, Patient/Family education, Balance training, Stair training, Joint mobilization, DME instructions, Cryotherapy, and Moist heat  PLAN FOR NEXT SESSION: Progress functional strengthening and balance.   Minor Amble, LPTA/CLT; CBIS [phone]  2:44 PM, 04/28/24

## 2024-04-29 DIAGNOSIS — I499 Cardiac arrhythmia, unspecified: Secondary | ICD-10-CM | POA: Diagnosis not present

## 2024-04-29 DIAGNOSIS — R55 Syncope and collapse: Secondary | ICD-10-CM | POA: Diagnosis not present

## 2024-04-30 ENCOUNTER — Encounter (HOSPITAL_COMMUNITY)

## 2024-05-04 ENCOUNTER — Encounter (HOSPITAL_COMMUNITY)

## 2024-05-07 ENCOUNTER — Ambulatory Visit (HOSPITAL_COMMUNITY)

## 2024-05-07 DIAGNOSIS — Z7409 Other reduced mobility: Secondary | ICD-10-CM | POA: Diagnosis not present

## 2024-05-07 DIAGNOSIS — M25552 Pain in left hip: Secondary | ICD-10-CM | POA: Diagnosis not present

## 2024-05-07 DIAGNOSIS — R29898 Other symptoms and signs involving the musculoskeletal system: Secondary | ICD-10-CM

## 2024-05-07 DIAGNOSIS — S72002A Fracture of unspecified part of neck of left femur, initial encounter for closed fracture: Secondary | ICD-10-CM | POA: Diagnosis not present

## 2024-05-07 NOTE — Therapy (Signed)
 OUTPATIENT PHYSICAL THERAPY TREATMENT   Patient Name: NECOLE MINASSIAN MRN: [id number] DOB:1941-01-25, 83 y.o., female Today's Date: 05/07/2024  END OF SESSION:  PT End of Session - 05/07/24 1145     Visit Number 4    Number of Visits 12    Date for PT Re-Evaluation 05/12/24    Authorization Type [employer] MEDICARE HMO/PPO    Progress Note Due on Visit 10    PT Start Time 1146    PT Stop Time 1225    PT Time Calculation (min) 39 min    Activity Tolerance Patient tolerated treatment well    Behavior During Therapy WFL for tasks assessed/performed              Past Medical History:  Diagnosis Date   Colon polyps    Hypertension    Seasonal allergies    Past Surgical History:  Procedure Laterality Date   APPENDECTOMY     CATARACT EXTRACTION W/PHACO Right 09/13/2013   Procedure: CATARACT EXTRACTION PHACO AND INTRAOCULAR LENS PLACEMENT (IOC);  Surgeon: Clay Cummins, MD;  Location: [facility];  Service: Ophthalmology;  Laterality: Right;  CDE:3.25   CATARACT EXTRACTION W/PHACO Left 11/22/2013   Procedure: CATARACT EXTRACTION PHACO AND INTRAOCULAR LENS PLACEMENT (IOC);  Surgeon: Clay Cummins, MD;  Location: [facility];  Service: Ophthalmology;  Laterality: Left;  CDE: 3.95   CESAREAN SECTION     x2   COLONOSCOPY     SLF 2010: sigmoid diverticula, otherwise wnl. Repeat 5 yrs.   HIP ARTHROPLASTY Left 02/25/2024   Procedure: HEMIARTHROPLASTY (BIPOLAR) HIP;  Surgeon: Darrin Emerald, MD;  Location: [facility];  Service: Orthopedics;  Laterality: Left;   I & D EXTREMITY Left    lateral wrist   LAPAROTOMY     oopherectomy   ORIF HUMERUS FRACTURE Left    Patient Active Problem List   Diagnosis Date Noted   Neurocognitive deficits 03/02/2024   Gastroesophageal reflux disease 02/27/2024   Closed displaced fracture of left femoral neck (HCC) 02/24/2024   Chronic kidney disease (CKD) stage G3a/A1, moderately decreased glomerular filtration rate (GFR) between 45-59 mL/min/1.73  square meter and albuminuria creatinine ratio less than 30 mg/g (HCC) 05/23/2023   Left renal atrophy 05/08/2022   Enlarged thyroid  11/07/2021   Fatigue 04/23/2021   Irregular heartbeat 04/23/2021   Mixed hyperlipidemia 04/23/2021   Vitamin D  insufficiency 04/23/2021   Borderline prolonged QT interval 02/02/2020   Polycythemia 02/01/2020   Essential hypertension    History of colonic polyps 04/24/2015    PCP: Lorre Rosin, NP   REFERRING PROVIDER: Darrin Emerald, MD  REFERRING DIAG: [phone] (ICD-10-CM) - Closed displaced fracture of left femoral neck (HCC)  THERAPY DIAG:  Pain in left hip  Weakness of left lower extremity  Impaired functional mobility, balance, gait, and endurance  Rationale for Evaluation and Treatment: Rehabilitation  ONSET DATE: Early this year, does not remember date  SUBJECTIVE:   SUBJECTIVE STATEMENT: A little pain in the left hip; 3/10 or so.  Overall it's a little better.  States she is doing some of her exercises at home.     Evaluation: Pt states getting up early in the morning got 3/4 to the bathroom and all of a sudden left leg collapsed and fell on left side, felt immediate pain. Pt had surgery for hip fx and then went to a rehab for 2 weeks. Pt then had a couple of weeks of home PT after that. Pt states that she is not in pain  and states the hip does not hurt much anymore just feels weak on that side. Before the fall she was not using a walker and was driving.  PERTINENT HISTORY: Hip Fx PAIN:  Are you having pain? No and Yes: NPRS scale: 3/10 Pain location: Lt hip Pain description: Sharp Aggravating factors: Positioning Relieving factors: Rearrange positioning  PRECAUTIONS: Fall  RED FLAGS: None   WEIGHT BEARING RESTRICTIONS: No  FALLS:  Has patient fallen in last 6 months? Yes. Number of falls 1  LIVING ENVIRONMENT: Lives with: lives with their family and lives with their son Lives in: Mobile home Stairs: ramped  entrance Has following equipment at home: Single point cane, Environmental consultant - 2 wheeled, shower chair, and Ramped entry  OCCUPATION: retired  PLOF: Independent  PATIENT GOALS: To get back where she was; walk without the walker, get stronger left lower extremity. Pt would like to return to yard work and driving as well.  NEXT MD VISIT: maybe next Friday   OBJECTIVE:  Note: Objective measures were completed at Evaluation unless otherwise noted.  DIAGNOSTIC FINDINGS: CLINICAL DATA:  Fall and left hip pain.   EXAM: DG HIP (WITH OR WITHOUT PELVIS) 2-3V LEFT   COMPARISON:  Pelvic radiograph dated 02/01/2020.   FINDINGS: There is a mildly displaced and impacted fracture of the left femoral neck. No dislocation. The bones are osteopenic. The soft tissues are unremarkable.   IMPRESSION: Mildly displaced and impacted fracture of the left femoral neck.  PATIENT SURVEYS:  LEFS 39/80  COGNITION: Overall cognitive status: Within functional limits for tasks assessed     SENSATION: WFL  EDEMA:  None observed, none reported    PALPATION: Pt reports no tenderness of left hip, assess OP next session  LOWER EXTREMITY ROM:  Active ROM Right eval Left eval  Hip flexion 102 100  Hip extension 5/20: 5 5/20: 0  Hip abduction    Hip adduction    Hip internal rotation    Hip external rotation    Knee flexion    Knee extension    Ankle dorsiflexion    Ankle plantarflexion    Ankle inversion    Ankle eversion     (Blank rows = not tested)  LOWER EXTREMITY MMT:  MMT Right eval Left eval  Hip flexion 4 3+  Hip extension 4 3+  Hip abduction 4 3+  Hip adduction 4 3+  Hip internal rotation    Hip external rotation    Knee flexion    Knee extension    Ankle dorsiflexion    Ankle plantarflexion    Ankle inversion    Ankle eversion     (Blank rows = not tested)   FUNCTIONAL TESTS:  5 times sit to stand: 16.05 2 minute walk test: 182 feet  04/27/24:  Functional Gait  Assessment Summary completed with RW 1. GAIT LEVEL SURFACE: Moderate impairment -- gait level surface (1) (1 points) 2. CHANGE IN GAIT SPEED: Moderate impairment -- change in gait speed (1) (1 points) 3. GAIT WITH HORIZONTAL HEAD TURNS: Moderate impairment -- gait with horizontal head turns (1) (1 points) 4. GAIT WITH VERTICAL HEAD TURNS: Moderate impairment -- gait with vertical head turns (1) (1 points) 5. GAIT AND PIVOT TURN: Moderate impairment -- gait and pivot turn (1) (1 points) 6. STEP OVER OBSTACLE: Moderate impairment -- step over obstacle (1) (1 points) 7. GAIT WITH NARROW BASE OF SUPPORT: Moderate impairment -- gait with narrow base of support (1) (1 points) 8. GAIT WITH EYES CLOSED:  Severe impairment -- gait with eyes closed (0) (0 points) 9. AMBULATING BACKWARDS: Moderate impairment -- ambulating backwards (1) (1 points) 10. STEPS: Mild impairment -- up and down steps (2) (2 points) Functional Gait Assessment: 10/30=33.3 percent. (Normal for age >20/30)   GAIT: Distance walked: 182 feet Assistive device utilized: Walker - 2 wheeled Level of assistance: Modified independence Comments: Pt demonstrates decreased stride length and stance time on left side.                                                                                                                                TREATMENT DATE:  05/07/24 Standing in // bars Heel raises 2 x 10 Toe raises 2 x 10 Marching 2 x 10 Sidestepping down and back x 3 Squat 2 x 10 holding to bars chair for target Stagger stance 2 x 30" each Hip extension 2 x 10 Hip abduction 2 x 10 Walking down and back x 2 using 1 hand to simulate 1 hand on AD; progression to cane Nustep to end treatment seat 9 x 5'  04/28/24: Standing: - Heel raises 10x - Toe raises 10x - Marching alternating with HHA 10x 5"  - Abduction with HHA (cueing to reduce ER - Tandem stance 2x 30" - Sidestep with RTB around thigh 4RT inside // bars - Squats 10x  front of chair Sit to stand - 10x with elevated surface - 10x with standard height (4 episodes with posterior lean required min A)  04/27/24 Goal review Supine:  bridge 10X  Clames 10X  SLR 10X each LE Prone:  hip extension ROM testing Rt:5 degrees  Lt: to neutral only Stair negotiation 4" with bil HR  Functional Gait Assessment Summary 1. GAIT LEVEL SURFACE: Moderate impairment -- gait level surface (1) (1 points) 2. CHANGE IN GAIT SPEED: Moderate impairment -- change in gait speed (1) (1 points) 3. GAIT WITH HORIZONTAL HEAD TURNS: Moderate impairment -- gait with horizontal head turns (1) (1 points) 4. GAIT WITH VERTICAL HEAD TURNS: Moderate impairment -- gait with vertical head turns (1) (1 points) 5. GAIT AND PIVOT TURN: Moderate impairment -- gait and pivot turn (1) (1 points) 6. STEP OVER OBSTACLE: Moderate impairment -- step over obstacle (1) (1 points) 7. GAIT WITH NARROW BASE OF SUPPORT: Moderate impairment -- gait with narrow base of support (1) (1 points) 8. GAIT WITH EYES CLOSED: Severe impairment -- gait with eyes closed (0) (0 points) 9. AMBULATING BACKWARDS: Moderate impairment -- ambulating backwards (1) (1 points) 10. STEPS: Mild impairment -- up and down steps (2) (2 points) Functional Gait Assessment: 10/30=33.3 percent. (Normal for age >20/30)  04/21/2024  Evaluation: -ROM measured, Strength assessed, HEP prescribed, pt educated on prognosis, findings, and importance of HEP compliance if given.   PATIENT EDUCATION:  Education details: Pt was educated on findings of PT evaluation, prognosis, frequency of therapy visits and rationale, attendance policy, and HEP if given.   Person educated:  Patient Education method: Explanation, Verbal cues, and Handouts Education comprehension: verbalized understanding, verbal cues required, tactile cues required, and needs further education  HOME EXERCISE PROGRAM: Access Code: [email or web address] URL:  [email or web address] Date: 04/21/2024 Prepared by: Armond Bertin  Exercises - Supine Bridge  - 1 x daily - 7 x weekly - 3 sets - 10 reps - Sit to Stand Without Arm Support  - 1 x daily - 7 x weekly - 3 sets - 10 reps - Clamshell  - 1 x daily - 7 x weekly - 3 sets - 10 reps  ASSESSMENT:  CLINICAL IMPRESSION: Continued with  focus with LE strengthening and balance training. Patient moves slowly and cautiously throughout treatment and relies on hands to assist with balance and transitions with sit to stand, turns.  Patient needs cues to take larger steps with sidestepping and to not focus on looking at her feet.  Patient needs several seated rest breaks throughout treatment and frequently needs cues for technique during exercises. Most difficulty noted with turns. Continues with slight trendelenburg gait.    Patient will benefit from continued skilled therapy services to address deficits and promote return to optimal function.      Evaluation: Patient is a 83 y.o. female who was seen today for physical therapy evaluation and treatment for S72.002A (ICD-10-CM) - Closed displaced fracture of left femoral neck (HCC).  Patient demonstrates decreased LLE strength, abnormal pain in left hip with prolonged standing and walking, and impaired balance. Patient also demonstrates difficulty with ambulation during today's session with decreased left stride length, stance time, and velocity noted. Patient also demonstrates decreased quality of movement with bed mobility as it is painful rolling onto left side. Patient requires verbal and tactile cues for HEP prescription and proper form. Patient would benefit from skilled physical therapy for decreased pain and increased endurance with ambulation, increased LE strength, and balance for improved gait quality, return to higher level of function with ADLs, and progress towards therapy goals.   OBJECTIVE IMPAIRMENTS: Abnormal gait, decreased activity  tolerance, decreased balance, decreased endurance, decreased mobility, difficulty walking, decreased strength, and pain.   ACTIVITY LIMITATIONS: carrying, lifting, bending, standing, squatting, stairs, transfers, bed mobility, and locomotion level  PARTICIPATION LIMITATIONS: meal prep, cleaning, laundry, driving, shopping, community activity, and yard work  PERSONAL FACTORS: Age, Fitness, Past/current experiences, and 1 comorbidity: recent fall and hip fx are also affecting patient's functional outcome.   REHAB POTENTIAL: Good  CLINICAL DECISION MAKING: Stable/uncomplicated  EVALUATION COMPLEXITY: Low   GOALS: Goals reviewed with patient? Yes  SHORT TERM GOALS: Target date: 05/12/24  Patient will demonstrate evidence of independence with individualized HEP and will report compliance for at least 3 days per week for optimized progression towards remaining therapy goals. Baseline:  Goal status: IN PROGRESS  2.  Patient will report a decrease in pain level during community ambulation by at least 2 points for improved quality of life. Baseline: 2/10 Goal status: IN PROGRESS     LONG TERM GOALS: Target date: 06/02/24  Pt will demonstrate a an increase of at least 9 points on the LEFS for improved performance of community ambulation and ADL. Baseline: see above Goal status: IN PROGRESS  2.  Pt will improve 2 MWT by 140  in order to demonstrate improved functional ambulatory capacity in community setting.  Baseline: see above Goal status: IN PROGRESS  3.  Pt will demonstrate WFL pain free ROM in left hip, for increased mobility and maximal efficiency of gait cycle during ambulation. Baseline:  see objective Goal status: IN PROGRESS  4.  Pt will demonstrate at least 4/5 MMT for left lower extremity for increased strength during ADL and community ambulation. Baseline: see objective Goal status: IN PROGRESS  5.  Pt will improve 5TSTS by at least 2.3 seconds in order to improve  functional strength during functional transfers. Baseline: see above Goal status: IN PROGRESS    PLAN:  PT FREQUENCY: 1-2x/week  PT DURATION: 6 weeks  PLANNED INTERVENTIONS: 97110-Therapeutic exercises, 97530- Therapeutic activity, [id number]- Neuromuscular re-education, 97535- Self Care, [id number]- Manual therapy, [phone]- Gait training, Patient/Family education, Balance training, Stair training, Joint mobilization, DME instructions, Cryotherapy, and Moist heat  PLAN FOR NEXT SESSION: Progress functional strengthening and balance. Returns to [facility] 05/24/24   12:22 PM, 05/07/24 Johniece Hornbaker Small Kiely Cousar MPT  physical therapy [place] [phone]

## 2024-05-10 ENCOUNTER — Ambulatory Visit (HOSPITAL_COMMUNITY): Attending: Orthopedic Surgery

## 2024-05-10 DIAGNOSIS — M25552 Pain in left hip: Secondary | ICD-10-CM | POA: Diagnosis not present

## 2024-05-10 DIAGNOSIS — Z7409 Other reduced mobility: Secondary | ICD-10-CM | POA: Diagnosis not present

## 2024-05-10 DIAGNOSIS — R29898 Other symptoms and signs involving the musculoskeletal system: Secondary | ICD-10-CM | POA: Insufficient documentation

## 2024-05-10 NOTE — Therapy (Signed)
 OUTPATIENT PHYSICAL THERAPY TREATMENT   Patient Name: Tiffany Everett MRN: [id number] DOB:[birth year], 83 y.o., female Today's Date: 05/10/2024  END OF SESSION:  PT End of Session - 05/10/24 1140     Visit Number 5    Number of Visits 12    Date for PT Re-Evaluation 06/02/24    Authorization Type [employer] MEDICARE HMO/PPO    Progress Note Due on Visit 10    PT Start Time 1140    PT Stop Time 1222    PT Time Calculation (min) 42 min    Activity Tolerance Patient tolerated treatment well    Behavior During Therapy WFL for tasks assessed/performed              Past Medical History:  Diagnosis Date   Colon polyps    Hypertension    Seasonal allergies    Past Surgical History:  Procedure Laterality Date   APPENDECTOMY     CATARACT EXTRACTION W/PHACO Right 09/13/2013   Procedure: CATARACT EXTRACTION PHACO AND INTRAOCULAR LENS PLACEMENT (IOC);  Surgeon: Clay Cummins, MD;  Location: [facility];  Service: Ophthalmology;  Laterality: Right;  CDE:3.25   CATARACT EXTRACTION W/PHACO Left 11/22/2013   Procedure: CATARACT EXTRACTION PHACO AND INTRAOCULAR LENS PLACEMENT (IOC);  Surgeon: Clay Cummins, MD;  Location: [facility];  Service: Ophthalmology;  Laterality: Left;  CDE: 3.95   CESAREAN SECTION     x2   COLONOSCOPY     SLF 2010: sigmoid diverticula, otherwise wnl. Repeat 5 yrs.   HIP ARTHROPLASTY Left 02/25/2024   Procedure: HEMIARTHROPLASTY (BIPOLAR) HIP;  Surgeon: Darrin Emerald, MD;  Location: [facility];  Service: Orthopedics;  Laterality: Left;   I & D EXTREMITY Left    lateral wrist   LAPAROTOMY     oopherectomy   ORIF HUMERUS FRACTURE Left    Patient Active Problem List   Diagnosis Date Noted   Neurocognitive deficits 03/02/2024   Gastroesophageal reflux disease 02/27/2024   Closed displaced fracture of left femoral neck (HCC) 02/24/2024   Chronic kidney disease (CKD) stage G3a/A1, moderately decreased glomerular filtration rate (GFR) between 45-59 mL/min/1.73 square  meter and albuminuria creatinine ratio less than 30 mg/g (HCC) 05/23/2023   Left renal atrophy 05/08/2022   Enlarged thyroid  11/07/2021   Fatigue 04/23/2021   Irregular heartbeat 04/23/2021   Mixed hyperlipidemia 04/23/2021   Vitamin D  insufficiency 04/23/2021   Borderline prolonged QT interval 02/02/2020   Polycythemia 02/01/2020   Essential hypertension    History of colonic polyps 04/24/2015    PCP: Lorre Rosin, NP   REFERRING PROVIDER: Darrin Emerald, MD  REFERRING DIAG: [phone] (ICD-10-CM) - Closed displaced fracture of left femoral neck (HCC)  THERAPY DIAG:  Pain in left hip  Weakness of left lower extremity  Impaired functional mobility, balance, gait, and endurance  Rationale for Evaluation and Treatment: Rehabilitation  ONSET DATE: Early this year, does not remember date  SUBJECTIVE:   SUBJECTIVE STATEMENT: Not really having any pain on arrival today; reports no new falls   Evaluation: Pt states getting up early in the morning got 3/4 to the bathroom and all of a sudden left leg collapsed and fell on left side, felt immediate pain. Pt had surgery for hip fx and then went to a rehab for 2 weeks. Pt then had a couple of weeks of home PT after that. Pt states that she is not in pain and states the hip does not hurt much anymore just feels weak on that side. Before the  fall she was not using a walker and was driving.  PERTINENT HISTORY: Hip Fx PAIN:  Are you having pain? No and Yes: NPRS scale: 3/10 Pain location: Lt hip Pain description: Sharp Aggravating factors: Positioning Relieving factors: Rearrange positioning  PRECAUTIONS: Fall  RED FLAGS: None   WEIGHT BEARING RESTRICTIONS: No  FALLS:  Has patient fallen in last 6 months? Yes. Number of falls 1  LIVING ENVIRONMENT: Lives with: lives with their family and lives with their son Lives in: Mobile home Stairs: ramped entrance Has following equipment at home: Single point cane, Environmental consultant -  2 wheeled, shower chair, and Ramped entry  OCCUPATION: retired  PLOF: Independent  PATIENT GOALS: To get back where she was; walk without the walker, get stronger left lower extremity. Pt would like to return to yard work and driving as well.  NEXT MD VISIT: maybe next Friday   OBJECTIVE:  Note: Objective measures were completed at Evaluation unless otherwise noted.  DIAGNOSTIC FINDINGS: CLINICAL DATA:  Fall and left hip pain.   EXAM: DG HIP (WITH OR WITHOUT PELVIS) 2-3V LEFT   COMPARISON:  Pelvic radiograph dated 02/01/2020.   FINDINGS: There is a mildly displaced and impacted fracture of the left femoral neck. No dislocation. The bones are osteopenic. The soft tissues are unremarkable.   IMPRESSION: Mildly displaced and impacted fracture of the left femoral neck.  PATIENT SURVEYS:  LEFS 39/80  COGNITION: Overall cognitive status: Within functional limits for tasks assessed     SENSATION: WFL  EDEMA:  None observed, none reported    PALPATION: Pt reports no tenderness of left hip, assess OP next session  LOWER EXTREMITY ROM:  Active ROM Right eval Left eval  Hip flexion 102 100  Hip extension 5/20: 5 5/20: 0  Hip abduction    Hip adduction    Hip internal rotation    Hip external rotation    Knee flexion    Knee extension    Ankle dorsiflexion    Ankle plantarflexion    Ankle inversion    Ankle eversion     (Blank rows = not tested)  LOWER EXTREMITY MMT:  MMT Right eval Left eval  Hip flexion 4 3+  Hip extension 4 3+  Hip abduction 4 3+  Hip adduction 4 3+  Hip internal rotation    Hip external rotation    Knee flexion    Knee extension    Ankle dorsiflexion    Ankle plantarflexion    Ankle inversion    Ankle eversion     (Blank rows = not tested)   FUNCTIONAL TESTS:  5 times sit to stand: 16.05 2 minute walk test: 182 feet  04/27/24:  Functional Gait Assessment Summary completed with RW 1. GAIT LEVEL SURFACE: Moderate  impairment -- gait level surface (1) (1 points) 2. CHANGE IN GAIT SPEED: Moderate impairment -- change in gait speed (1) (1 points) 3. GAIT WITH HORIZONTAL HEAD TURNS: Moderate impairment -- gait with horizontal head turns (1) (1 points) 4. GAIT WITH VERTICAL HEAD TURNS: Moderate impairment -- gait with vertical head turns (1) (1 points) 5. GAIT AND PIVOT TURN: Moderate impairment -- gait and pivot turn (1) (1 points) 6. STEP OVER OBSTACLE: Moderate impairment -- step over obstacle (1) (1 points) 7. GAIT WITH NARROW BASE OF SUPPORT: Moderate impairment -- gait with narrow base of support (1) (1 points) 8. GAIT WITH EYES CLOSED: Severe impairment -- gait with eyes closed (0) (0 points) 9. AMBULATING BACKWARDS: Moderate impairment -- ambulating  backwards (1) (1 points) 10. STEPS: Mild impairment -- up and down steps (2) (2 points) Functional Gait Assessment: 10/30=33.3 percent. (Normal for age >20/30)   GAIT: Distance walked: 182 feet Assistive device utilized: Walker - 2 wheeled Level of assistance: Modified independence Comments: Pt demonstrates decreased stride length and stance time on left side.                                                                                                                                TREATMENT DATE:  05/10/24 Nustep seat 9 level 2 dynamic warm up Standing: Heel raises on incline 2 x 10 Toe raises on decline 2 x 10 Marching 2# each leg 2 x 10 Hip extension 2# each leg 2 x 10 Hip abduction 2# each leg 2 x 10 Seated: LAQ's 2# 2 x 10 each // bars Step over low hurdles x 3 down and back x 3 with 1 UE assist Standing on foam beam x 30" no UE assist Standing on foam beam with head turns x 10 with CGA no UE assist Sit to stand 2 x 5 hands on thighs   05/07/24 Standing in // bars Heel raises 2 x 10 Toe raises 2 x 10 Marching 2 x 10 Sidestepping down and back x 3 Squat 2 x 10 holding to bars chair for target Stagger stance 2 x 30" each Hip  extension 2 x 10 Hip abduction 2 x 10 Walking down and back x 2 using 1 hand to simulate 1 hand on AD; progression to cane Nustep to end treatment seat 9 x 5'  04/28/24: Standing: - Heel raises 10x - Toe raises 10x - Marching alternating with HHA 10x 5"  - Abduction with HHA (cueing to reduce ER - Tandem stance 2x 30" - Sidestep with RTB around thigh 4RT inside // bars - Squats 10x front of chair Sit to stand - 10x with elevated surface - 10x with standard height (4 episodes with posterior lean required min A)  04/27/24 Goal review Supine:  bridge 10X  Clames 10X  SLR 10X each LE Prone:  hip extension ROM testing Rt:5 degrees  Lt: to neutral only Stair negotiation 4" with bil HR  Functional Gait Assessment Summary 1. GAIT LEVEL SURFACE: Moderate impairment -- gait level surface (1) (1 points) 2. CHANGE IN GAIT SPEED: Moderate impairment -- change in gait speed (1) (1 points) 3. GAIT WITH HORIZONTAL HEAD TURNS: Moderate impairment -- gait with horizontal head turns (1) (1 points) 4. GAIT WITH VERTICAL HEAD TURNS: Moderate impairment -- gait with vertical head turns (1) (1 points) 5. GAIT AND PIVOT TURN: Moderate impairment -- gait and pivot turn (1) (1 points) 6. STEP OVER OBSTACLE: Moderate impairment -- step over obstacle (1) (1 points) 7. GAIT WITH NARROW BASE OF SUPPORT: Moderate impairment -- gait with narrow base of support (1) (1 points) 8. GAIT WITH EYES CLOSED: Severe impairment -- gait with eyes closed (0) (  0 points) 9. AMBULATING BACKWARDS: Moderate impairment -- ambulating backwards (1) (1 points) 10. STEPS: Mild impairment -- up and down steps (2) (2 points) Functional Gait Assessment: 10/30=33.3 percent. (Normal for age >20/30)  04/21/2024  Evaluation: -ROM measured, Strength assessed, HEP prescribed, pt educated on prognosis, findings, and importance of HEP compliance if given.   PATIENT EDUCATION:  Education details: Pt was educated on findings of PT  evaluation, prognosis, frequency of therapy visits and rationale, attendance policy, and HEP if given.   Person educated: Patient Education method: Explanation, Verbal cues, and Handouts Education comprehension: verbalized understanding, verbal cues required, tactile cues required, and needs further education  HOME EXERCISE PROGRAM: Access Code: [email or web address] URL: [email or web address] Date: 04/21/2024 Prepared by: Armond Bertin  Exercises - Supine Bridge  - 1 x daily - 7 x weekly - 3 sets - 10 reps - Sit to Stand Without Arm Support  - 1 x daily - 7 x weekly - 3 sets - 10 reps - Clamshell  - 1 x daily - 7 x weekly - 3 sets - 10 reps  ASSESSMENT:  CLINICAL IMPRESSION: Continued with  focus with LE strengthening and balance training. Patient using RW to walk in the PT gym and needs cues to improve heel strike and toe off on left foot to lengthen her step to more normal length. Progressed heel and toe raises by using uneven surfaces incline and decline.  Increased Nustep level to 2 without issue.  Patient needs cues to control her descent with stand to sit transfer. Patient also needs cues with hip exercises to avoid using momentum for movement.  Added stepping over hurdles with patient knocking over 2 of them during exercise but no loss of balance.  Most challenge with standing on foam beam today.   Patient will benefit from continued skilled therapy services to address deficits and promote return to optimal function.      Evaluation: Patient is a 83 y.o. female who was seen today for physical therapy evaluation and treatment for S72.002A (ICD-10-CM) - Closed displaced fracture of left femoral neck (HCC).  Patient demonstrates decreased LLE strength, abnormal pain in left hip with prolonged standing and walking, and impaired balance. Patient also demonstrates difficulty with ambulation during today's session with decreased left stride length, stance time, and velocity noted. Patient  also demonstrates decreased quality of movement with bed mobility as it is painful rolling onto left side. Patient requires verbal and tactile cues for HEP prescription and proper form. Patient would benefit from skilled physical therapy for decreased pain and increased endurance with ambulation, increased LE strength, and balance for improved gait quality, return to higher level of function with ADLs, and progress towards therapy goals.   OBJECTIVE IMPAIRMENTS: Abnormal gait, decreased activity tolerance, decreased balance, decreased endurance, decreased mobility, difficulty walking, decreased strength, and pain.   ACTIVITY LIMITATIONS: carrying, lifting, bending, standing, squatting, stairs, transfers, bed mobility, and locomotion level  PARTICIPATION LIMITATIONS: meal prep, cleaning, laundry, driving, shopping, community activity, and yard work  PERSONAL FACTORS: Age, Fitness, Past/current experiences, and 1 comorbidity: recent fall and hip fx are also affecting patient's functional outcome.   REHAB POTENTIAL: Good  CLINICAL DECISION MAKING: Stable/uncomplicated  EVALUATION COMPLEXITY: Low   GOALS: Goals reviewed with patient? Yes  SHORT TERM GOALS: Target date: 05/12/24  Patient will demonstrate evidence of independence with individualized HEP and will report compliance for at least 3 days per week for optimized progression towards remaining therapy goals. Baseline:  Goal status: IN PROGRESS  2.  Patient will report a decrease in pain level during community ambulation by at least 2 points for improved quality of life. Baseline: 2/10 Goal status: IN PROGRESS     LONG TERM GOALS: Target date: 06/02/24  Pt will demonstrate a an increase of at least 9 points on the LEFS for improved performance of community ambulation and ADL. Baseline: see above Goal status: IN PROGRESS  2.  Pt will improve 2 MWT by 140  in order to demonstrate improved functional ambulatory capacity in  community setting.  Baseline: see above Goal status: IN PROGRESS  3.  Pt will demonstrate WFL pain free ROM in left hip, for increased mobility and maximal efficiency of gait cycle during ambulation. Baseline: see objective Goal status: IN PROGRESS  4.  Pt will demonstrate at least 4/5 MMT for left lower extremity for increased strength during ADL and community ambulation. Baseline: see objective Goal status: IN PROGRESS  5.  Pt will improve 5TSTS by at least 2.3 seconds in order to improve functional strength during functional transfers. Baseline: see above Goal status: IN PROGRESS    PLAN:  PT FREQUENCY: 1-2x/week  PT DURATION: 6 weeks  PLANNED INTERVENTIONS: [id number]-Therapeutic exercises, 97530- Therapeutic activity, [id number]- Neuromuscular re-education, 97535- Self Care, [id number]- Manual therapy, [phone]- Gait training, Patient/Family education, Balance training, Stair training, Joint mobilization, DME instructions, Cryotherapy, and Moist heat  PLAN FOR NEXT SESSION: Progress functional strengthening and balance. Returns to [facility] 05/24/24   12:24 PM, 05/10/24 Olof Marcil Small Zylan Almquist MPT [facility] [place] [phone]

## 2024-05-13 ENCOUNTER — Encounter (HOSPITAL_COMMUNITY): Payer: Self-pay

## 2024-05-13 ENCOUNTER — Ambulatory Visit (HOSPITAL_COMMUNITY)

## 2024-05-13 DIAGNOSIS — Z7409 Other reduced mobility: Secondary | ICD-10-CM

## 2024-05-13 DIAGNOSIS — R29898 Other symptoms and signs involving the musculoskeletal system: Secondary | ICD-10-CM | POA: Diagnosis not present

## 2024-05-13 DIAGNOSIS — M25552 Pain in left hip: Secondary | ICD-10-CM | POA: Diagnosis not present

## 2024-05-13 NOTE — Therapy (Signed)
 OUTPATIENT PHYSICAL THERAPY TREATMENT   Patient Name: Tiffany Everett MRN: [id number] DOB:[birth year], 83 y.o., female Today's Date: 05/13/2024  END OF SESSION:  PT End of Session - 05/13/24 1140     Visit Number 6    Number of Visits 12    Date for PT Re-Evaluation 06/02/24    Authorization Type [employer] MEDICARE HMO/PPO    Progress Note Due on Visit 10    PT Start Time 1142    PT Stop Time 1227    PT Time Calculation (min) 45 min    Equipment Utilized During Treatment Gait belt    Activity Tolerance Patient tolerated treatment well    Behavior During Therapy WFL for tasks assessed/performed              Past Medical History:  Diagnosis Date   Colon polyps    Hypertension    Seasonal allergies    Past Surgical History:  Procedure Laterality Date   APPENDECTOMY     CATARACT EXTRACTION W/PHACO Right 09/13/2013   Procedure: CATARACT EXTRACTION PHACO AND INTRAOCULAR LENS PLACEMENT (IOC);  Surgeon: Clay Cummins, MD;  Location: [facility];  Service: Ophthalmology;  Laterality: Right;  CDE:3.25   CATARACT EXTRACTION W/PHACO Left 11/22/2013   Procedure: CATARACT EXTRACTION PHACO AND INTRAOCULAR LENS PLACEMENT (IOC);  Surgeon: Clay Cummins, MD;  Location: [facility];  Service: Ophthalmology;  Laterality: Left;  CDE: 3.95   CESAREAN SECTION     x2   COLONOSCOPY     SLF 2010: sigmoid diverticula, otherwise wnl. Repeat 5 yrs.   HIP ARTHROPLASTY Left 02/25/2024   Procedure: HEMIARTHROPLASTY (BIPOLAR) HIP;  Surgeon: Darrin Emerald, MD;  Location: [facility];  Service: Orthopedics;  Laterality: Left;   I & D EXTREMITY Left    lateral wrist   LAPAROTOMY     oopherectomy   ORIF HUMERUS FRACTURE Left    Patient Active Problem List   Diagnosis Date Noted   Neurocognitive deficits 03/02/2024   Gastroesophageal reflux disease 02/27/2024   Closed displaced fracture of left femoral neck (HCC) 02/24/2024   Chronic kidney disease (CKD) stage G3a/A1, moderately decreased glomerular  filtration rate (GFR) between 45-59 mL/min/1.73 square meter and albuminuria creatinine ratio less than 30 mg/g (HCC) 05/23/2023   Left renal atrophy 05/08/2022   Enlarged thyroid  11/07/2021   Fatigue 04/23/2021   Irregular heartbeat 04/23/2021   Mixed hyperlipidemia 04/23/2021   Vitamin D  insufficiency 04/23/2021   Borderline prolonged QT interval 02/02/2020   Polycythemia 02/01/2020   Essential hypertension    History of colonic polyps 04/24/2015    PCP: Lorre Rosin, NP   REFERRING PROVIDER: Darrin Emerald, MD  REFERRING DIAG: [phone] (ICD-10-CM) - Closed displaced fracture of left femoral neck (HCC)  THERAPY DIAG:  Pain in left hip  Weakness of left lower extremity  Impaired functional mobility, balance, gait, and endurance  Rationale for Evaluation and Treatment: Rehabilitation  ONSET DATE: Early this year, does not remember date  SUBJECTIVE:   SUBJECTIVE STATEMENT: Feeling good today, no reports of pain or recent falls.  Wishes to be able to walk without AD.     Evaluation: Pt states getting up early in the morning got 3/4 to the bathroom and all of a sudden left leg collapsed and fell on left side, felt immediate pain. Pt had surgery for hip fx and then went to a rehab for 2 weeks. Pt then had a couple of weeks of home PT after that. Pt states that she is not in  pain and states the hip does not hurt much anymore just feels weak on that side. Before the fall she was not using a walker and was driving.  PERTINENT HISTORY: Hip Fx PAIN:  Are you having pain? No and Yes: NPRS scale: 3/10 Pain location: Lt hip Pain description: Sharp Aggravating factors: Positioning Relieving factors: Rearrange positioning  PRECAUTIONS: Fall  RED FLAGS: None   WEIGHT BEARING RESTRICTIONS: No  FALLS:  Has patient fallen in last 6 months? Yes. Number of falls 1  LIVING ENVIRONMENT: Lives with: lives with their family and lives with their son Lives in: Mobile  home Stairs: ramped entrance Has following equipment at home: Single point cane, Environmental consultant - 2 wheeled, shower chair, and Ramped entry  OCCUPATION: retired  PLOF: Independent  PATIENT GOALS: To get back where she was; walk without the walker, get stronger left lower extremity. Pt would like to return to yard work and driving as well.  NEXT MD VISIT: maybe next Friday   OBJECTIVE:  Note: Objective measures were completed at Evaluation unless otherwise noted.  DIAGNOSTIC FINDINGS: CLINICAL DATA:  Fall and left hip pain.   EXAM: DG HIP (WITH OR WITHOUT PELVIS) 2-3V LEFT   COMPARISON:  Pelvic radiograph dated 02/01/2020.   FINDINGS: There is a mildly displaced and impacted fracture of the left femoral neck. No dislocation. The bones are osteopenic. The soft tissues are unremarkable.   IMPRESSION: Mildly displaced and impacted fracture of the left femoral neck.  PATIENT SURVEYS:  LEFS 39/80  COGNITION: Overall cognitive status: Within functional limits for tasks assessed     SENSATION: WFL  EDEMA:  None observed, none reported    PALPATION: Pt reports no tenderness of left hip, assess OP next session  LOWER EXTREMITY ROM:  Active ROM Right eval Left eval  Hip flexion 102 100  Hip extension 5/20: 5 5/20: 0  Hip abduction    Hip adduction    Hip internal rotation    Hip external rotation    Knee flexion    Knee extension    Ankle dorsiflexion    Ankle plantarflexion    Ankle inversion    Ankle eversion     (Blank rows = not tested)  LOWER EXTREMITY MMT:  MMT Right eval Left eval  Hip flexion 4 3+  Hip extension 4 3+  Hip abduction 4 3+  Hip adduction 4 3+  Hip internal rotation    Hip external rotation    Knee flexion    Knee extension    Ankle dorsiflexion    Ankle plantarflexion    Ankle inversion    Ankle eversion     (Blank rows = not tested)   FUNCTIONAL TESTS:  5 times sit to stand: 16.05 2 minute walk test: 182 feet  04/27/24:   Functional Gait Assessment Summary completed with RW 1. GAIT LEVEL SURFACE: Moderate impairment -- gait level surface (1) (1 points) 2. CHANGE IN GAIT SPEED: Moderate impairment -- change in gait speed (1) (1 points) 3. GAIT WITH HORIZONTAL HEAD TURNS: Moderate impairment -- gait with horizontal head turns (1) (1 points) 4. GAIT WITH VERTICAL HEAD TURNS: Moderate impairment -- gait with vertical head turns (1) (1 points) 5. GAIT AND PIVOT TURN: Moderate impairment -- gait and pivot turn (1) (1 points) 6. STEP OVER OBSTACLE: Moderate impairment -- step over obstacle (1) (1 points) 7. GAIT WITH NARROW BASE OF SUPPORT: Moderate impairment -- gait with narrow base of support (1) (1 points) 8. GAIT WITH EYES  CLOSED: Severe impairment -- gait with eyes closed (0) (0 points) 9. AMBULATING BACKWARDS: Moderate impairment -- ambulating backwards (1) (1 points) 10. STEPS: Mild impairment -- up and down steps (2) (2 points) Functional Gait Assessment: 10/30=33.3 percent. (Normal for age >20/30)   GAIT: Distance walked: 182 feet Assistive device utilized: Walker - 2 wheeled Level of assistance: Modified independence Comments: Pt demonstrates decreased stride length and stance time on left side.                                                                                                                                TREATMENT DATE:  05/13/24: Gait training with SPC 229ft; able to complete 2 point sequence with 251ft STS 10x with foam STS standard height increased difficulty Heel raises on incline 2 x 10 Toe raises on decline 2 x 10 Toe tapping 6in 20x with 1 HHA 6in hurdles step over then back same leg 10x with 1 UE A Side step GTB around thigh 3RT inside //bars Vector stance 3x 5" with HHA Squat front of chair 10x  05/10/24 Nustep seat 9 level 2 dynamic warm up Standing: Heel raises on incline 2 x 10 Toe raises on decline 2 x 10 Marching 2# each leg 2 x 10 Hip extension 2# each leg 2  x 10 Hip abduction 2# each leg 2 x 10 Seated: LAQ's 2# 2 x 10 each // bars Step over low hurdles x 3 down and back x 3 with 1 UE assist Standing on foam beam x 30" no UE assist Standing on foam beam with head turns x 10 with CGA no UE assist Sit to stand 2 x 5 hands on thighs   05/07/24 Standing in // bars Heel raises 2 x 10 Toe raises 2 x 10 Marching 2 x 10 Sidestepping down and back x 3 Squat 2 x 10 holding to bars chair for target Stagger stance 2 x 30" each Hip extension 2 x 10 Hip abduction 2 x 10 Walking down and back x 2 using 1 hand to simulate 1 hand on AD; progression to cane Nustep to end treatment seat 9 x 5'  04/28/24: Standing: - Heel raises 10x - Toe raises 10x - Marching alternating with HHA 10x 5"  - Abduction with HHA (cueing to reduce ER - Tandem stance 2x 30" - Sidestep with RTB around thigh 4RT inside // bars - Squats 10x front of chair Sit to stand - 10x with elevated surface - 10x with standard height (4 episodes with posterior lean required min A)  04/27/24 Goal review Supine:  bridge 10X  Clames 10X  SLR 10X each LE Prone:  hip extension ROM testing Rt:5 degrees  Lt: to neutral only Stair negotiation 4" with bil HR  Functional Gait Assessment Summary 1. GAIT LEVEL SURFACE: Moderate impairment -- gait level surface (1) (1 points) 2. CHANGE IN GAIT SPEED: Moderate impairment -- change in gait speed (1) (  1 points) 3. GAIT WITH HORIZONTAL HEAD TURNS: Moderate impairment -- gait with horizontal head turns (1) (1 points) 4. GAIT WITH VERTICAL HEAD TURNS: Moderate impairment -- gait with vertical head turns (1) (1 points) 5. GAIT AND PIVOT TURN: Moderate impairment -- gait and pivot turn (1) (1 points) 6. STEP OVER OBSTACLE: Moderate impairment -- step over obstacle (1) (1 points) 7. GAIT WITH NARROW BASE OF SUPPORT: Moderate impairment -- gait with narrow base of support (1) (1 points) 8. GAIT WITH EYES CLOSED: Severe impairment -- gait with eyes  closed (0) (0 points) 9. AMBULATING BACKWARDS: Moderate impairment -- ambulating backwards (1) (1 points) 10. STEPS: Mild impairment -- up and down steps (2) (2 points) Functional Gait Assessment: 10/30=33.3 percent. (Normal for age >20/30)  04/21/2024  Evaluation: -ROM measured, Strength assessed, HEP prescribed, pt educated on prognosis, findings, and importance of HEP compliance if given.   PATIENT EDUCATION:  Education details: Pt was educated on findings of PT evaluation, prognosis, frequency of therapy visits and rationale, attendance policy, and HEP if given.   Person educated: Patient Education method: Explanation, Verbal cues, and Handouts Education comprehension: verbalized understanding, verbal cues required, tactile cues required, and needs further education  HOME EXERCISE PROGRAM: Access Code: [email or web address] URL: [email or web address] Date: 04/21/2024 Prepared by: Armond Bertin  Exercises - Supine Bridge  - 1 x daily - 7 x weekly - 3 sets - 10 reps - Sit to Stand Without Arm Support  - 1 x daily - 7 x weekly - 3 sets - 10 reps - Clamshell  - 1 x daily - 7 x weekly - 3 sets - 10 reps  ASSESSMENT:  CLINICAL IMPRESSION: Session focus with LE strengthening and balance training to improve SLS.  Began gait training with SPC with min A, pt able to demonstrate 2 point sequence following initial cueing.  Pt demonstrated Rt hip drop during Lt stance and Rt swing phase due to weakness.  Increased focus with SLS based activities, pt required 1 UE with all activities with Lt LE weight bearing.  Pt tolerated well to session with no reports of increased pain, was limited mainly by fatigue with 2 seated rest breaks through session.    Evaluation: Patient is a 83 y.o. female who was seen today for physical therapy evaluation and treatment for S72.002A (ICD-10-CM) - Closed displaced fracture of left femoral neck (HCC).  Patient demonstrates decreased LLE strength, abnormal pain in  left hip with prolonged standing and walking, and impaired balance. Patient also demonstrates difficulty with ambulation during today's session with decreased left stride length, stance time, and velocity noted. Patient also demonstrates decreased quality of movement with bed mobility as it is painful rolling onto left side. Patient requires verbal and tactile cues for HEP prescription and proper form. Patient would benefit from skilled physical therapy for decreased pain and increased endurance with ambulation, increased LE strength, and balance for improved gait quality, return to higher level of function with ADLs, and progress towards therapy goals.   OBJECTIVE IMPAIRMENTS: Abnormal gait, decreased activity tolerance, decreased balance, decreased endurance, decreased mobility, difficulty walking, decreased strength, and pain.   ACTIVITY LIMITATIONS: carrying, lifting, bending, standing, squatting, stairs, transfers, bed mobility, and locomotion level  PARTICIPATION LIMITATIONS: meal prep, cleaning, laundry, driving, shopping, community activity, and yard work  PERSONAL FACTORS: Age, Fitness, Past/current experiences, and 1 comorbidity: recent fall and hip fx are also affecting patient's functional outcome.   REHAB POTENTIAL: Good  CLINICAL DECISION MAKING: Stable/uncomplicated  EVALUATION COMPLEXITY: Low  GOALS: Goals reviewed with patient? Yes  SHORT TERM GOALS: Target date: 05/12/24  Patient will demonstrate evidence of independence with individualized HEP and will report compliance for at least 3 days per week for optimized progression towards remaining therapy goals. Baseline:  Goal status: IN PROGRESS  2.  Patient will report a decrease in pain level during community ambulation by at least 2 points for improved quality of life. Baseline: 2/10 Goal status: IN PROGRESS     LONG TERM GOALS: Target date: 06/02/24  Pt will demonstrate a an increase of at least 9 points on the  LEFS for improved performance of community ambulation and ADL. Baseline: see above Goal status: IN PROGRESS  2.  Pt will improve 2 MWT by 140  in order to demonstrate improved functional ambulatory capacity in community setting.  Baseline: see above Goal status: IN PROGRESS  3.  Pt will demonstrate WFL pain free ROM in left hip, for increased mobility and maximal efficiency of gait cycle during ambulation. Baseline: see objective Goal status: IN PROGRESS  4.  Pt will demonstrate at least 4/5 MMT for left lower extremity for increased strength during ADL and community ambulation. Baseline: see objective Goal status: IN PROGRESS  5.  Pt will improve 5TSTS by at least 2.3 seconds in order to improve functional strength during functional transfers. Baseline: see above Goal status: IN PROGRESS    PLAN:  PT FREQUENCY: 1-2x/week  PT DURATION: 6 weeks  PLANNED INTERVENTIONS: 97110-Therapeutic exercises, 97530- Therapeutic activity, [id number]- Neuromuscular re-education, 97535- Self Care, 10272- Manual therapy, [phone]- Gait training, Patient/Family education, Balance training, Stair training, Joint mobilization, DME instructions, Cryotherapy, and Moist heat  PLAN FOR NEXT SESSION: Progress functional strengthening and balance. Returns to [facility] 05/24/24.  Minor Amble, LPTA/CLT; CBIS [phone]  12:41 PM, 05/13/24

## 2024-05-17 ENCOUNTER — Encounter (HOSPITAL_COMMUNITY): Admitting: Physical Therapy

## 2024-05-18 ENCOUNTER — Ambulatory Visit (HOSPITAL_COMMUNITY)

## 2024-05-18 DIAGNOSIS — R9431 Abnormal electrocardiogram [ECG] [EKG]: Secondary | ICD-10-CM | POA: Diagnosis not present

## 2024-05-18 DIAGNOSIS — R29898 Other symptoms and signs involving the musculoskeletal system: Secondary | ICD-10-CM

## 2024-05-18 DIAGNOSIS — Z7409 Other reduced mobility: Secondary | ICD-10-CM | POA: Diagnosis not present

## 2024-05-18 DIAGNOSIS — M25552 Pain in left hip: Secondary | ICD-10-CM

## 2024-05-18 NOTE — Therapy (Signed)
 OUTPATIENT PHYSICAL THERAPY TREATMENT   Patient Name: Tiffany Everett MRN: [id number] DOB:06-27-1941, 83 y.o., female Today's Date: 05/18/2024  END OF SESSION:  PT End of Session - 05/18/24 1142     Visit Number 7    Number of Visits 12    Date for PT Re-Evaluation 06/02/24    Authorization Type [employer] MEDICARE HMO/PPO    Progress Note Due on Visit 10    PT Start Time 1141    PT Stop Time 1221    PT Time Calculation (min) 40 min    Equipment Utilized During Treatment Gait belt    Activity Tolerance Patient tolerated treatment well    Behavior During Therapy WFL for tasks assessed/performed              Past Medical History:  Diagnosis Date   Colon polyps    Hypertension    Seasonal allergies    Past Surgical History:  Procedure Laterality Date   APPENDECTOMY     CATARACT EXTRACTION W/PHACO Right 09/13/2013   Procedure: CATARACT EXTRACTION PHACO AND INTRAOCULAR LENS PLACEMENT (IOC);  Surgeon: Clay Cummins, MD;  Location: [facility];  Service: Ophthalmology;  Laterality: Right;  CDE:3.25   CATARACT EXTRACTION W/PHACO Left 11/22/2013   Procedure: CATARACT EXTRACTION PHACO AND INTRAOCULAR LENS PLACEMENT (IOC);  Surgeon: Clay Cummins, MD;  Location: [facility];  Service: Ophthalmology;  Laterality: Left;  CDE: 3.95   CESAREAN SECTION     x2   COLONOSCOPY     SLF 2010: sigmoid diverticula, otherwise wnl. Repeat 5 yrs.   HIP ARTHROPLASTY Left 02/25/2024   Procedure: HEMIARTHROPLASTY (BIPOLAR) HIP;  Surgeon: Darrin Emerald, MD;  Location: [facility];  Service: Orthopedics;  Laterality: Left;   I & D EXTREMITY Left    lateral wrist   LAPAROTOMY     oopherectomy   ORIF HUMERUS FRACTURE Left    Patient Active Problem List   Diagnosis Date Noted   Neurocognitive deficits 03/02/2024   Gastroesophageal reflux disease 02/27/2024   Closed displaced fracture of left femoral neck (HCC) 02/24/2024   Chronic kidney disease (CKD) stage G3a/A1, moderately decreased glomerular  filtration rate (GFR) between 45-59 mL/min/1.73 square meter and albuminuria creatinine ratio less than 30 mg/g (HCC) 05/23/2023   Left renal atrophy 05/08/2022   Enlarged thyroid  11/07/2021   Fatigue 04/23/2021   Irregular heartbeat 04/23/2021   Mixed hyperlipidemia 04/23/2021   Vitamin D  insufficiency 04/23/2021   Borderline prolonged QT interval 02/02/2020   Polycythemia 02/01/2020   Essential hypertension    History of colonic polyps 04/24/2015    PCP: Lorre Rosin, NP   REFERRING PROVIDER: Darrin Emerald, MD  REFERRING DIAG: [id number] (ICD-10-CM) - Closed displaced fracture of left femoral neck (HCC)  THERAPY DIAG:  Pain in left hip  Weakness of left lower extremity  Impaired functional mobility, balance, gait, and endurance  Rationale for Evaluation and Treatment: Rehabilitation  ONSET DATE: Early this year, does not remember date  SUBJECTIVE:   SUBJECTIVE STATEMENT: Sees Dr. Phyllis Breeze next week.  No pain or new falls reported.     Evaluation: Pt states getting up early in the morning got 3/4 to the bathroom and all of a sudden left leg collapsed and fell on left side, felt immediate pain. Pt had surgery for hip fx and then went to a rehab for 2 weeks. Pt then had a couple of weeks of home PT after that. Pt states that she is not in pain and states the hip does not  hurt much anymore just feels weak on that side. Before the fall she was not using a walker and was driving.  PERTINENT HISTORY: Hip Fx PAIN:  Are you having pain? No and Yes: NPRS scale: 3/10 Pain location: Lt hip Pain description: Sharp Aggravating factors: Positioning Relieving factors: Rearrange positioning  PRECAUTIONS: Fall  RED FLAGS: None   WEIGHT BEARING RESTRICTIONS: No  FALLS:  Has patient fallen in last 6 months? Yes. Number of falls 1  LIVING ENVIRONMENT: Lives with: lives with their family and lives with their son Lives in: Mobile home Stairs: ramped entrance Has  following equipment at home: Single point cane, Environmental consultant - 2 wheeled, shower chair, and Ramped entry  OCCUPATION: retired  PLOF: Independent  PATIENT GOALS: To get back where she was; walk without the walker, get stronger left lower extremity. Pt would like to return to yard work and driving as well.  NEXT MD VISIT: maybe next Friday   OBJECTIVE:  Note: Objective measures were completed at Evaluation unless otherwise noted.  DIAGNOSTIC FINDINGS: CLINICAL DATA:  Fall and left hip pain.   EXAM: DG HIP (WITH OR WITHOUT PELVIS) 2-3V LEFT   COMPARISON:  Pelvic radiograph dated 02/01/2020.   FINDINGS: There is a mildly displaced and impacted fracture of the left femoral neck. No dislocation. The bones are osteopenic. The soft tissues are unremarkable.   IMPRESSION: Mildly displaced and impacted fracture of the left femoral neck.  PATIENT SURVEYS:  LEFS 39/80  COGNITION: Overall cognitive status: Within functional limits for tasks assessed     SENSATION: WFL  EDEMA:  None observed, none reported    PALPATION: Pt reports no tenderness of left hip, assess OP next session  LOWER EXTREMITY ROM:  Active ROM Right eval Left eval  Hip flexion 102 100  Hip extension 5/20: 5 5/20: 0  Hip abduction    Hip adduction    Hip internal rotation    Hip external rotation    Knee flexion    Knee extension    Ankle dorsiflexion    Ankle plantarflexion    Ankle inversion    Ankle eversion     (Blank rows = not tested)  LOWER EXTREMITY MMT:  MMT Right eval Left eval  Hip flexion 4 3+  Hip extension 4 3+  Hip abduction 4 3+  Hip adduction 4 3+  Hip internal rotation    Hip external rotation    Knee flexion    Knee extension    Ankle dorsiflexion    Ankle plantarflexion    Ankle inversion    Ankle eversion     (Blank rows = not tested)   FUNCTIONAL TESTS:  5 times sit to stand: 16.05 2 minute walk test: 182 feet  04/27/24:  Functional Gait Assessment Summary  completed with RW 1. GAIT LEVEL SURFACE: Moderate impairment -- gait level surface (1) (1 points) 2. CHANGE IN GAIT SPEED: Moderate impairment -- change in gait speed (1) (1 points) 3. GAIT WITH HORIZONTAL HEAD TURNS: Moderate impairment -- gait with horizontal head turns (1) (1 points) 4. GAIT WITH VERTICAL HEAD TURNS: Moderate impairment -- gait with vertical head turns (1) (1 points) 5. GAIT AND PIVOT TURN: Moderate impairment -- gait and pivot turn (1) (1 points) 6. STEP OVER OBSTACLE: Moderate impairment -- step over obstacle (1) (1 points) 7. GAIT WITH NARROW BASE OF SUPPORT: Moderate impairment -- gait with narrow base of support (1) (1 points) 8. GAIT WITH EYES CLOSED: Severe impairment -- gait with eyes  closed (0) (0 points) 9. AMBULATING BACKWARDS: Moderate impairment -- ambulating backwards (1) (1 points) 10. STEPS: Mild impairment -- up and down steps (2) (2 points) Functional Gait Assessment: 10/30=33.3 percent. (Normal for age >20/30)   GAIT: Distance walked: 182 feet Assistive device utilized: Walker - 2 wheeled Level of assistance: Modified independence Comments: Pt demonstrates decreased stride length and stance time on left side.                                                                                                                                TREATMENT DATE:  05/18/24 Sit to stand no UE assist x 10; tends to lean posteriorly; needs min A 2 times to recover safely Gait training in PT gym x 1 lap with SPC and CGA Green theraband around knees: sidestepping x 10 each leg Hip flexion x 10 each Hip extension x 10 each Hip abduction x 10 each Standing: 4" box step ups 2 x 10 left leading 4" box lateral step ups left 2 x 10 Sit to stand holding red med ball Tandem stance 2 x 20" each 4" box toe taps; unable to tap right toe without UE support x 10 each Updated HEP     05/13/24: Gait training with SPC 234ft; able to complete 2 point sequence with  219ft STS 10x with foam STS standard height increased difficulty Heel raises on incline 2 x 10 Toe raises on decline 2 x 10 Toe tapping 6in 20x with 1 HHA 6in hurdles step over then back same leg 10x with 1 UE A Side step GTB around thigh 3RT inside //bars Vector stance 3x 5" with HHA Squat front of chair 10x  05/10/24 Nustep seat 9 level 2 dynamic warm up Standing: Heel raises on incline 2 x 10 Toe raises on decline 2 x 10 Marching 2# each leg 2 x 10 Hip extension 2# each leg 2 x 10 Hip abduction 2# each leg 2 x 10 Seated: LAQ's 2# 2 x 10 each // bars Step over low hurdles x 3 down and back x 3 with 1 UE assist Standing on foam beam x 30" no UE assist Standing on foam beam with head turns x 10 with CGA no UE assist Sit to stand 2 x 5 hands on thighs   05/07/24 Standing in // bars Heel raises 2 x 10 Toe raises 2 x 10 Marching 2 x 10 Sidestepping down and back x 3 Squat 2 x 10 holding to bars chair for target Stagger stance 2 x 30" each Hip extension 2 x 10 Hip abduction 2 x 10 Walking down and back x 2 using 1 hand to simulate 1 hand on AD; progression to cane Nustep to end treatment seat 9 x 5'  04/28/24: Standing: - Heel raises 10x - Toe raises 10x - Marching alternating with HHA 10x 5"  - Abduction with HHA (cueing to reduce ER - Tandem stance 2x 30" -  Sidestep with RTB around thigh 4RT inside // bars - Squats 10x front of chair Sit to stand - 10x with elevated surface - 10x with standard height (4 episodes with posterior lean required min A)  04/27/24 Goal review Supine:  bridge 10X  Clames 10X  SLR 10X each LE Prone:  hip extension ROM testing Rt:5 degrees  Lt: to neutral only Stair negotiation 4" with bil HR  Functional Gait Assessment Summary 1. GAIT LEVEL SURFACE: Moderate impairment -- gait level surface (1) (1 points) 2. CHANGE IN GAIT SPEED: Moderate impairment -- change in gait speed (1) (1 points) 3. GAIT WITH HORIZONTAL HEAD TURNS: Moderate  impairment -- gait with horizontal head turns (1) (1 points) 4. GAIT WITH VERTICAL HEAD TURNS: Moderate impairment -- gait with vertical head turns (1) (1 points) 5. GAIT AND PIVOT TURN: Moderate impairment -- gait and pivot turn (1) (1 points) 6. STEP OVER OBSTACLE: Moderate impairment -- step over obstacle (1) (1 points) 7. GAIT WITH NARROW BASE OF SUPPORT: Moderate impairment -- gait with narrow base of support (1) (1 points) 8. GAIT WITH EYES CLOSED: Severe impairment -- gait with eyes closed (0) (0 points) 9. AMBULATING BACKWARDS: Moderate impairment -- ambulating backwards (1) (1 points) 10. STEPS: Mild impairment -- up and down steps (2) (2 points) Functional Gait Assessment: 10/30=33.3 percent. (Normal for age >20/30)  04/21/2024  Evaluation: -ROM measured, Strength assessed, HEP prescribed, pt educated on prognosis, findings, and importance of HEP compliance if given.   PATIENT EDUCATION:  Education details: Pt was educated on findings of PT evaluation, prognosis, frequency of therapy visits and rationale, attendance policy, and HEP if given.   Person educated: Patient Education method: Explanation, Verbal cues, and Handouts Education comprehension: verbalized understanding, verbal cues required, tactile cues required, and needs further education  HOME EXERCISE PROGRAM: Access Code: [email or web address] URL: [email or web address] Date: 05/18/2024 Prepared by: [facility]  Exercises - Standing Hip Abduction with Resistance at Ankles and Counter Support  - 1 x daily - 7 x weekly - 2 sets - 10 reps - Standing Hip Extension with Resistance at Ankles and Counter Support  - 1 x daily - 7 x weekly - 2 sets - 10 reps - Standing March with Unilateral Counter Support  - 1 x daily - 7 x weekly - 2 sets - 10 reps - tandem stance balance; try not to hold on  - 1 x daily - 7 x weekly - 1 sets - 5 reps - 15 sec hold Access Code: [email or web address] URL: [email or web address] Date:  05/18/2024 Prepared by: [facility]  Exercises - Supine Bridge  - 1 x daily - 7 x weekly - 3 sets - 10 reps - Sit to Stand Without Arm Support  - 1 x daily - 7 x weekly - 3 sets - 10 reps - Clamshell  - 1 x daily - 7 x weekly - 3 sets - 10 reps - Standing Hip Abduction with Resistance at Ankles and Counter Support  - 1 x daily - 7 x weekly - 2 sets - 10 reps - Standing Hip Extension with Resistance at Ankles and Counter Support  - 1 x daily - 7 x weekly - 2 sets - 10 reps - Standing March with Unilateral Counter Support  - 1 x daily - 7 x weekly - 2 sets - 10 reps - tandem stance balance; try not to hold on  - 1 x daily - 7 x weekly - 1 sets - 5 reps -  15 sec hold Access Code: [email or web address] URL: [email or web address] Date: 04/21/2024 Prepared by: Armond Bertin  Exercises - Supine Bridge  - 1 x daily - 7 x weekly - 3 sets - 10 reps - Sit to Stand Without Arm Support  - 1 x daily - 7 x weekly - 3 sets - 10 reps - Clamshell  - 1 x daily - 7 x weekly - 3 sets - 10 reps  ASSESSMENT:  CLINICAL IMPRESSION: Today's session with continued focus on lower extremity strengthening, functional movement, gait training and balance. Patient needs repeated and frequent cues throughout session for technique with exercises and often continues with incorrect technique even with demonstration and tactile cues.   Continues with trendelenburg gait and inability to stand on the left leg SLS in order to lift or advance right leg with control or without AD.  Updated HEP today.  Patient will benefit from continued skilled therapy services to address deficits and promote return to optimal function.      Evaluation: Patient is a 83 y.o. female who was seen today for physical therapy evaluation and treatment for S72.002A (ICD-10-CM) - Closed displaced fracture of left femoral neck (HCC).  Patient demonstrates decreased LLE strength, abnormal pain in left hip with prolonged standing and walking, and impaired  balance. Patient also demonstrates difficulty with ambulation during today's session with decreased left stride length, stance time, and velocity noted. Patient also demonstrates decreased quality of movement with bed mobility as it is painful rolling onto left side. Patient requires verbal and tactile cues for HEP prescription and proper form. Patient would benefit from skilled physical therapy for decreased pain and increased endurance with ambulation, increased LE strength, and balance for improved gait quality, return to higher level of function with ADLs, and progress towards therapy goals.   OBJECTIVE IMPAIRMENTS: Abnormal gait, decreased activity tolerance, decreased balance, decreased endurance, decreased mobility, difficulty walking, decreased strength, and pain.   ACTIVITY LIMITATIONS: carrying, lifting, bending, standing, squatting, stairs, transfers, bed mobility, and locomotion level  PARTICIPATION LIMITATIONS: meal prep, cleaning, laundry, driving, shopping, community activity, and yard work  PERSONAL FACTORS: Age, Fitness, Past/current experiences, and 1 comorbidity: recent fall and hip fx are also affecting patient's functional outcome.   REHAB POTENTIAL: Good  CLINICAL DECISION MAKING: Stable/uncomplicated  EVALUATION COMPLEXITY: Low   GOALS: Goals reviewed with patient? Yes  SHORT TERM GOALS: Target date: 05/12/24  Patient will demonstrate evidence of independence with individualized HEP and will report compliance for at least 3 days per week for optimized progression towards remaining therapy goals. Baseline:  Goal status: IN PROGRESS  2.  Patient will report a decrease in pain level during community ambulation by at least 2 points for improved quality of life. Baseline: 2/10 Goal status: IN PROGRESS     LONG TERM GOALS: Target date: 06/02/24  Pt will demonstrate a an increase of at least 9 points on the LEFS for improved performance of community ambulation and  ADL. Baseline: see above Goal status: IN PROGRESS  2.  Pt will improve 2 MWT by 140  in order to demonstrate improved functional ambulatory capacity in community setting.  Baseline: see above Goal status: IN PROGRESS  3.  Pt will demonstrate WFL pain free ROM in left hip, for increased mobility and maximal efficiency of gait cycle during ambulation. Baseline: see objective Goal status: IN PROGRESS  4.  Pt will demonstrate at least 4/5 MMT for left lower extremity for increased strength during ADL and community ambulation. Baseline: see objective Goal  status: IN PROGRESS  5.  Pt will improve 5TSTS by at least 2.3 seconds in order to improve functional strength during functional transfers. Baseline: see above Goal status: IN PROGRESS    PLAN:  PT FREQUENCY: 1-2x/week  PT DURATION: 6 weeks  PLANNED INTERVENTIONS: 97110-Therapeutic exercises, 97530- Therapeutic activity, [id number]- Neuromuscular re-education, 97535- Self Care, [id number]- Manual therapy, [phone]- Gait training, Patient/Family education, Balance training, Stair training, Joint mobilization, DME instructions, Cryotherapy, and Moist heat  PLAN FOR NEXT SESSION: Progress functional strengthening and balance. Returns to [facility] 05/24/24.  12:26 PM, 05/18/24 Mayson Sterbenz Small Aela Bohan MPT [facility] [place] [phone]

## 2024-05-20 ENCOUNTER — Ambulatory Visit (HOSPITAL_COMMUNITY): Admitting: Physical Therapy

## 2024-05-20 DIAGNOSIS — M25552 Pain in left hip: Secondary | ICD-10-CM | POA: Diagnosis not present

## 2024-05-20 DIAGNOSIS — Z7409 Other reduced mobility: Secondary | ICD-10-CM | POA: Diagnosis not present

## 2024-05-20 DIAGNOSIS — R29898 Other symptoms and signs involving the musculoskeletal system: Secondary | ICD-10-CM | POA: Diagnosis not present

## 2024-05-20 NOTE — Therapy (Signed)
 OUTPATIENT PHYSICAL THERAPY TREATMENT   Patient Name: Tiffany Everett MRN: [id number] DOB:November 23, 1941, 83 y.o., female Today's Date: 05/20/2024  END OF SESSION:  PT End of Session - 05/20/24 1142     Visit Number 8    Number of Visits 12    Date for PT Re-Evaluation 06/02/24    Authorization Type [employer] MEDICARE HMO/PPO    Progress Note Due on Visit 10    PT Start Time 1140    PT Stop Time 1225    PT Time Calculation (min) 45 min    Equipment Utilized During Treatment Gait belt    Activity Tolerance Patient tolerated treatment well    Behavior During Therapy WFL for tasks assessed/performed           Past Medical History:  Diagnosis Date   Colon polyps    Hypertension    Seasonal allergies    Past Surgical History:  Procedure Laterality Date   APPENDECTOMY     CATARACT EXTRACTION W/PHACO Right 09/13/2013   Procedure: CATARACT EXTRACTION PHACO AND INTRAOCULAR LENS PLACEMENT (IOC);  Surgeon: Clay Cummins, MD;  Location: [facility];  Service: Ophthalmology;  Laterality: Right;  CDE:3.25   CATARACT EXTRACTION W/PHACO Left 11/22/2013   Procedure: CATARACT EXTRACTION PHACO AND INTRAOCULAR LENS PLACEMENT (IOC);  Surgeon: Clay Cummins, MD;  Location: [facility];  Service: Ophthalmology;  Laterality: Left;  CDE: 3.95   CESAREAN SECTION     x2   COLONOSCOPY     SLF 2010: sigmoid diverticula, otherwise wnl. Repeat 5 yrs.   HIP ARTHROPLASTY Left 02/25/2024   Procedure: HEMIARTHROPLASTY (BIPOLAR) HIP;  Surgeon: Darrin Emerald, MD;  Location: [facility];  Service: Orthopedics;  Laterality: Left;   I & D EXTREMITY Left    lateral wrist   LAPAROTOMY     oopherectomy   ORIF HUMERUS FRACTURE Left    Patient Active Problem List   Diagnosis Date Noted   Neurocognitive deficits 03/02/2024   Gastroesophageal reflux disease 02/27/2024   Closed displaced fracture of left femoral neck (HCC) 02/24/2024   Chronic kidney disease (CKD) stage G3a/A1, moderately decreased glomerular  filtration rate (GFR) between 45-59 mL/min/1.73 square meter and albuminuria creatinine ratio less than 30 mg/g (HCC) 05/23/2023   Left renal atrophy 05/08/2022   Enlarged thyroid  11/07/2021   Fatigue 04/23/2021   Irregular heartbeat 04/23/2021   Mixed hyperlipidemia 04/23/2021   Vitamin D  insufficiency 04/23/2021   Borderline prolonged QT interval 02/02/2020   Polycythemia 02/01/2020   Essential hypertension    History of colonic polyps 04/24/2015    PCP: Lorre Rosin, NP   REFERRING PROVIDER: Darrin Emerald, MD  REFERRING DIAG: [phone] (ICD-10-CM) - Closed displaced fracture of left femoral neck (HCC)  THERAPY DIAG:  Pain in left hip  Weakness of left lower extremity  Impaired functional mobility, balance, gait, and endurance  Rationale for Evaluation and Treatment: Rehabilitation  ONSET DATE: Early this year, does not remember date  SUBJECTIVE:   SUBJECTIVE STATEMENT: Sees Dr. Phyllis Breeze next week.  Only c/o soreness at LT hip flexor/quadricep area.      Evaluation: Pt states getting up early in the morning got 3/4 to the bathroom and all of a sudden left leg collapsed and fell on left side, felt immediate pain. Pt had surgery for hip fx and then went to a rehab for 2 weeks. Pt then had a couple of weeks of home PT after that. Pt states that she is not in pain and states the hip does not  hurt much anymore just feels weak on that side. Before the fall she was not using a walker and was driving.  PERTINENT HISTORY: Hip Fx PAIN:  Are you having pain? No and Yes: NPRS scale: 3/10 Pain location: Lt hip Pain description: Sharp Aggravating factors: Positioning Relieving factors: Rearrange positioning  PRECAUTIONS: Fall  RED FLAGS: None   WEIGHT BEARING RESTRICTIONS: No  FALLS:  Has patient fallen in last 6 months? Yes. Number of falls 1  LIVING ENVIRONMENT: Lives with: lives with their family and lives with their son Lives in: Mobile home Stairs:  ramped entrance Has following equipment at home: Single point cane, Environmental consultant - 2 wheeled, shower chair, and Ramped entry  OCCUPATION: retired  PLOF: Independent  PATIENT GOALS: To get back where she was; walk without the walker, get stronger left lower extremity. Pt would like to return to yard work and driving as well.  NEXT MD VISIT: maybe next Friday   OBJECTIVE:  Note: Objective measures were completed at Evaluation unless otherwise noted.  DIAGNOSTIC FINDINGS: CLINICAL DATA:  Fall and left hip pain.   EXAM: DG HIP (WITH OR WITHOUT PELVIS) 2-3V LEFT   COMPARISON:  Pelvic radiograph dated 02/01/2020.   FINDINGS: There is a mildly displaced and impacted fracture of the left femoral neck. No dislocation. The bones are osteopenic. The soft tissues are unremarkable.   IMPRESSION: Mildly displaced and impacted fracture of the left femoral neck.  PATIENT SURVEYS:  LEFS 39/80  COGNITION: Overall cognitive status: Within functional limits for tasks assessed     SENSATION: WFL  EDEMA:  None observed, none reported    PALPATION: Pt reports no tenderness of left hip, assess OP next session  LOWER EXTREMITY ROM:  Active ROM Right eval Left eval  Hip flexion 102 100  Hip extension 5/20: 5 5/20: 0  Hip abduction    Hip adduction    Hip internal rotation    Hip external rotation    Knee flexion    Knee extension    Ankle dorsiflexion    Ankle plantarflexion    Ankle inversion    Ankle eversion     (Blank rows = not tested)  LOWER EXTREMITY MMT:  MMT Right eval Left eval  Hip flexion 4 3+  Hip extension 4 3+  Hip abduction 4 3+  Hip adduction 4 3+  Hip internal rotation    Hip external rotation    Knee flexion    Knee extension    Ankle dorsiflexion    Ankle plantarflexion    Ankle inversion    Ankle eversion     (Blank rows = not tested)   FUNCTIONAL TESTS:  5 times sit to stand: 16.05 2 minute walk test: 182 feet  04/27/24:  Functional Gait  Assessment Summary completed with RW 1. GAIT LEVEL SURFACE: Moderate impairment -- gait level surface (1) (1 points) 2. CHANGE IN GAIT SPEED: Moderate impairment -- change in gait speed (1) (1 points) 3. GAIT WITH HORIZONTAL HEAD TURNS: Moderate impairment -- gait with horizontal head turns (1) (1 points) 4. GAIT WITH VERTICAL HEAD TURNS: Moderate impairment -- gait with vertical head turns (1) (1 points) 5. GAIT AND PIVOT TURN: Moderate impairment -- gait and pivot turn (1) (1 points) 6. STEP OVER OBSTACLE: Moderate impairment -- step over obstacle (1) (1 points) 7. GAIT WITH NARROW BASE OF SUPPORT: Moderate impairment -- gait with narrow base of support (1) (1 points) 8. GAIT WITH EYES CLOSED: Severe impairment -- gait with eyes  closed (0) (0 points) 9. AMBULATING BACKWARDS: Moderate impairment -- ambulating backwards (1) (1 points) 10. STEPS: Mild impairment -- up and down steps (2) (2 points) Functional Gait Assessment: 10/30=33.3 percent. (Normal for age >20/30)   GAIT: Distance walked: 182 feet Assistive device utilized: Walker - 2 wheeled Level of assistance: Modified independence Comments: Pt demonstrates decreased stride length and stance time on left side.                                                                                                                                TREATMENT DATE:  05/20/24 Standing in // bars, bil UE assist Hip flexion 2x 10 each with GTB around ankles Hip extension 2x 10 each with GTB around ankles Hip abduction 2x 10 each with GTB around ankles Side stepping in // bars 1 UE assist GTB around ankles 2RT 6 box step ups 2 x 10 bilaterally with bil UE assist 6 box lateral step ups left only 2 x 10 bil UE assist, Modified Tandem stance 3X30 Vectors bilateral with 1 UE assist 5X5 each Gait training in PT gym x 1 lap with SPC and CGA  05/18/24 Sit to stand no UE assist x 10; tends to lean posteriorly; needs min A 2 times to recover  safely Gait training in PT gym x 1 lap with SPC and CGA Green theraband around knees: sidestepping x 10 each leg Hip flexion x 10 each Hip extension x 10 each Hip abduction x 10 each Standing: 4 box step ups 2 x 10 left leading 4 box lateral step ups left 2 x 10 Sit to stand holding red med ball Tandem stance 2 x 20 each 4 box toe taps; unable to tap right toe without UE support x 10 each Updated HEP     05/13/24: Gait training with SPC 293ft; able to complete 2 point sequence with 261ft STS 10x with foam STS standard height increased difficulty Heel raises on incline 2 x 10 Toe raises on decline 2 x 10 Toe tapping 6in 20x with 1 HHA 6in hurdles step over then back same leg 10x with 1 UE A Side step GTB around thigh 3RT inside //bars Vector stance 3x 5 with HHA Squat front of chair 10x    PATIENT EDUCATION:  Education details: Pt was educated on findings of PT evaluation, prognosis, frequency of therapy visits and rationale, attendance policy, and HEP if given.   Person educated: Patient Education method: Explanation, Verbal cues, and Handouts Education comprehension: verbalized understanding, verbal cues required, tactile cues required, and needs further education  HOME EXERCISE PROGRAM: Access Code: [email or web address] URL: [email or web address] Date: 05/18/2024 Prepared by: [facility]  Exercises - Standing Hip Abduction with Resistance at Ankles and Counter Support  - 1 x daily - 7 x weekly - 2 sets - 10 reps - Standing Hip Extension with Resistance at Ankles and Counter Support  - 1 x daily - 7 x  weekly - 2 sets - 10 reps - Standing March with Unilateral Counter Support  - 1 x daily - 7 x weekly - 2 sets - 10 reps - tandem stance balance; try not to hold on  - 1 x daily - 7 x weekly - 1 sets - 5 reps - 15 sec hold Access Code: [email or web address] URL: [email or web address] Date: 05/18/2024 Prepared by: [facility]  Exercises - Supine Bridge  -  1 x daily - 7 x weekly - 3 sets - 10 reps - Sit to Stand Without Arm Support  - 1 x daily - 7 x weekly - 3 sets - 10 reps - Clamshell  - 1 x daily - 7 x weekly - 3 sets - 10 reps - Standing Hip Abduction with Resistance at Ankles and Counter Support  - 1 x daily - 7 x weekly - 2 sets - 10 reps - Standing Hip Extension with Resistance at Ankles and Counter Support  - 1 x daily - 7 x weekly - 2 sets - 10 reps - Standing March with Unilateral Counter Support  - 1 x daily - 7 x weekly - 2 sets - 10 reps - tandem stance balance; try not to hold on  - 1 x daily - 7 x weekly - 1 sets - 5 reps - 15 sec hold Access Code: [email or web address] URL: [email or web address] Date: 04/21/2024 Prepared by: Armond Bertin  Exercises - Supine Bridge  - 1 x daily - 7 x weekly - 3 sets - 10 reps - Sit to Stand Without Arm Support  - 1 x daily - 7 x weekly - 3 sets - 10 reps - Clamshell  - 1 x daily - 7 x weekly - 3 sets - 10 reps  ASSESSMENT:  CLINICAL IMPRESSION: Noted pateint pulling up with RW to stand from waiting room when called back.  Educated pt and son to push up from chair rather than pull up on items.  Both verbalized understanding.  PT also continued ot comlete standing correctly throughout session today with minimal cues.  Increased challenge of hip strengthening with placement of band at ankles and increase to 6 step with step ups.  Also added side stepping in parallel bars with use of 1 UE only.  Pt reported this was challenging as well.  Cues for eccentric lowering with lateral step downs. Gait with SPC continued with tendency to advance Rt LE too quickly with slap step with ambulation due to fatigue/weakness.  Tandem remains difficult to maintain and can only acquire the modified tandem position.  Added vectors to improve glut strength and stability. Encouraged increase stance time on Lt and heel to toe gait pattern.  Patient needs repeated and frequent cues throughout session  Patient will benefit  from continued skilled therapy services to address deficits and promote return to optimal function.      Evaluation: Patient is a 83 y.o. female who was seen today for physical therapy evaluation and treatment for S72.002A (ICD-10-CM) - Closed displaced fracture of left femoral neck (HCC).  Patient demonstrates decreased LLE strength, abnormal pain in left hip with prolonged standing and walking, and impaired balance. Patient also demonstrates difficulty with ambulation during today's session with decreased left stride length, stance time, and velocity noted. Patient also demonstrates decreased quality of movement with bed mobility as it is painful rolling onto left side. Patient requires verbal and tactile cues for HEP prescription and proper form. Patient would benefit from skilled physical therapy for decreased pain  and increased endurance with ambulation, increased LE strength, and balance for improved gait quality, return to higher level of function with ADLs, and progress towards therapy goals.   OBJECTIVE IMPAIRMENTS: Abnormal gait, decreased activity tolerance, decreased balance, decreased endurance, decreased mobility, difficulty walking, decreased strength, and pain.   ACTIVITY LIMITATIONS: carrying, lifting, bending, standing, squatting, stairs, transfers, bed mobility, and locomotion level  PARTICIPATION LIMITATIONS: meal prep, cleaning, laundry, driving, shopping, community activity, and yard work  PERSONAL FACTORS: Age, Fitness, Past/current experiences, and 1 comorbidity: recent fall and hip fx are also affecting patient's functional outcome.   REHAB POTENTIAL: Good  CLINICAL DECISION MAKING: Stable/uncomplicated  EVALUATION COMPLEXITY: Low   GOALS: Goals reviewed with patient? Yes  SHORT TERM GOALS: Target date: 05/12/24  Patient will demonstrate evidence of independence with individualized HEP and will report compliance for at least 3 days per week for optimized  progression towards remaining therapy goals. Baseline:  Goal status: IN PROGRESS  2.  Patient will report a decrease in pain level during community ambulation by at least 2 points for improved quality of life. Baseline: 2/10 Goal status: IN PROGRESS     LONG TERM GOALS: Target date: 06/02/24  Pt will demonstrate a an increase of at least 9 points on the LEFS for improved performance of community ambulation and ADL. Baseline: see above Goal status: IN PROGRESS  2.  Pt will improve 2 MWT by 140  in order to demonstrate improved functional ambulatory capacity in community setting.  Baseline: see above Goal status: IN PROGRESS  3.  Pt will demonstrate WFL pain free ROM in left hip, for increased mobility and maximal efficiency of gait cycle during ambulation. Baseline: see objective Goal status: IN PROGRESS  4.  Pt will demonstrate at least 4/5 MMT for left lower extremity for increased strength during ADL and community ambulation. Baseline: see objective Goal status: IN PROGRESS  5.  Pt will improve 5TSTS by at least 2.3 seconds in order to improve functional strength during functional transfers. Baseline: see above Goal status: IN PROGRESS    PLAN:  PT FREQUENCY: 1-2x/week  PT DURATION: 6 weeks  PLANNED INTERVENTIONS: 97110-Therapeutic exercises, 97530- Therapeutic activity, [id number]- Neuromuscular re-education, 97535- Self Care, [id number]- Manual therapy, [phone]- Gait training, Patient/Family education, Balance training, Stair training, Joint mobilization, DME instructions, Cryotherapy, and Moist heat  PLAN FOR NEXT SESSION: Progress functional strengthening and balance. Returns to [facility] 05/24/24.  Complete progress note in 2 more visits.  12:22 PM, 05/20/24 Lorenso Romance, PTA/CLT [facility] Ph: [phone]

## 2024-05-24 ENCOUNTER — Ambulatory Visit (HOSPITAL_COMMUNITY)

## 2024-05-24 ENCOUNTER — Encounter: Payer: Self-pay | Admitting: Orthopedic Surgery

## 2024-05-24 ENCOUNTER — Ambulatory Visit (INDEPENDENT_AMBULATORY_CARE_PROVIDER_SITE_OTHER): Admitting: Orthopedic Surgery

## 2024-05-24 DIAGNOSIS — Z7409 Other reduced mobility: Secondary | ICD-10-CM

## 2024-05-24 DIAGNOSIS — M25552 Pain in left hip: Secondary | ICD-10-CM | POA: Diagnosis not present

## 2024-05-24 DIAGNOSIS — S72002A Fracture of unspecified part of neck of left femur, initial encounter for closed fracture: Secondary | ICD-10-CM

## 2024-05-24 DIAGNOSIS — R29898 Other symptoms and signs involving the musculoskeletal system: Secondary | ICD-10-CM | POA: Diagnosis not present

## 2024-05-24 NOTE — Progress Notes (Signed)
   LMP  (LMP Unknown)   There is no height or weight on file to calculate BMI.  Chief Complaint  Patient presents with   Post-op Follow-up    Hip left/ bipolar replacement after fracture improving    No diagnosis found.  DOI/DOS/ Date: 02/25/24  Improved

## 2024-05-24 NOTE — Progress Notes (Signed)
 Postop appointment  Encounter Diagnosis  Name Primary?   Closed displaced fracture of left femoral neck (HCC) 02/25/24 bipolar replacement Yes   Chief Complaint  Patient presents with   Post-op Follow-up    Hip left/ bipolar replacement after fracture improving   This is the 64-month follow-up  So far she looks good ligaments are equal she has good hip flexion active and passive no pain with range of motion  Using a walker  Recommend 71-month follow-up check left hip bipolar prosthesis

## 2024-05-24 NOTE — Therapy (Signed)
 OUTPATIENT PHYSICAL THERAPY TREATMENT   Patient Name: Tiffany Everett MRN: [id number] DOB:[birth year], 83 y.o., female Today's Date: 05/24/2024  END OF SESSION:  PT End of Session - 05/24/24 1151     Visit Number 9    Number of Visits 12    Date for PT Re-Evaluation 06/02/24    Authorization Type [employer] MEDICARE HMO/PPO    Progress Note Due on Visit 10    PT Start Time 1150    PT Stop Time 1230    PT Time Calculation (min) 40 min    Equipment Utilized During Treatment Gait belt    Activity Tolerance Patient tolerated treatment well    Behavior During Therapy WFL for tasks assessed/performed           Past Medical History:  Diagnosis Date   Colon polyps    Hypertension    Seasonal allergies    Past Surgical History:  Procedure Laterality Date   APPENDECTOMY     CATARACT EXTRACTION W/PHACO Right 09/13/2013   Procedure: CATARACT EXTRACTION PHACO AND INTRAOCULAR LENS PLACEMENT (IOC);  Surgeon: Clay Cummins, MD;  Location: [facility];  Service: Ophthalmology;  Laterality: Right;  CDE:3.25   CATARACT EXTRACTION W/PHACO Left 11/22/2013   Procedure: CATARACT EXTRACTION PHACO AND INTRAOCULAR LENS PLACEMENT (IOC);  Surgeon: Clay Cummins, MD;  Location: [facility];  Service: Ophthalmology;  Laterality: Left;  CDE: 3.95   CESAREAN SECTION     x2   COLONOSCOPY     SLF 2010: sigmoid diverticula, otherwise wnl. Repeat 5 yrs.   HIP ARTHROPLASTY Left 02/25/2024   Procedure: HEMIARTHROPLASTY (BIPOLAR) HIP;  Surgeon: Darrin Emerald, MD;  Location: [facility];  Service: Orthopedics;  Laterality: Left;   I & D EXTREMITY Left    lateral wrist   LAPAROTOMY     oopherectomy   ORIF HUMERUS FRACTURE Left    Patient Active Problem List   Diagnosis Date Noted   Neurocognitive deficits 03/02/2024   Gastroesophageal reflux disease 02/27/2024   Closed displaced fracture of left femoral neck (HCC) 02/24/2024   Chronic kidney disease (CKD) stage G3a/A1, moderately decreased glomerular  filtration rate (GFR) between 45-59 mL/min/1.73 square meter and albuminuria creatinine ratio less than 30 mg/g (HCC) 05/23/2023   Left renal atrophy 05/08/2022   Enlarged thyroid  11/07/2021   Fatigue 04/23/2021   Irregular heartbeat 04/23/2021   Mixed hyperlipidemia 04/23/2021   Vitamin D  insufficiency 04/23/2021   Borderline prolonged QT interval 02/02/2020   Polycythemia 02/01/2020   Essential hypertension    History of colonic polyps 04/24/2015    PCP: Lorre Rosin, NP   REFERRING PROVIDER: Darrin Emerald, MD  REFERRING DIAG: [phone] (ICD-10-CM) - Closed displaced fracture of left femoral neck (HCC)  THERAPY DIAG:  Pain in left hip  Weakness of left lower extremity  Impaired functional mobility, balance, gait, and endurance  Rationale for Evaluation and Treatment: Rehabilitation  ONSET DATE: Early this year, does not remember date  SUBJECTIVE:   SUBJECTIVE STATEMENT: Pain in the left hip when walking for household distances; sees Dr. Phyllis Breeze this afternoon.  60% better overall; has not been practicing walking with cane at home   Evaluation: Pt states getting up early in the morning got 3/4 to the bathroom and all of a sudden left leg collapsed and fell on left side, felt immediate pain. Pt had surgery for hip fx and then went to a rehab for 2 weeks. Pt then had a couple of weeks of home PT after that. Pt states that  she is not in pain and states the hip does not hurt much anymore just feels weak on that side. Before the fall she was not using a walker and was driving.  PERTINENT HISTORY: Hip Fx PAIN:  Are you having pain? No and Yes: NPRS scale: 3/10 Pain location: Lt hip Pain description: Sharp Aggravating factors: Positioning Relieving factors: Rearrange positioning  PRECAUTIONS: Fall  RED FLAGS: None   WEIGHT BEARING RESTRICTIONS: No  FALLS:  Has patient fallen in last 6 months? Yes. Number of falls 1  LIVING ENVIRONMENT: Lives with:  lives with their family and lives with their son Lives in: Mobile home Stairs: ramped entrance Has following equipment at home: Single point cane, Environmental consultant - 2 wheeled, shower chair, and Ramped entry  OCCUPATION: retired  PLOF: Independent  PATIENT GOALS: To get back where she was; walk without the walker, get stronger left lower extremity. Pt would like to return to yard work and driving as well.  NEXT MD VISIT: maybe next Friday   OBJECTIVE:  Note: Objective measures were completed at Evaluation unless otherwise noted.  DIAGNOSTIC FINDINGS: CLINICAL DATA:  Fall and left hip pain.   EXAM: DG HIP (WITH OR WITHOUT PELVIS) 2-3V LEFT   COMPARISON:  Pelvic radiograph dated 02/01/2020.   FINDINGS: There is a mildly displaced and impacted fracture of the left femoral neck. No dislocation. The bones are osteopenic. The soft tissues are unremarkable.   IMPRESSION: Mildly displaced and impacted fracture of the left femoral neck.  PATIENT SURVEYS:  LEFS 39/80  COGNITION: Overall cognitive status: Within functional limits for tasks assessed     SENSATION: WFL  EDEMA:  None observed, none reported    PALPATION: Pt reports no tenderness of left hip, assess OP next session  LOWER EXTREMITY ROM:  Active ROM Right eval Left eval  Hip flexion 102 100  Hip extension 5/20: 5 5/20: 0  Hip abduction    Hip adduction    Hip internal rotation    Hip external rotation    Knee flexion    Knee extension    Ankle dorsiflexion    Ankle plantarflexion    Ankle inversion    Ankle eversion     (Blank rows = not tested)  LOWER EXTREMITY MMT:  MMT Right eval Left eval Left 05/24/24  Hip flexion 4 3+ 4  Hip extension 4 3+ 2  Hip abduction 4 3+ 2  Hip adduction 4 3+   Hip internal rotation     Hip external rotation     Knee flexion     Knee extension   4  Ankle dorsiflexion (limited range bilat)   Limited range 4  Ankle plantarflexion     Ankle inversion     Ankle  eversion      (Blank rows = not tested)   FUNCTIONAL TESTS:  5 times sit to stand: 16.05 2 minute walk test: 182 feet  04/27/24:  Functional Gait Assessment Summary completed with RW 1. GAIT LEVEL SURFACE: Moderate impairment -- gait level surface (1) (1 points) 2. CHANGE IN GAIT SPEED: Moderate impairment -- change in gait speed (1) (1 points) 3. GAIT WITH HORIZONTAL HEAD TURNS: Moderate impairment -- gait with horizontal head turns (1) (1 points) 4. GAIT WITH VERTICAL HEAD TURNS: Moderate impairment -- gait with vertical head turns (1) (1 points) 5. GAIT AND PIVOT TURN: Moderate impairment -- gait and pivot turn (1) (1 points) 6. STEP OVER OBSTACLE: Moderate impairment -- step over obstacle (1) (1 points)  7. GAIT WITH NARROW BASE OF SUPPORT: Moderate impairment -- gait with narrow base of support (1) (1 points) 8. GAIT WITH EYES CLOSED: Severe impairment -- gait with eyes closed (0) (0 points) 9. AMBULATING BACKWARDS: Moderate impairment -- ambulating backwards (1) (1 points) 10. STEPS: Mild impairment -- up and down steps (2) (2 points) Functional Gait Assessment: 10/30=33.3 percent. (Normal for age >20/30)   GAIT: Distance walked: 182 feet Assistive device utilized: Walker - 2 wheeled Level of assistance: Modified independence Comments: Pt demonstrates decreased stride length and stance time on left side.                                                                                                                                TREATMENT DATE:  05/24/24 Ambulation with SPC and gait belt x 1 lap PT gym ~224 ft Heel raises 2 x 10 2 MWT 235 ft with RW MMT's see above Right sidelying clam 2 x 10 Supine: hip abduction red theraband 2 x 10 Bridge 3 x 10 with red theraband around thighs Sit to stand from mat table no UE assist 2 x 10; cues for controlled descent   05/20/24 Standing in // bars, bil UE assist Hip flexion 2x 10 each with GTB around ankles Hip extension 2x 10  each with GTB around ankles Hip abduction 2x 10 each with GTB around ankles Side stepping in // bars 1 UE assist GTB around ankles 2RT 6 box step ups 2 x 10 bilaterally with bil UE assist 6 box lateral step ups left only 2 x 10 bil UE assist, Modified Tandem stance 3X30 Vectors bilateral with 1 UE assist 5X5 each Gait training in PT gym x 1 lap with SPC and CGA  05/18/24 Sit to stand no UE assist x 10; tends to lean posteriorly; needs min A 2 times to recover safely Gait training in PT gym x 1 lap with SPC and CGA Green theraband around knees: sidestepping x 10 each leg Hip flexion x 10 each Hip extension x 10 each Hip abduction x 10 each Standing: 4 box step ups 2 x 10 left leading 4 box lateral step ups left 2 x 10 Sit to stand holding red med ball Tandem stance 2 x 20 each 4 box toe taps; unable to tap right toe without UE support x 10 each Updated HEP     05/13/24: Gait training with SPC 263ft; able to complete 2 point sequence with 252ft STS 10x with foam STS standard height increased difficulty Heel raises on incline 2 x 10 Toe raises on decline 2 x 10 Toe tapping 6in 20x with 1 HHA 6in hurdles step over then back same leg 10x with 1 UE A Side step GTB around thigh 3RT inside //bars Vector stance 3x 5 with HHA Squat front of chair 10x    PATIENT EDUCATION:  Education details: Pt was educated on findings of PT evaluation, prognosis, frequency of  therapy visits and rationale, attendance policy, and HEP if given.   Person educated: Patient Education method: Explanation, Verbal cues, and Handouts Education comprehension: verbalized understanding, verbal cues required, tactile cues required, and needs further education  HOME EXERCISE PROGRAM: Access Code: [email or web address] URL: [email or web address] Date: 05/18/2024 Prepared by: [facility]  Exercises - Standing Hip Abduction with Resistance at Ankles and Counter Support  - 1 x daily - 7 x  weekly - 2 sets - 10 reps - Standing Hip Extension with Resistance at Ankles and Counter Support  - 1 x daily - 7 x weekly - 2 sets - 10 reps - Standing March with Unilateral Counter Support  - 1 x daily - 7 x weekly - 2 sets - 10 reps - tandem stance balance; try not to hold on  - 1 x daily - 7 x weekly - 1 sets - 5 reps - 15 sec hold Access Code: [email or web address] URL: [email or web address] Date: 05/18/2024 Prepared by: [facility]  Exercises - Supine Bridge  - 1 x daily - 7 x weekly - 3 sets - 10 reps - Sit to Stand Without Arm Support  - 1 x daily - 7 x weekly - 3 sets - 10 reps - Clamshell  - 1 x daily - 7 x weekly - 3 sets - 10 reps - Standing Hip Abduction with Resistance at Ankles and Counter Support  - 1 x daily - 7 x weekly - 2 sets - 10 reps - Standing Hip Extension with Resistance at Ankles and Counter Support  - 1 x daily - 7 x weekly - 2 sets - 10 reps - Standing March with Unilateral Counter Support  - 1 x daily - 7 x weekly - 2 sets - 10 reps - tandem stance balance; try not to hold on  - 1 x daily - 7 x weekly - 1 sets - 5 reps - 15 sec hold Access Code: [email or web address] URL: [email or web address] Date: 04/21/2024 Prepared by: Armond Bertin  Exercises - Supine Bridge  - 1 x daily - 7 x weekly - 3 sets - 10 reps - Sit to Stand Without Arm Support  - 1 x daily - 7 x weekly - 3 sets - 10 reps - Clamshell  - 1 x daily - 7 x weekly - 3 sets - 10 reps  ASSESSMENT:  CLINICAL IMPRESSION: Needed one time reminder to push up from chair with sit to stand on patient arrival but afterwards demonstrates compliance. Good improvement with 2 MWT.  Patient able to walk with [facility] with CGA but needs cues to look ahead and pick up her feet especially as she fatigues.   Patient still very weak left hip abductors and extensors.  Focus today on left hip abduction and extension strength. Encouraged patient compliance with clam and bridge at home with HEP and she verbalizes understanding.  Needs cues to control descent with return to sitting.    Patient will benefit from continued skilled therapy services to address deficits and promote return to optimal function.      Evaluation: Patient is a 83 y.o. female who was seen today for physical therapy evaluation and treatment for S72.002A (ICD-10-CM) - Closed displaced fracture of left femoral neck (HCC).  Patient demonstrates decreased LLE strength, abnormal pain in left hip with prolonged standing and walking, and impaired balance. Patient also demonstrates difficulty with ambulation during today's session with decreased left stride length, stance time, and velocity noted. Patient also demonstrates decreased quality of movement with  bed mobility as it is painful rolling onto left side. Patient requires verbal and tactile cues for HEP prescription and proper form. Patient would benefit from skilled physical therapy for decreased pain and increased endurance with ambulation, increased LE strength, and balance for improved gait quality, return to higher level of function with ADLs, and progress towards therapy goals.   OBJECTIVE IMPAIRMENTS: Abnormal gait, decreased activity tolerance, decreased balance, decreased endurance, decreased mobility, difficulty walking, decreased strength, and pain.   ACTIVITY LIMITATIONS: carrying, lifting, bending, standing, squatting, stairs, transfers, bed mobility, and locomotion level  PARTICIPATION LIMITATIONS: meal prep, cleaning, laundry, driving, shopping, community activity, and yard work  PERSONAL FACTORS: Age, Fitness, Past/current experiences, and 1 comorbidity: recent fall and hip fx are also affecting patient's functional outcome.   REHAB POTENTIAL: Good  CLINICAL DECISION MAKING: Stable/uncomplicated  EVALUATION COMPLEXITY: Low   GOALS: Goals reviewed with patient? Yes  SHORT TERM GOALS: Target date: 05/12/24  Patient will demonstrate evidence of independence with individualized HEP  and will report compliance for at least 3 days per week for optimized progression towards remaining therapy goals. Baseline:  Goal status: IN PROGRESS  2.  Patient will report a decrease in pain level during community ambulation by at least 2 points for improved quality of life. Baseline: 2/10 Goal status: IN PROGRESS     LONG TERM GOALS: Target date: 06/02/24  Pt will demonstrate a an increase of at least 9 points on the LEFS for improved performance of community ambulation and ADL. Baseline: see above Goal status: IN PROGRESS  2.  Pt will improve 2 MWT by 140  in order to demonstrate improved functional ambulatory capacity in community setting.  Baseline: see above Goal status: IN PROGRESS  3.  Pt will demonstrate WFL pain free ROM in left hip, for increased mobility and maximal efficiency of gait cycle during ambulation. Baseline: see objective Goal status: IN PROGRESS  4.  Pt will demonstrate at least 4/5 MMT for left lower extremity for increased strength during ADL and community ambulation. Baseline: see objective Goal status: IN PROGRESS  5.  Pt will improve 5TSTS by at least 2.3 seconds in order to improve functional strength during functional transfers. Baseline: see above Goal status: IN PROGRESS    PLAN:  PT FREQUENCY: 1-2x/week  PT DURATION: 6 weeks  PLANNED INTERVENTIONS: 97110-Therapeutic exercises, 97530- Therapeutic activity, [id number]- Neuromuscular re-education, 97535- Self Care, [id number]- Manual therapy, [phone]- Gait training, Patient/Family education, Balance training, Stair training, Joint mobilization, DME instructions, Cryotherapy, and Moist heat  PLAN FOR NEXT SESSION: Progress functional strengthening and balance. Returns to [facility] 05/24/24.  Complete progress note in 1 more visit.  12:44 PM, 05/24/24 Emyah Roznowski Small Eilee Schader MPT [facility] [place] [phone]

## 2024-05-27 ENCOUNTER — Ambulatory Visit (HOSPITAL_COMMUNITY)

## 2024-05-27 DIAGNOSIS — M25552 Pain in left hip: Secondary | ICD-10-CM | POA: Diagnosis not present

## 2024-05-27 DIAGNOSIS — Z7409 Other reduced mobility: Secondary | ICD-10-CM | POA: Diagnosis not present

## 2024-05-27 DIAGNOSIS — R29898 Other symptoms and signs involving the musculoskeletal system: Secondary | ICD-10-CM

## 2024-05-27 NOTE — Therapy (Signed)
 OUTPATIENT PHYSICAL THERAPY TREATMENT/Progress note Progress Note Reporting Period 02/26/24 to 05/27/24  See note below for Objective Data and Assessment of Progress/Goals.       Patient Name: Tiffany Everett MRN: [id number] DOB:04-28-1941, 83 y.o., female Today's Date: 05/27/2024  END OF SESSION:  PT End of Session - 05/27/24 1148     Visit Number 10    Number of Visits 20    Date for PT Re-Evaluation 06/25/24    Authorization Type [employer] MEDICARE HMO/PPO    Progress Note Due on Visit 10    PT Start Time 1148    PT Stop Time 1228    PT Time Calculation (min) 40 min    Equipment Utilized During Treatment Gait belt    Activity Tolerance Patient tolerated treatment well    Behavior During Therapy WFL for tasks assessed/performed           Past Medical History:  Diagnosis Date   Colon polyps    Hypertension    Seasonal allergies    Past Surgical History:  Procedure Laterality Date   APPENDECTOMY     CATARACT EXTRACTION W/PHACO Right 09/13/2013   Procedure: CATARACT EXTRACTION PHACO AND INTRAOCULAR LENS PLACEMENT (IOC);  Surgeon: Clay Cummins, MD;  Location: [facility];  Service: Ophthalmology;  Laterality: Right;  CDE:3.25   CATARACT EXTRACTION W/PHACO Left 11/22/2013   Procedure: CATARACT EXTRACTION PHACO AND INTRAOCULAR LENS PLACEMENT (IOC);  Surgeon: Clay Cummins, MD;  Location: [facility];  Service: Ophthalmology;  Laterality: Left;  CDE: 3.95   CESAREAN SECTION     x2   COLONOSCOPY     SLF 2010: sigmoid diverticula, otherwise wnl. Repeat 5 yrs.   HIP ARTHROPLASTY Left 02/25/2024   Procedure: HEMIARTHROPLASTY (BIPOLAR) HIP;  Surgeon: Darrin Emerald, MD;  Location: [facility];  Service: Orthopedics;  Laterality: Left;   I & D EXTREMITY Left    lateral wrist   LAPAROTOMY     oopherectomy   ORIF HUMERUS FRACTURE Left    Patient Active Problem List   Diagnosis Date Noted   Neurocognitive deficits 03/02/2024   Gastroesophageal reflux disease 02/27/2024    Closed displaced fracture of left femoral neck (HCC) 02/24/2024   Chronic kidney disease (CKD) stage G3a/A1, moderately decreased glomerular filtration rate (GFR) between 45-59 mL/min/1.73 square meter and albuminuria creatinine ratio less than 30 mg/g (HCC) 05/23/2023   Left renal atrophy 05/08/2022   Enlarged thyroid  11/07/2021   Fatigue 04/23/2021   Irregular heartbeat 04/23/2021   Mixed hyperlipidemia 04/23/2021   Vitamin D  insufficiency 04/23/2021   Borderline prolonged QT interval 02/02/2020   Polycythemia 02/01/2020   Essential hypertension    History of colonic polyps 04/24/2015    PCP: Lorre Rosin, NP   REFERRING PROVIDER: Darrin Emerald, MD  REFERRING DIAG: [phone] (ICD-10-CM) - Closed displaced fracture of left femoral neck (HCC)  THERAPY DIAG:  Pain in left hip - Plan: PT plan of care cert/re-cert  Weakness of left lower extremity - Plan: PT plan of care cert/re-cert  Impaired functional mobility, balance, gait, and endurance - Plan: PT plan of care cert/re-cert  Rationale for Evaluation and Treatment: Rehabilitation  ONSET DATE: Early this year, does not remember date  SUBJECTIVE:   SUBJECTIVE STATEMENT: Saw Dr. Phyllis Breeze; no changes to treatment plan per patient and son; no pain in hip today.  Can't remember last time she had pain. When asked if she has been doing her HEP she states not really.    Evaluation: Pt states getting up  early in the morning got 3/4 to the bathroom and all of a sudden left leg collapsed and fell on left side, felt immediate pain. Pt had surgery for hip fx and then went to a rehab for 2 weeks. Pt then had a couple of weeks of home PT after that. Pt states that she is not in pain and states the hip does not hurt much anymore just feels weak on that side. Before the fall she was not using a walker and was driving.  PERTINENT HISTORY: Hip Fx PAIN:  Are you having pain? No and Yes: NPRS scale: 3/10 Pain location: Lt hip Pain  description: Sharp Aggravating factors: Positioning Relieving factors: Rearrange positioning  PRECAUTIONS: Fall  RED FLAGS: None   WEIGHT BEARING RESTRICTIONS: No  FALLS:  Has patient fallen in last 6 months? Yes. Number of falls 1  LIVING ENVIRONMENT: Lives with: lives with their family and lives with their son Lives in: Mobile home Stairs: ramped entrance Has following equipment at home: Single point cane, Environmental consultant - 2 wheeled, shower chair, and Ramped entry  OCCUPATION: retired  PLOF: Independent  PATIENT GOALS: To get back where she was; walk without the walker, get stronger left lower extremity. Pt would like to return to yard work and driving as well.  NEXT MD VISIT: maybe next Friday   OBJECTIVE:  Note: Objective measures were completed at Evaluation unless otherwise noted.  DIAGNOSTIC FINDINGS: CLINICAL DATA:  Fall and left hip pain.   EXAM: DG HIP (WITH OR WITHOUT PELVIS) 2-3V LEFT   COMPARISON:  Pelvic radiograph dated 02/01/2020.   FINDINGS: There is a mildly displaced and impacted fracture of the left femoral neck. No dislocation. The bones are osteopenic. The soft tissues are unremarkable.   IMPRESSION: Mildly displaced and impacted fracture of the left femoral neck.  PATIENT SURVEYS:  LEFS 39/80  COGNITION: Overall cognitive status: Within functional limits for tasks assessed     SENSATION: WFL  EDEMA:  None observed, none reported    PALPATION: Pt reports no tenderness of left hip, assess OP next session  LOWER EXTREMITY ROM:  Active ROM Right eval Left eval  Hip flexion 102 100  Hip extension 5/20: 5 5/20: 0  Hip abduction    Hip adduction    Hip internal rotation    Hip external rotation    Knee flexion    Knee extension    Ankle dorsiflexion    Ankle plantarflexion    Ankle inversion    Ankle eversion     (Blank rows = not tested)  LOWER EXTREMITY MMT:  MMT Right eval Left eval Left 05/24/24  Hip flexion 4 3+ 4   Hip extension 4 3+ 2  Hip abduction 4 3+ 2  Hip adduction 4 3+   Hip internal rotation     Hip external rotation     Knee flexion     Knee extension   4  Ankle dorsiflexion (limited range bilat)   Limited range 4  Ankle plantarflexion     Ankle inversion     Ankle eversion      (Blank rows = not tested)   FUNCTIONAL TESTS:  5 times sit to stand: 16.05 2 minute walk test: 182 feet  04/27/24:  Functional Gait Assessment Summary completed with RW 1. GAIT LEVEL SURFACE: Moderate impairment -- gait level surface (1) (1 points) 2. CHANGE IN GAIT SPEED: Moderate impairment -- change in gait speed (1) (1 points) 3. GAIT WITH HORIZONTAL HEAD TURNS: Moderate  impairment -- gait with horizontal head turns (1) (1 points) 4. GAIT WITH VERTICAL HEAD TURNS: Moderate impairment -- gait with vertical head turns (1) (1 points) 5. GAIT AND PIVOT TURN: Moderate impairment -- gait and pivot turn (1) (1 points) 6. STEP OVER OBSTACLE: Moderate impairment -- step over obstacle (1) (1 points) 7. GAIT WITH NARROW BASE OF SUPPORT: Moderate impairment -- gait with narrow base of support (1) (1 points) 8. GAIT WITH EYES CLOSED: Severe impairment -- gait with eyes closed (0) (0 points) 9. AMBULATING BACKWARDS: Moderate impairment -- ambulating backwards (1) (1 points) 10. STEPS: Mild impairment -- up and down steps (2) (2 points) Functional Gait Assessment: 10/30=33.3 percent. (Normal for age >20/30)   GAIT: Distance walked: 182 feet Assistive device utilized: Walker - 2 wheeled Level of assistance: Modified independence Comments: Pt demonstrates decreased stride length and stance time on left side.                                                                                                                                TREATMENT DATE:  05/27/24 Progress note LEFS 35/80 43.8% 5 times sit to stand 23.46 sec needing multiple attempts on initial stand MMT's and 2 MWT done last session Goal  review Standing heel raises on incline 2 x 10 Toe raises 2 x 10 Hip abduction 2 x 10 each with 1 UE assist Hip extension with 1 UE assist 2 x 10 Tandem stance 30 x 3 each Squat to chair for target 2 x 10   05/24/24 Ambulation with SPC and gait belt x 1 lap PT gym ~224 ft Heel raises 2 x 10 2 MWT 235 ft with RW MMT's see above Right sidelying clam 2 x 10 Supine: hip abduction red theraband 2 x 10 Bridge 3 x 10 with red theraband around thighs Sit to stand from mat table no UE assist 2 x 10; cues for controlled descent   05/20/24 Standing in // bars, bil UE assist Hip flexion 2x 10 each with GTB around ankles Hip extension 2x 10 each with GTB around ankles Hip abduction 2x 10 each with GTB around ankles Side stepping in // bars 1 UE assist GTB around ankles 2RT 6 box step ups 2 x 10 bilaterally with bil UE assist 6 box lateral step ups left only 2 x 10 bil UE assist, Modified Tandem stance 3X30 Vectors bilateral with 1 UE assist 5X5 each Gait training in PT gym x 1 lap with SPC and CGA  05/18/24 Sit to stand no UE assist x 10; tends to lean posteriorly; needs min A 2 times to recover safely Gait training in PT gym x 1 lap with SPC and CGA Green theraband around knees: sidestepping x 10 each leg Hip flexion x 10 each Hip extension x 10 each Hip abduction x 10 each Standing: 4 box step ups 2 x 10 left leading 4 box lateral step ups left 2 x  10 Sit to stand holding red med ball Tandem stance 2 x 20 each 4 box toe taps; unable to tap right toe without UE support x 10 each Updated HEP     05/13/24: Gait training with SPC 239ft; able to complete 2 point sequence with 261ft STS 10x with foam STS standard height increased difficulty Heel raises on incline 2 x 10 Toe raises on decline 2 x 10 Toe tapping 6in 20x with 1 HHA 6in hurdles step over then back same leg 10x with 1 UE A Side step GTB around thigh 3RT inside //bars Vector stance 3x 5 with HHA Squat  front of chair 10x    PATIENT EDUCATION:  Education details: Pt was educated on findings of PT evaluation, prognosis, frequency of therapy visits and rationale, attendance policy, and HEP if given.   Person educated: Patient Education method: Explanation, Verbal cues, and Handouts Education comprehension: verbalized understanding, verbal cues required, tactile cues required, and needs further education  HOME EXERCISE PROGRAM: Access Code: [email or web address] URL: [email or web address] Date: 05/18/2024 Prepared by: [facility]  Exercises - Standing Hip Abduction with Resistance at Ankles and Counter Support  - 1 x daily - 7 x weekly - 2 sets - 10 reps - Standing Hip Extension with Resistance at Ankles and Counter Support  - 1 x daily - 7 x weekly - 2 sets - 10 reps - Standing March with Unilateral Counter Support  - 1 x daily - 7 x weekly - 2 sets - 10 reps - tandem stance balance; try not to hold on  - 1 x daily - 7 x weekly - 1 sets - 5 reps - 15 sec hold Access Code: [email or web address] URL: [email or web address] Date: 05/18/2024 Prepared by: [facility]  Exercises - Supine Bridge  - 1 x daily - 7 x weekly - 3 sets - 10 reps - Sit to Stand Without Arm Support  - 1 x daily - 7 x weekly - 3 sets - 10 reps - Clamshell  - 1 x daily - 7 x weekly - 3 sets - 10 reps - Standing Hip Abduction with Resistance at Ankles and Counter Support  - 1 x daily - 7 x weekly - 2 sets - 10 reps - Standing Hip Extension with Resistance at Ankles and Counter Support  - 1 x daily - 7 x weekly - 2 sets - 10 reps - Standing March with Unilateral Counter Support  - 1 x daily - 7 x weekly - 2 sets - 10 reps - tandem stance balance; try not to hold on  - 1 x daily - 7 x weekly - 1 sets - 5 reps - 15 sec hold Access Code: [email or web address] URL: [email or web address] Date: 04/21/2024 Prepared by: Armond Bertin  Exercises - Supine Bridge  - 1 x daily - 7 x weekly - 3 sets - 10 reps - Sit to Stand  Without Arm Support  - 1 x daily - 7 x weekly - 3 sets - 10 reps - Clamshell  - 1 x daily - 7 x weekly - 3 sets - 10 reps  ASSESSMENT:  CLINICAL IMPRESSION: Progress note today for 10th visit.  Decline in LEFS score and slight decline in sit to stand score. Questioned patient about possible UTI but she denies any symptoms. Patient generally with flat effect.  Explained to patient the importance of compliance with HEP for good outcomes.   Overall demonstrates improvement with 2 MWT and strength; will benefit from continued skilled therapy  services to address remaining unmet and partially met goals.   Patient will benefit from continued skilled therapy services to address deficits and promote return to optimal function.      Evaluation: Patient is a 83 y.o. female who was seen today for physical therapy evaluation and treatment for S72.002A (ICD-10-CM) - Closed displaced fracture of left femoral neck (HCC).  Patient demonstrates decreased LLE strength, abnormal pain in left hip with prolonged standing and walking, and impaired balance. Patient also demonstrates difficulty with ambulation during today's session with decreased left stride length, stance time, and velocity noted. Patient also demonstrates decreased quality of movement with bed mobility as it is painful rolling onto left side. Patient requires verbal and tactile cues for HEP prescription and proper form. Patient would benefit from skilled physical therapy for decreased pain and increased endurance with ambulation, increased LE strength, and balance for improved gait quality, return to higher level of function with ADLs, and progress towards therapy goals.   OBJECTIVE IMPAIRMENTS: Abnormal gait, decreased activity tolerance, decreased balance, decreased endurance, decreased mobility, difficulty walking, decreased strength, and pain.   ACTIVITY LIMITATIONS: carrying, lifting, bending, standing, squatting, stairs, transfers, bed mobility, and  locomotion level  PARTICIPATION LIMITATIONS: meal prep, cleaning, laundry, driving, shopping, community activity, and yard work  PERSONAL FACTORS: Age, Fitness, Past/current experiences, and 1 comorbidity: recent fall and hip fx are also affecting patient's functional outcome.   REHAB POTENTIAL: Good  CLINICAL DECISION MAKING: Stable/uncomplicated  EVALUATION COMPLEXITY: Low   GOALS: Goals reviewed with patient? Yes  SHORT TERM GOALS: Target date: 05/12/24  Patient will demonstrate evidence of independence with individualized HEP and will report compliance for at least 3 days per week for optimized progression towards remaining therapy goals. Baseline:  Goal status: IN PROGRESS  2.  Patient will report a decrease in pain level during community ambulation by at least 2 points for improved quality of life. Baseline: 2/10 Goal status: MET     LONG TERM GOALS: Target date: 06/02/24  Pt will demonstrate a an increase of at least 9 points on the LEFS for improved performance of community ambulation and ADL. Baseline: see above Goal status: IN PROGRESS  2.  Pt will improve 2 MWT by 140  in order to demonstrate improved functional ambulatory capacity in community setting.  Baseline: see above; improved by 53 ft Goal status: IN PROGRESS  3.  Pt will demonstrate WFL pain free ROM in left hip, for increased mobility and maximal efficiency of gait cycle during ambulation. Baseline: see objective Goal status: IN PROGRESS  4.  Pt will demonstrate at least 4/5 MMT for left lower extremity for increased strength during ADL and community ambulation. Baseline: see objective Goal status: IN PROGRESS  5.  Pt will improve 5TSTS by at least 2.3 seconds in order to improve functional strength during functional transfers. Baseline: see above Goal status: IN PROGRESS    PLAN:  PT FREQUENCY: 1-2x/week  PT DURATION: 6 weeks  PLANNED INTERVENTIONS: 97110-Therapeutic exercises, 97530-  Therapeutic activity, [id number]- Neuromuscular re-education, 97535- Self Care, [id number]- Manual therapy, [id number]- Gait training, Patient/Family education, Balance training, Stair training, Joint mobilization, DME instructions, Cryotherapy, and Moist heat  PLAN FOR NEXT SESSION: Progress functional strengthening and balance. Continue 2 x a week for 4 more weeks to address remaining unmet and partially met goals 12:23 PM, 05/27/24 Sabree Nuon Small Nathanel Tallman MPT [facility] [place] [phone]

## 2024-06-01 ENCOUNTER — Encounter (HOSPITAL_COMMUNITY): Payer: Self-pay

## 2024-06-01 ENCOUNTER — Ambulatory Visit (HOSPITAL_COMMUNITY)

## 2024-06-01 DIAGNOSIS — Z7409 Other reduced mobility: Secondary | ICD-10-CM

## 2024-06-01 DIAGNOSIS — R29898 Other symptoms and signs involving the musculoskeletal system: Secondary | ICD-10-CM | POA: Diagnosis not present

## 2024-06-01 DIAGNOSIS — M25552 Pain in left hip: Secondary | ICD-10-CM

## 2024-06-01 NOTE — Therapy (Signed)
 OUTPATIENT PHYSICAL THERAPY TREATMENT/Progress note Progress Note Reporting Period 02/26/24 to 05/27/24  See note below for Objective Data and Assessment of Progress/Goals.       Patient Name: Tiffany Everett MRN: [id number] DOB:08/14/1941, 83 y.o., female Today's Date: 06/01/2024  END OF SESSION:     Past Medical History:  Diagnosis Date   Colon polyps    Hypertension    Seasonal allergies    Past Surgical History:  Procedure Laterality Date   APPENDECTOMY     CATARACT EXTRACTION W/PHACO Right 09/13/2013   Procedure: CATARACT EXTRACTION PHACO AND INTRAOCULAR LENS PLACEMENT (IOC);  Surgeon: Dow JULIANNA Burke, MD;  Location: [facility];  Service: Ophthalmology;  Laterality: Right;  CDE:3.25   CATARACT EXTRACTION W/PHACO Left 11/22/2013   Procedure: CATARACT EXTRACTION PHACO AND INTRAOCULAR LENS PLACEMENT (IOC);  Surgeon: Dow JULIANNA Burke, MD;  Location: [facility];  Service: Ophthalmology;  Laterality: Left;  CDE: 3.95   CESAREAN SECTION     x2   COLONOSCOPY     SLF 2010: sigmoid diverticula, otherwise wnl. Repeat 5 yrs.   HIP ARTHROPLASTY Left 02/25/2024   Procedure: HEMIARTHROPLASTY (BIPOLAR) HIP;  Surgeon: Margrette Taft BRAVO, MD;  Location: [facility];  Service: Orthopedics;  Laterality: Left;   I & D EXTREMITY Left    lateral wrist   LAPAROTOMY     oopherectomy   ORIF HUMERUS FRACTURE Left    Patient Active Problem List   Diagnosis Date Noted   Neurocognitive deficits 03/02/2024   Gastroesophageal reflux disease 02/27/2024   Closed displaced fracture of left femoral neck (HCC) 02/24/2024   Chronic kidney disease (CKD) stage G3a/A1, moderately decreased glomerular filtration rate (GFR) between 45-59 mL/min/1.73 square meter and albuminuria creatinine ratio less than 30 mg/g (HCC) 05/23/2023   Left renal atrophy 05/08/2022   Enlarged thyroid  11/07/2021   Fatigue 04/23/2021   Irregular heartbeat 04/23/2021   Mixed hyperlipidemia 04/23/2021   Vitamin D  insufficiency 04/23/2021    Borderline prolonged QT interval 02/02/2020   Polycythemia 02/01/2020   Essential hypertension    History of colonic polyps 04/24/2015    PCP: Suanne Pfeiffer, NP   REFERRING PROVIDER: Margrette Taft BRAVO, MD  REFERRING DIAG: S72.002A (ICD-10-CM) - Closed displaced fracture of left femoral neck (HCC)  THERAPY DIAG:  No diagnosis found.  Rationale for Evaluation and Treatment: Rehabilitation  ONSET DATE: Early this year, does not remember date  SUBJECTIVE:   SUBJECTIVE STATEMENT: Pt states she is not in much pain at all. Pt states she would like to get back to driving eventually.  Evaluation: Pt states getting up early in the morning got 3/4 to the bathroom and all of a sudden left leg collapsed and fell on left side, felt immediate pain. Pt had surgery for hip fx and then went to a rehab for 2 weeks. Pt then had a couple of weeks of home PT after that. Pt states that she is not in pain and states the hip does not hurt much anymore just feels weak on that side. Before the fall she was not using a walker and was driving.  PERTINENT HISTORY: Hip Fx PAIN:  Are you having pain? No and Yes: NPRS scale: 3/10 Pain location: Lt hip Pain description: Sharp Aggravating factors: Positioning Relieving factors: Rearrange positioning  PRECAUTIONS: Fall  RED FLAGS: None   WEIGHT BEARING RESTRICTIONS: No  FALLS:  Has patient fallen in last 6 months? Yes. Number of falls 1  LIVING ENVIRONMENT: Lives with: lives with their family and lives with their  son Lives in: Mobile home Stairs: ramped entrance Has following equipment at home: Single point cane, Walker - 2 wheeled, shower chair, and Ramped entry  OCCUPATION: retired  PLOF: Independent  PATIENT GOALS: To get back where she was; walk without the walker, get stronger left lower extremity. Pt would like to return to yard work and driving as well.  NEXT MD VISIT: maybe next Friday   OBJECTIVE:  Note: Objective measures  were completed at Evaluation unless otherwise noted.  DIAGNOSTIC FINDINGS: CLINICAL DATA:  Fall and left hip pain.   EXAM: DG HIP (WITH OR WITHOUT PELVIS) 2-3V LEFT   COMPARISON:  Pelvic radiograph dated 02/01/2020.   FINDINGS: There is a mildly displaced and impacted fracture of the left femoral neck. No dislocation. The bones are osteopenic. The soft tissues are unremarkable.   IMPRESSION: Mildly displaced and impacted fracture of the left femoral neck.  PATIENT SURVEYS:  LEFS 39/80  COGNITION: Overall cognitive status: Within functional limits for tasks assessed     SENSATION: WFL  EDEMA:  None observed, none reported    PALPATION: Pt reports no tenderness of left hip, assess OP next session  LOWER EXTREMITY ROM:  Active ROM Right eval Left eval  Hip flexion 102 100  Hip extension 5/20: 5 5/20: 0  Hip abduction    Hip adduction    Hip internal rotation    Hip external rotation    Knee flexion    Knee extension    Ankle dorsiflexion    Ankle plantarflexion    Ankle inversion    Ankle eversion     (Blank rows = not tested)  LOWER EXTREMITY MMT:  MMT Right eval Left eval Left 05/24/24  Hip flexion 4 3+ 4  Hip extension 4 3+ 2  Hip abduction 4 3+ 2  Hip adduction 4 3+   Hip internal rotation     Hip external rotation     Knee flexion     Knee extension   4  Ankle dorsiflexion (limited range bilat)   Limited range 4  Ankle plantarflexion     Ankle inversion     Ankle eversion      (Blank rows = not tested)   FUNCTIONAL TESTS:  5 times sit to stand: 16.05 2 minute walk test: 182 feet  04/27/24:  Functional Gait Assessment Summary completed with RW 1. GAIT LEVEL SURFACE: Moderate impairment -- gait level surface (1) (1 points) 2. CHANGE IN GAIT SPEED: Moderate impairment -- change in gait speed (1) (1 points) 3. GAIT WITH HORIZONTAL HEAD TURNS: Moderate impairment -- gait with horizontal head turns (1) (1 points) 4. GAIT WITH VERTICAL HEAD  TURNS: Moderate impairment -- gait with vertical head turns (1) (1 points) 5. GAIT AND PIVOT TURN: Moderate impairment -- gait and pivot turn (1) (1 points) 6. STEP OVER OBSTACLE: Moderate impairment -- step over obstacle (1) (1 points) 7. GAIT WITH NARROW BASE OF SUPPORT: Moderate impairment -- gait with narrow base of support (1) (1 points) 8. GAIT WITH EYES CLOSED: Severe impairment -- gait with eyes closed (0) (0 points) 9. AMBULATING BACKWARDS: Moderate impairment -- ambulating backwards (1) (1 points) 10. STEPS: Mild impairment -- up and down steps (2) (2 points) Functional Gait Assessment: 10/30=33.3 percent. (Normal for age >20/30)   GAIT: Distance walked: 182 feet Assistive device utilized: Walker - 2 wheeled Level of assistance: Modified independence Comments: Pt demonstrates decreased stride length and stance time on left side.  TREATMENT DATE:  06/01/2024  Therapeutic Exercise: -Supine bridges 1 sets of 10 reps, 3 second holds, symptomatic, pt cued for max hip extension -Walking Marches/butt kicks with 3 lb cuffs, 2 laps of each variation, 20 foot laps, SPC and CGA for saftey -Lateral stepping 2 laps 20 feet per lap, with 3 lb cuff around ankles, pt cued for upright posture -Ambulation with SPC and gait belt x 1 lap PT gym ~220 ft   Therapeutic Activity: -Sit to stands, 2 sets of 6 reps, with red trampoline toss, 3 tosses each rep -Step up and overs, 2 sets of 8 reps bilaterally in parallel bars, 6 inch step -Lateral step up and overs, 2 sets of 7 reps bilaterally, in parallel bars, BUE support required, 6 inch step   05/27/24 Progress note LEFS 35/80 43.8% 5 times sit to stand 23.46 sec needing multiple attempts on initial stand MMT's and 2 MWT done last session Goal review Standing heel raises on incline 2 x 10 Toe raises 2 x 10 Hip  abduction 2 x 10 each with 1 UE assist Hip extension with 1 UE assist 2 x 10 Tandem stance 30 x 3 each Squat to chair for target 2 x 10   05/24/24 Ambulation with SPC and gait belt x 1 lap PT gym ~224 ft Heel raises 2 x 10 2 MWT 235 ft with RW MMT's see above Right sidelying clam 2 x 10 Supine: hip abduction red theraband 2 x 10 Bridge 3 x 10 with red theraband around thighs Sit to stand from mat table no UE assist 2 x 10; cues for controlled descent   05/20/24 Standing in // bars, bil UE assist Hip flexion 2x 10 each with GTB around ankles Hip extension 2x 10 each with GTB around ankles Hip abduction 2x 10 each with GTB around ankles Side stepping in // bars 1 UE assist GTB around ankles 2RT 6 box step ups 2 x 10 bilaterally with bil UE assist 6 box lateral step ups left only 2 x 10 bil UE assist, Modified Tandem stance 3X30 Vectors bilateral with 1 UE assist 5X5 each Gait training in PT gym x 1 lap with SPC and CGA  05/18/24 Sit to stand no UE assist x 10; tends to lean posteriorly; needs min A 2 times to recover safely Gait training in PT gym x 1 lap with SPC and CGA Green theraband around knees: sidestepping x 10 each leg Hip flexion x 10 each Hip extension x 10 each Hip abduction x 10 each Standing: 4 box step ups 2 x 10 left leading 4 box lateral step ups left 2 x 10 Sit to stand holding red med ball Tandem stance 2 x 20 each 4 box toe taps; unable to tap right toe without UE support x 10 each Updated HEP     05/13/24: Gait training with SPC 229ft; able to complete 2 point sequence with 251ft STS 10x with foam STS standard height increased difficulty Heel raises on incline 2 x 10 Toe raises on decline 2 x 10 Toe tapping 6in 20x with 1 HHA 6in hurdles step over then back same leg 10x with 1 UE A Side step GTB around thigh 3RT inside //bars Vector stance 3x 5 with HHA Squat front of chair 10x    PATIENT EDUCATION:  Education details: Pt  was educated on findings of PT evaluation, prognosis, frequency of therapy visits and rationale, attendance policy, and HEP if given.   Person educated: Patient Education  method: Explanation, Verbal cues, and Handouts Education comprehension: verbalized understanding, verbal cues required, tactile cues required, and needs further education  HOME EXERCISE PROGRAM: Access Code: [email or web address] URL: [email or web address] Date: 05/18/2024 Prepared by: [facility]  Exercises - Standing Hip Abduction with Resistance at Ankles and Counter Support  - 1 x daily - 7 x weekly - 2 sets - 10 reps - Standing Hip Extension with Resistance at Ankles and Counter Support  - 1 x daily - 7 x weekly - 2 sets - 10 reps - Standing March with Unilateral Counter Support  - 1 x daily - 7 x weekly - 2 sets - 10 reps - tandem stance balance; try not to hold on  - 1 x daily - 7 x weekly - 1 sets - 5 reps - 15 sec hold Access Code: [email or web address] URL: [email or web address] Date: 05/18/2024 Prepared by: [facility]  Exercises - Supine Bridge  - 1 x daily - 7 x weekly - 3 sets - 10 reps - Sit to Stand Without Arm Support  - 1 x daily - 7 x weekly - 3 sets - 10 reps - Clamshell  - 1 x daily - 7 x weekly - 3 sets - 10 reps - Standing Hip Abduction with Resistance at Ankles and Counter Support  - 1 x daily - 7 x weekly - 2 sets - 10 reps - Standing Hip Extension with Resistance at Ankles and Counter Support  - 1 x daily - 7 x weekly - 2 sets - 10 reps - Standing March with Unilateral Counter Support  - 1 x daily - 7 x weekly - 2 sets - 10 reps - tandem stance balance; try not to hold on  - 1 x daily - 7 x weekly - 1 sets - 5 reps - 15 sec hold Access Code: [email or web address] URL: [email or web address] Date: 04/21/2024 Prepared by: Lang Ada  Exercises - Supine Bridge  - 1 x daily - 7 x weekly - 3 sets - 10 reps - Sit to Stand Without Arm Support  - 1 x daily - 7 x weekly - 3 sets - 10 reps -  Clamshell  - 1 x daily - 7 x weekly - 3 sets - 10 reps  ASSESSMENT:  CLINICAL IMPRESSION: Patient continues to demonstrate decreased LE strength, decreased gait quality and balance. Patient also demonstrates decreased endurance with aerobic based exercise during today's session. Patient able to initiate progress dynamic balance and core activation exercises today with good performance with verbal cueing. Patient would continue to benefit from skilled physical therapy for increased endurance with ambulation, increased LE strength, and improved balance for improved quality of life, improved independence with gait training and continued progress towards therapy goals.    Progress note today for 10th visit.  Decline in LEFS score and slight decline in sit to stand score. Questioned patient about possible UTI but she denies any symptoms. Patient generally with flat effect.  Explained to patient the importance of compliance with HEP for good outcomes.   Overall demonstrates improvement with 2 MWT and strength; will benefit from continued skilled therapy services to address remaining unmet and partially met goals.   Patient will benefit from continued skilled therapy services to address deficits and promote return to optimal function.      Evaluation: Patient is a 83 y.o. female who was seen today for physical therapy evaluation and treatment for S72.002A (ICD-10-CM) - Closed displaced fracture of left femoral neck (HCC).  Patient demonstrates decreased LLE  strength, abnormal pain in left hip with prolonged standing and walking, and impaired balance. Patient also demonstrates difficulty with ambulation during today's session with decreased left stride length, stance time, and velocity noted. Patient also demonstrates decreased quality of movement with bed mobility as it is painful rolling onto left side. Patient requires verbal and tactile cues for HEP prescription and proper form. Patient would benefit from  skilled physical therapy for decreased pain and increased endurance with ambulation, increased LE strength, and balance for improved gait quality, return to higher level of function with ADLs, and progress towards therapy goals.   OBJECTIVE IMPAIRMENTS: Abnormal gait, decreased activity tolerance, decreased balance, decreased endurance, decreased mobility, difficulty walking, decreased strength, and pain.   ACTIVITY LIMITATIONS: carrying, lifting, bending, standing, squatting, stairs, transfers, bed mobility, and locomotion level  PARTICIPATION LIMITATIONS: meal prep, cleaning, laundry, driving, shopping, community activity, and yard work  PERSONAL FACTORS: Age, Fitness, Past/current experiences, and 1 comorbidity: recent fall and hip fx are also affecting patient's functional outcome.   REHAB POTENTIAL: Good  CLINICAL DECISION MAKING: Stable/uncomplicated  EVALUATION COMPLEXITY: Low   GOALS: Goals reviewed with patient? Yes  SHORT TERM GOALS: Target date: 05/12/24  Patient will demonstrate evidence of independence with individualized HEP and will report compliance for at least 3 days per week for optimized progression towards remaining therapy goals. Baseline:  Goal status: IN PROGRESS  2.  Patient will report a decrease in pain level during community ambulation by at least 2 points for improved quality of life. Baseline: 2/10 Goal status: MET     LONG TERM GOALS: Target date: 06/02/24  Pt will demonstrate a an increase of at least 9 points on the LEFS for improved performance of community ambulation and ADL. Baseline: see above Goal status: IN PROGRESS  2.  Pt will improve 2 MWT by 140  in order to demonstrate improved functional ambulatory capacity in community setting.  Baseline: see above; improved by 53 ft Goal status: IN PROGRESS  3.  Pt will demonstrate WFL pain free ROM in left hip, for increased mobility and maximal efficiency of gait cycle during  ambulation. Baseline: see objective Goal status: IN PROGRESS  4.  Pt will demonstrate at least 4/5 MMT for left lower extremity for increased strength during ADL and community ambulation. Baseline: see objective Goal status: IN PROGRESS  5.  Pt will improve 5TSTS by at least 2.3 seconds in order to improve functional strength during functional transfers. Baseline: see above Goal status: IN PROGRESS    PLAN:  PT FREQUENCY: 1-2x/week  PT DURATION: 6 weeks  PLANNED INTERVENTIONS: 97110-Therapeutic exercises, 97530- Therapeutic activity, [id number]- Neuromuscular re-education, 97535- Self Care, [id number]- Manual therapy, [phone]- Gait training, Patient/Family education, Balance training, Stair training, Joint mobilization, DME instructions, Cryotherapy, and Moist heat  PLAN FOR NEXT SESSION: Progress functional strengthening and balance. Continue 2 x a week for 4 more weeks to address remaining unmet and partially met goals   Lang Ada, PT, DPT [facility] Office: [phone] 11:03 AM, 06/01/24

## 2024-06-04 ENCOUNTER — Encounter (HOSPITAL_COMMUNITY): Payer: Self-pay

## 2024-06-04 ENCOUNTER — Ambulatory Visit (HOSPITAL_COMMUNITY)

## 2024-06-04 DIAGNOSIS — R29898 Other symptoms and signs involving the musculoskeletal system: Secondary | ICD-10-CM | POA: Diagnosis not present

## 2024-06-04 DIAGNOSIS — Z7409 Other reduced mobility: Secondary | ICD-10-CM | POA: Diagnosis not present

## 2024-06-04 DIAGNOSIS — M25552 Pain in left hip: Secondary | ICD-10-CM | POA: Diagnosis not present

## 2024-06-04 NOTE — Therapy (Signed)
 OUTPATIENT PHYSICAL THERAPY TREATMENT  Patient Name: Tiffany Everett MRN: [id number] DOB:[birth year], 83 y.o., female Today's Date: 06/04/2024  END OF SESSION:  PT End of Session - 06/04/24 1143     Visit Number 12    Number of Visits 20    Date for PT Re-Evaluation 06/25/24    Authorization Type [employer] MEDICARE HMO/PPO    Progress Note Due on Visit 10    PT Start Time 1143    PT Stop Time 1221    PT Time Calculation (min) 38 min    Equipment Utilized During Treatment Gait belt    Activity Tolerance Patient tolerated treatment well    Behavior During Therapy WFL for tasks assessed/performed             Past Medical History:  Diagnosis Date   Colon polyps    Hypertension    Seasonal allergies    Past Surgical History:  Procedure Laterality Date   APPENDECTOMY     CATARACT EXTRACTION W/PHACO Right 09/13/2013   Procedure: CATARACT EXTRACTION PHACO AND INTRAOCULAR LENS PLACEMENT (IOC);  Surgeon: Dow JULIANNA Burke, MD;  Location: [facility];  Service: Ophthalmology;  Laterality: Right;  CDE:3.25   CATARACT EXTRACTION W/PHACO Left 11/22/2013   Procedure: CATARACT EXTRACTION PHACO AND INTRAOCULAR LENS PLACEMENT (IOC);  Surgeon: Dow JULIANNA Burke, MD;  Location: [facility];  Service: Ophthalmology;  Laterality: Left;  CDE: 3.95   CESAREAN SECTION     x2   COLONOSCOPY     SLF 2010: sigmoid diverticula, otherwise wnl. Repeat 5 yrs.   HIP ARTHROPLASTY Left 02/25/2024   Procedure: HEMIARTHROPLASTY (BIPOLAR) HIP;  Surgeon: Margrette Taft BRAVO, MD;  Location: [facility];  Service: Orthopedics;  Laterality: Left;   I & D EXTREMITY Left    lateral wrist   LAPAROTOMY     oopherectomy   ORIF HUMERUS FRACTURE Left    Patient Active Problem List   Diagnosis Date Noted   Neurocognitive deficits 03/02/2024   Gastroesophageal reflux disease 02/27/2024   Closed displaced fracture of left femoral neck (HCC) 02/24/2024   Chronic kidney disease (CKD) stage G3a/A1, moderately decreased glomerular  filtration rate (GFR) between 45-59 mL/min/1.73 square meter and albuminuria creatinine ratio less than 30 mg/g (HCC) 05/23/2023   Left renal atrophy 05/08/2022   Enlarged thyroid  11/07/2021   Fatigue 04/23/2021   Irregular heartbeat 04/23/2021   Mixed hyperlipidemia 04/23/2021   Vitamin D  insufficiency 04/23/2021   Borderline prolonged QT interval 02/02/2020   Polycythemia 02/01/2020   Essential hypertension    History of colonic polyps 04/24/2015    PCP: Suanne Pfeiffer, NP   REFERRING PROVIDER: Margrette Taft BRAVO, MD  REFERRING DIAG: [phone] (ICD-10-CM) - Closed displaced fracture of left femoral neck (HCC)  THERAPY DIAG:  Pain in left hip  Weakness of left lower extremity  Impaired functional mobility, balance, gait, and endurance  Rationale for Evaluation and Treatment: Rehabilitation  ONSET DATE: Early this year, does not remember date  SUBJECTIVE:   SUBJECTIVE STATEMENT: Pt states she was sore for about a day after last session. Pt states she is not in any pain upon presentation.  Evaluation: Pt states getting up early in the morning got 3/4 to the bathroom and all of a sudden left leg collapsed and fell on left side, felt immediate pain. Pt had surgery for hip fx and then went to a rehab for 2 weeks. Pt then had a couple of weeks of home PT after that. Pt states that she is not in pain and  states the hip does not hurt much anymore just feels weak on that side. Before the fall she was not using a walker and was driving.  PERTINENT HISTORY: Hip Fx PAIN:  Are you having pain? No and Yes: NPRS scale: 3/10 Pain location: Lt hip Pain description: Sharp Aggravating factors: Positioning Relieving factors: Rearrange positioning  PRECAUTIONS: Fall  RED FLAGS: None   WEIGHT BEARING RESTRICTIONS: No  FALLS:  Has patient fallen in last 6 months? Yes. Number of falls 1  LIVING ENVIRONMENT: Lives with: lives with their family and lives with their son Lives in:  Mobile home Stairs: ramped entrance Has following equipment at home: Single point cane, Environmental consultant - 2 wheeled, shower chair, and Ramped entry  OCCUPATION: retired  PLOF: Independent  PATIENT GOALS: To get back where she was; walk without the walker, get stronger left lower extremity. Pt would like to return to yard work and driving as well.  NEXT MD VISIT: maybe next Friday   OBJECTIVE:  Note: Objective measures were completed at Evaluation unless otherwise noted.  DIAGNOSTIC FINDINGS: CLINICAL DATA:  Fall and left hip pain.   EXAM: DG HIP (WITH OR WITHOUT PELVIS) 2-3V LEFT   COMPARISON:  Pelvic radiograph dated 02/01/2020.   FINDINGS: There is a mildly displaced and impacted fracture of the left femoral neck. No dislocation. The bones are osteopenic. The soft tissues are unremarkable.   IMPRESSION: Mildly displaced and impacted fracture of the left femoral neck.  PATIENT SURVEYS:  LEFS 39/80  COGNITION: Overall cognitive status: Within functional limits for tasks assessed     SENSATION: WFL  EDEMA:  None observed, none reported    PALPATION: Pt reports no tenderness of left hip, assess OP next session  LOWER EXTREMITY ROM:  Active ROM Right eval Left eval  Hip flexion 102 100  Hip extension 5/20: 5 5/20: 0  Hip abduction    Hip adduction    Hip internal rotation    Hip external rotation    Knee flexion    Knee extension    Ankle dorsiflexion    Ankle plantarflexion    Ankle inversion    Ankle eversion     (Blank rows = not tested)  LOWER EXTREMITY MMT:  MMT Right eval Left eval Left 05/24/24  Hip flexion 4 3+ 4  Hip extension 4 3+ 2  Hip abduction 4 3+ 2  Hip adduction 4 3+   Hip internal rotation     Hip external rotation     Knee flexion     Knee extension   4  Ankle dorsiflexion (limited range bilat)   Limited range 4  Ankle plantarflexion     Ankle inversion     Ankle eversion      (Blank rows = not tested)   FUNCTIONAL TESTS:   5 times sit to stand: 16.05 2 minute walk test: 182 feet  04/27/24:  Functional Gait Assessment Summary completed with RW 1. GAIT LEVEL SURFACE: Moderate impairment -- gait level surface (1) (1 points) 2. CHANGE IN GAIT SPEED: Moderate impairment -- change in gait speed (1) (1 points) 3. GAIT WITH HORIZONTAL HEAD TURNS: Moderate impairment -- gait with horizontal head turns (1) (1 points) 4. GAIT WITH VERTICAL HEAD TURNS: Moderate impairment -- gait with vertical head turns (1) (1 points) 5. GAIT AND PIVOT TURN: Moderate impairment -- gait and pivot turn (1) (1 points) 6. STEP OVER OBSTACLE: Moderate impairment -- step over obstacle (1) (1 points) 7. GAIT WITH NARROW BASE OF  SUPPORT: Moderate impairment -- gait with narrow base of support (1) (1 points) 8. GAIT WITH EYES CLOSED: Severe impairment -- gait with eyes closed (0) (0 points) 9. AMBULATING BACKWARDS: Moderate impairment -- ambulating backwards (1) (1 points) 10. STEPS: Mild impairment -- up and down steps (2) (2 points) Functional Gait Assessment: 10/30=33.3 percent. (Normal for age >20/30)   GAIT: Distance walked: 182 feet Assistive device utilized: Walker - 2 wheeled Level of assistance: Modified independence Comments: Pt demonstrates decreased stride length and stance time on left side.                                                                                                                                TREATMENT DATE:  06/04/2024  Therapeutic Exercise: -Treadmill, 5 minutes, level 2.0 grade, 0.8 speed, pt cued for upright posture and big steps -Leg press, 3 sets of 10 reps, plate 3, 4 and 5, pt cued for eccentric control -2 bouts of 15 feet ambulation with no AD, therapist CGA to leg press and back, pt demonstrates trendelenburg gait pattern  Neuromuscular Reeducation: -Aeromat walks, lateral stepping and tandem walks, 2 laps in parallel bars each variation  -Speed step ups, 2 sets of 10 reps bilaterally in  parallel bars, 8 inch step, one UE utilized, 30 second bouts -Left TKE on 4 inch step, pt cued for level hips throughout movement for increased activation of left glue med   06/01/2024  Therapeutic Exercise: -Supine bridges 1 sets of 10 reps, 3 second holds, symptomatic, pt cued for max hip extension -Walking Marches/butt kicks with 3 lb cuffs, 2 laps of each variation, 20 foot laps, SPC and CGA for saftey -Lateral stepping 2 laps 20 feet per lap, with 3 lb cuff around ankles, pt cued for upright posture -Ambulation with SPC and gait belt x 1 lap PT gym ~220 ft   Therapeutic Activity: -Sit to stands, 2 sets of 6 reps, with red trampoline toss, 3 tosses each rep -Step up and overs, 2 sets of 8 reps bilaterally in parallel bars, 6 inch step -Lateral step up and overs, 2 sets of 7 reps bilaterally, in parallel bars, BUE support required, 6 inch step   05/27/24 Progress note LEFS 35/80 43.8% 5 times sit to stand 23.46 sec needing multiple attempts on initial stand MMT's and 2 MWT done last session Goal review Standing heel raises on incline 2 x 10 Toe raises 2 x 10 Hip abduction 2 x 10 each with 1 UE assist Hip extension with 1 UE assist 2 x 10 Tandem stance 30 x 3 each Squat to chair for target 2 x 10    PATIENT EDUCATION:  Education details: Pt was educated on findings of PT evaluation, prognosis, frequency of therapy visits and rationale, attendance policy, and HEP if given.   Person educated: Patient Education method: Explanation, Verbal cues, and Handouts Education comprehension: verbalized understanding, verbal cues required, tactile cues required, and needs further  education  HOME EXERCISE PROGRAM: Access Code: [email or web address] URL: [email or web address] Date: 05/18/2024 Prepared by: [facility]  Exercises - Standing Hip Abduction with Resistance at Ankles and Counter Support  - 1 x daily - 7 x weekly - 2 sets - 10 reps - Standing Hip Extension with Resistance  at Ankles and Counter Support  - 1 x daily - 7 x weekly - 2 sets - 10 reps - Standing March with Unilateral Counter Support  - 1 x daily - 7 x weekly - 2 sets - 10 reps - tandem stance balance; try not to hold on  - 1 x daily - 7 x weekly - 1 sets - 5 reps - 15 sec hold Access Code: [email or web address] URL: [email or web address] Date: 05/18/2024 Prepared by: [facility]  Exercises - Supine Bridge  - 1 x daily - 7 x weekly - 3 sets - 10 reps - Sit to Stand Without Arm Support  - 1 x daily - 7 x weekly - 3 sets - 10 reps - Clamshell  - 1 x daily - 7 x weekly - 3 sets - 10 reps - Standing Hip Abduction with Resistance at Ankles and Counter Support  - 1 x daily - 7 x weekly - 2 sets - 10 reps - Standing Hip Extension with Resistance at Ankles and Counter Support  - 1 x daily - 7 x weekly - 2 sets - 10 reps - Standing March with Unilateral Counter Support  - 1 x daily - 7 x weekly - 2 sets - 10 reps - tandem stance balance; try not to hold on  - 1 x daily - 7 x weekly - 1 sets - 5 reps - 15 sec hold Access Code: [email or web address] URL: [email or web address] Date: 04/21/2024 Prepared by: Lang Ada  Exercises - Supine Bridge  - 1 x daily - 7 x weekly - 3 sets - 10 reps - Sit to Stand Without Arm Support  - 1 x daily - 7 x weekly - 3 sets - 10 reps - Clamshell  - 1 x daily - 7 x weekly - 3 sets - 10 reps  ASSESSMENT:  CLINICAL IMPRESSION: Patient continues to demonstrate decreased LE strength, specifically left abductors of hip, decreased gait quality and balance. Patient also demonstrates decreased speed with aerobic based exercise during today's session. Patient able to progress dynamic balance and core activation exercises today with treadmill and aeromat walks with UE support and good performance with verbal cueing. Patient would continue to benefit from skilled physical therapy for increased endurance with ambulation, increased LLE strength, and improved balance for improved quality  of life, improved independence with gait training and continued progress towards therapy goals.    Evaluation: Patient is a 83 y.o. female who was seen today for physical therapy evaluation and treatment for S72.002A (ICD-10-CM) - Closed displaced fracture of left femoral neck (HCC).  Patient demonstrates decreased LLE strength, abnormal pain in left hip with prolonged standing and walking, and impaired balance. Patient also demonstrates difficulty with ambulation during today's session with decreased left stride length, stance time, and velocity noted. Patient also demonstrates decreased quality of movement with bed mobility as it is painful rolling onto left side. Patient requires verbal and tactile cues for HEP prescription and proper form. Patient would benefit from skilled physical therapy for decreased pain and increased endurance with ambulation, increased LE strength, and balance for improved gait quality, return to higher level of function with ADLs, and progress towards therapy goals.  OBJECTIVE IMPAIRMENTS: Abnormal gait, decreased activity tolerance, decreased balance, decreased endurance, decreased mobility, difficulty walking, decreased strength, and pain.   ACTIVITY LIMITATIONS: carrying, lifting, bending, standing, squatting, stairs, transfers, bed mobility, and locomotion level  PARTICIPATION LIMITATIONS: meal prep, cleaning, laundry, driving, shopping, community activity, and yard work  PERSONAL FACTORS: Age, Fitness, Past/current experiences, and 1 comorbidity: recent fall and hip fx are also affecting patient's functional outcome.   REHAB POTENTIAL: Good  CLINICAL DECISION MAKING: Stable/uncomplicated  EVALUATION COMPLEXITY: Low   GOALS: Goals reviewed with patient? Yes  SHORT TERM GOALS: Target date: 05/12/24  Patient will demonstrate evidence of independence with individualized HEP and will report compliance for at least 3 days per week for optimized progression  towards remaining therapy goals. Baseline:  Goal status: IN PROGRESS  2.  Patient will report a decrease in pain level during community ambulation by at least 2 points for improved quality of life. Baseline: 2/10 Goal status: MET     LONG TERM GOALS: Target date: 06/02/24  Pt will demonstrate a an increase of at least 9 points on the LEFS for improved performance of community ambulation and ADL. Baseline: see above Goal status: IN PROGRESS  2.  Pt will improve 2 MWT by 140  in order to demonstrate improved functional ambulatory capacity in community setting.  Baseline: see above; improved by 53 ft Goal status: IN PROGRESS  3.  Pt will demonstrate WFL pain free ROM in left hip, for increased mobility and maximal efficiency of gait cycle during ambulation. Baseline: see objective Goal status: IN PROGRESS  4.  Pt will demonstrate at least 4/5 MMT for left lower extremity for increased strength during ADL and community ambulation. Baseline: see objective Goal status: IN PROGRESS  5.  Pt will improve 5TSTS by at least 2.3 seconds in order to improve functional strength during functional transfers. Baseline: see above Goal status: IN PROGRESS    PLAN:  PT FREQUENCY: 1-2x/week  PT DURATION: 6 weeks  PLANNED INTERVENTIONS: 97110-Therapeutic exercises, 97530- Therapeutic activity, [id number]- Neuromuscular re-education, 97535- Self Care, [id number]- Manual therapy, [phone]- Gait training, Patient/Family education, Balance training, Stair training, Joint mobilization, DME instructions, Cryotherapy, and Moist heat  PLAN FOR NEXT SESSION: Progress functional strengthening and balance. Continue 2 x a week for 4 more weeks to address remaining unmet and partially met goals   Lang Ada, PT, DPT [facility] Office: [phone] 12:25 PM, 06/04/24

## 2024-06-08 ENCOUNTER — Ambulatory Visit (HOSPITAL_COMMUNITY): Attending: Orthopedic Surgery

## 2024-06-08 DIAGNOSIS — R29898 Other symptoms and signs involving the musculoskeletal system: Secondary | ICD-10-CM | POA: Insufficient documentation

## 2024-06-08 DIAGNOSIS — M25552 Pain in left hip: Secondary | ICD-10-CM | POA: Diagnosis not present

## 2024-06-08 DIAGNOSIS — Z7409 Other reduced mobility: Secondary | ICD-10-CM | POA: Insufficient documentation

## 2024-06-08 NOTE — Therapy (Signed)
 OUTPATIENT PHYSICAL THERAPY TREATMENT  Patient Name: Tiffany Everett MRN: [id number] DOB:05/16/1941, 83 y.o., female Today's Date: 06/08/2024  END OF SESSION:  PT End of Session - 06/08/24 0936     Visit Number 13    Number of Visits 20    Date for PT Re-Evaluation 06/25/24    Authorization Type [employer] MEDICARE HMO/PPO    Progress Note Due on Visit 10    PT Start Time 0935    PT Stop Time 1015    PT Time Calculation (min) 40 min    Equipment Utilized During Treatment Gait belt    Activity Tolerance Patient tolerated treatment well    Behavior During Therapy WFL for tasks assessed/performed             Past Medical History:  Diagnosis Date   Colon polyps    Hypertension    Seasonal allergies    Past Surgical History:  Procedure Laterality Date   APPENDECTOMY     CATARACT EXTRACTION W/PHACO Right 09/13/2013   Procedure: CATARACT EXTRACTION PHACO AND INTRAOCULAR LENS PLACEMENT (IOC);  Surgeon: Dow JULIANNA Burke, MD;  Location: [facility];  Service: Ophthalmology;  Laterality: Right;  CDE:3.25   CATARACT EXTRACTION W/PHACO Left 11/22/2013   Procedure: CATARACT EXTRACTION PHACO AND INTRAOCULAR LENS PLACEMENT (IOC);  Surgeon: Dow JULIANNA Burke, MD;  Location: [facility];  Service: Ophthalmology;  Laterality: Left;  CDE: 3.95   CESAREAN SECTION     x2   COLONOSCOPY     SLF 2010: sigmoid diverticula, otherwise wnl. Repeat 5 yrs.   HIP ARTHROPLASTY Left 02/25/2024   Procedure: HEMIARTHROPLASTY (BIPOLAR) HIP;  Surgeon: Margrette Taft BRAVO, MD;  Location: [facility];  Service: Orthopedics;  Laterality: Left;   I & D EXTREMITY Left    lateral wrist   LAPAROTOMY     oopherectomy   ORIF HUMERUS FRACTURE Left    Patient Active Problem List   Diagnosis Date Noted   Neurocognitive deficits 03/02/2024   Gastroesophageal reflux disease 02/27/2024   Closed displaced fracture of left femoral neck (HCC) 02/24/2024   Chronic kidney disease (CKD) stage G3a/A1, moderately decreased glomerular  filtration rate (GFR) between 45-59 mL/min/1.73 square meter and albuminuria creatinine ratio less than 30 mg/g (HCC) 05/23/2023   Left renal atrophy 05/08/2022   Enlarged thyroid  11/07/2021   Fatigue 04/23/2021   Irregular heartbeat 04/23/2021   Mixed hyperlipidemia 04/23/2021   Vitamin D  insufficiency 04/23/2021   Borderline prolonged QT interval 02/02/2020   Polycythemia 02/01/2020   Essential hypertension    History of colonic polyps 04/24/2015    PCP: Suanne Pfeiffer, NP   REFERRING PROVIDER: Margrette Taft BRAVO, MD  REFERRING DIAG: [phone] (ICD-10-CM) - Closed displaced fracture of left femoral neck (HCC)  THERAPY DIAG:  Pain in left hip  Weakness of left lower extremity  Impaired functional mobility, balance, gait, and endurance  Rationale for Evaluation and Treatment: Rehabilitation  ONSET DATE: Early this year, does not remember date  SUBJECTIVE:   SUBJECTIVE STATEMENT: Some soreness rated 4/10 on arrival today; arrives with son and with RW  Evaluation: Pt states getting up early in the morning got 3/4 to the bathroom and all of a sudden left leg collapsed and fell on left side, felt immediate pain. Pt had surgery for hip fx and then went to a rehab for 2 weeks. Pt then had a couple of weeks of home PT after that. Pt states that she is not in pain and states the hip does not hurt much anymore just  feels weak on that side. Before the fall she was not using a walker and was driving.  PERTINENT HISTORY: Hip Fx PAIN:  Are you having pain? No and Yes: NPRS scale: 3/10 Pain location: Lt hip Pain description: Sharp Aggravating factors: Positioning Relieving factors: Rearrange positioning  PRECAUTIONS: Fall  RED FLAGS: None   WEIGHT BEARING RESTRICTIONS: No  FALLS:  Has patient fallen in last 6 months? Yes. Number of falls 1  LIVING ENVIRONMENT: Lives with: lives with their family and lives with their son Lives in: Mobile home Stairs: ramped  entrance Has following equipment at home: Single point cane, Environmental consultant - 2 wheeled, shower chair, and Ramped entry  OCCUPATION: retired  PLOF: Independent  PATIENT GOALS: To get back where she was; walk without the walker, get stronger left lower extremity. Pt would like to return to yard work and driving as well.  NEXT MD VISIT: maybe next Friday   OBJECTIVE:  Note: Objective measures were completed at Evaluation unless otherwise noted.  DIAGNOSTIC FINDINGS: CLINICAL DATA:  Fall and left hip pain.   EXAM: DG HIP (WITH OR WITHOUT PELVIS) 2-3V LEFT   COMPARISON:  Pelvic radiograph dated 02/01/2020.   FINDINGS: There is a mildly displaced and impacted fracture of the left femoral neck. No dislocation. The bones are osteopenic. The soft tissues are unremarkable.   IMPRESSION: Mildly displaced and impacted fracture of the left femoral neck.  PATIENT SURVEYS:  LEFS 39/80  COGNITION: Overall cognitive status: Within functional limits for tasks assessed     SENSATION: WFL  EDEMA:  None observed, none reported    PALPATION: Pt reports no tenderness of left hip, assess OP next session  LOWER EXTREMITY ROM:  Active ROM Right eval Left eval  Hip flexion 102 100  Hip extension 5/20: 5 5/20: 0  Hip abduction    Hip adduction    Hip internal rotation    Hip external rotation    Knee flexion    Knee extension    Ankle dorsiflexion    Ankle plantarflexion    Ankle inversion    Ankle eversion     (Blank rows = not tested)  LOWER EXTREMITY MMT:  MMT Right eval Left eval Left 05/24/24  Hip flexion 4 3+ 4  Hip extension 4 3+ 2  Hip abduction 4 3+ 2  Hip adduction 4 3+   Hip internal rotation     Hip external rotation     Knee flexion     Knee extension   4  Ankle dorsiflexion (limited range bilat)   Limited range 4  Ankle plantarflexion     Ankle inversion     Ankle eversion      (Blank rows = not tested)   FUNCTIONAL TESTS:  5 times sit to stand:  16.05 2 minute walk test: 182 feet  04/27/24:  Functional Gait Assessment Summary completed with RW 1. GAIT LEVEL SURFACE: Moderate impairment -- gait level surface (1) (1 points) 2. CHANGE IN GAIT SPEED: Moderate impairment -- change in gait speed (1) (1 points) 3. GAIT WITH HORIZONTAL HEAD TURNS: Moderate impairment -- gait with horizontal head turns (1) (1 points) 4. GAIT WITH VERTICAL HEAD TURNS: Moderate impairment -- gait with vertical head turns (1) (1 points) 5. GAIT AND PIVOT TURN: Moderate impairment -- gait and pivot turn (1) (1 points) 6. STEP OVER OBSTACLE: Moderate impairment -- step over obstacle (1) (1 points) 7. GAIT WITH NARROW BASE OF SUPPORT: Moderate impairment -- gait with narrow base of  support (1) (1 points) 8. GAIT WITH EYES CLOSED: Severe impairment -- gait with eyes closed (0) (0 points) 9. AMBULATING BACKWARDS: Moderate impairment -- ambulating backwards (1) (1 points) 10. STEPS: Mild impairment -- up and down steps (2) (2 points) Functional Gait Assessment: 10/30=33.3 percent. (Normal for age >20/30)   GAIT: Distance walked: 182 feet Assistive device utilized: Walker - 2 wheeled Level of assistance: Modified independence Comments: Pt demonstrates decreased stride length and stance time on left side.                                                                                                                                TREATMENT DATE:  06/08/24 Treadmill x 5 min 0.6 mph to 0.8 mph Sit to stand with left leg on 2 step x 10 no UE assist occassional min A to come fully to stand 4 box step ups 2 x 10 4 lateral step ups 2 x 10 3# hip abduction and extension 2 x 10 each Tandem stance x 30 each no UE assist Nustep seat 9 x 5' level 3 to end treatment   06/04/2024  Therapeutic Exercise: -Treadmill, 5 minutes, level 2.0 grade, 0.8 speed, pt cued for upright posture and big steps -Leg press, 3 sets of 10 reps, plate 3, 4 and 5, pt cued for eccentric  control -2 bouts of 15 feet ambulation with no AD, therapist CGA to leg press and back, pt demonstrates trendelenburg gait pattern  Neuromuscular Reeducation: -Aeromat walks, lateral stepping and tandem walks, 2 laps in parallel bars each variation  -Speed step ups, 2 sets of 10 reps bilaterally in parallel bars, 8 inch step, one UE utilized, 30 second bouts -Left TKE on 4 inch step, pt cued for level hips throughout movement for increased activation of left glue med   06/01/2024  Therapeutic Exercise: -Supine bridges 1 sets of 10 reps, 3 second holds, symptomatic, pt cued for max hip extension -Walking Marches/butt kicks with 3 lb cuffs, 2 laps of each variation, 20 foot laps, SPC and CGA for saftey -Lateral stepping 2 laps 20 feet per lap, with 3 lb cuff around ankles, pt cued for upright posture -Ambulation with SPC and gait belt x 1 lap PT gym ~220 ft   Therapeutic Activity: -Sit to stands, 2 sets of 6 reps, with red trampoline toss, 3 tosses each rep -Step up and overs, 2 sets of 8 reps bilaterally in parallel bars, 6 inch step -Lateral step up and overs, 2 sets of 7 reps bilaterally, in parallel bars, BUE support required, 6 inch step   05/27/24 Progress note LEFS 35/80 43.8% 5 times sit to stand 23.46 sec needing multiple attempts on initial stand MMT's and 2 MWT done last session Goal review Standing heel raises on incline 2 x 10 Toe raises 2 x 10 Hip abduction 2 x 10 each with 1 UE assist Hip extension with 1 UE assist 2 x 10 Tandem stance  30 x 3 each Squat to chair for target 2 x 10    PATIENT EDUCATION:  Education details: Pt was educated on findings of PT evaluation, prognosis, frequency of therapy visits and rationale, attendance policy, and HEP if given.   Person educated: Patient Education method: Explanation, Verbal cues, and Handouts Education comprehension: verbalized understanding, verbal cues required, tactile cues required, and needs further  education  HOME EXERCISE PROGRAM: Access Code: [email or web address] URL: [email or web address] Date: 05/18/2024 Prepared by: [facility]  Exercises - Standing Hip Abduction with Resistance at Ankles and Counter Support  - 1 x daily - 7 x weekly - 2 sets - 10 reps - Standing Hip Extension with Resistance at Ankles and Counter Support  - 1 x daily - 7 x weekly - 2 sets - 10 reps - Standing March with Unilateral Counter Support  - 1 x daily - 7 x weekly - 2 sets - 10 reps - tandem stance balance; try not to hold on  - 1 x daily - 7 x weekly - 1 sets - 5 reps - 15 sec hold Access Code: [email or web address] URL: [email or web address] Date: 05/18/2024 Prepared by: [facility]  Exercises - Supine Bridge  - 1 x daily - 7 x weekly - 3 sets - 10 reps - Sit to Stand Without Arm Support  - 1 x daily - 7 x weekly - 3 sets - 10 reps - Clamshell  - 1 x daily - 7 x weekly - 3 sets - 10 reps - Standing Hip Abduction with Resistance at Ankles and Counter Support  - 1 x daily - 7 x weekly - 2 sets - 10 reps - Standing Hip Extension with Resistance at Ankles and Counter Support  - 1 x daily - 7 x weekly - 2 sets - 10 reps - Standing March with Unilateral Counter Support  - 1 x daily - 7 x weekly - 2 sets - 10 reps - tandem stance balance; try not to hold on  - 1 x daily - 7 x weekly - 1 sets - 5 reps - 15 sec hold Access Code: [email or web address] URL: [email or web address] Date: 04/21/2024 Prepared by: Lang Ada  Exercises - Supine Bridge  - 1 x daily - 7 x weekly - 3 sets - 10 reps - Sit to Stand Without Arm Support  - 1 x daily - 7 x weekly - 3 sets - 10 reps - Clamshell  - 1 x daily - 7 x weekly - 3 sets - 10 reps  ASSESSMENT:  CLINICAL IMPRESSION: Patient continues to demonstrate decreased LE strength, specifically left abductors of hip, decreased gait quality and balance. Patient continues with walk with shuffling type gait; forward flexed trunk and needs continued cues to improve  posture and to increase step length.   Limited motion available for hip extension; tends to flex the knee versus extend the hip.  Patient often with uncontrolled descent with stand to sit.  Needs multiple cues with step ups for technique.   Patient would continue to benefit from skilled physical therapy for increased endurance with ambulation, increased LLE strength, and improved balance for improved quality of life, improved independence with gait training and continued progress towards therapy goals.    Evaluation: Patient is a 83 y.o. female who was seen today for physical therapy evaluation and treatment for S72.002A (ICD-10-CM) - Closed displaced fracture of left femoral neck (HCC).  Patient demonstrates decreased LLE strength, abnormal pain in left hip with prolonged standing and walking, and  impaired balance. Patient also demonstrates difficulty with ambulation during today's session with decreased left stride length, stance time, and velocity noted. Patient also demonstrates decreased quality of movement with bed mobility as it is painful rolling onto left side. Patient requires verbal and tactile cues for HEP prescription and proper form. Patient would benefit from skilled physical therapy for decreased pain and increased endurance with ambulation, increased LE strength, and balance for improved gait quality, return to higher level of function with ADLs, and progress towards therapy goals.   OBJECTIVE IMPAIRMENTS: Abnormal gait, decreased activity tolerance, decreased balance, decreased endurance, decreased mobility, difficulty walking, decreased strength, and pain.   ACTIVITY LIMITATIONS: carrying, lifting, bending, standing, squatting, stairs, transfers, bed mobility, and locomotion level  PARTICIPATION LIMITATIONS: meal prep, cleaning, laundry, driving, shopping, community activity, and yard work  PERSONAL FACTORS: Age, Fitness, Past/current experiences, and 1 comorbidity: recent fall and  hip fx are also affecting patient's functional outcome.   REHAB POTENTIAL: Good  CLINICAL DECISION MAKING: Stable/uncomplicated  EVALUATION COMPLEXITY: Low   GOALS: Goals reviewed with patient? Yes  SHORT TERM GOALS: Target date: 05/12/24  Patient will demonstrate evidence of independence with individualized HEP and will report compliance for at least 3 days per week for optimized progression towards remaining therapy goals. Baseline:  Goal status: IN PROGRESS  2.  Patient will report a decrease in pain level during community ambulation by at least 2 points for improved quality of life. Baseline: 2/10 Goal status: MET     LONG TERM GOALS: Target date: 06/02/24  Pt will demonstrate a an increase of at least 9 points on the LEFS for improved performance of community ambulation and ADL. Baseline: see above Goal status: IN PROGRESS  2.  Pt will improve 2 MWT by 140  in order to demonstrate improved functional ambulatory capacity in community setting.  Baseline: see above; improved by 53 ft Goal status: IN PROGRESS  3.  Pt will demonstrate WFL pain free ROM in left hip, for increased mobility and maximal efficiency of gait cycle during ambulation. Baseline: see objective Goal status: IN PROGRESS  4.  Pt will demonstrate at least 4/5 MMT for left lower extremity for increased strength during ADL and community ambulation. Baseline: see objective Goal status: IN PROGRESS  5.  Pt will improve 5TSTS by at least 2.3 seconds in order to improve functional strength during functional transfers. Baseline: see above Goal status: IN PROGRESS    PLAN:  PT FREQUENCY: 1-2x/week  PT DURATION: 6 weeks  PLANNED INTERVENTIONS: 97110-Therapeutic exercises, 97530- Therapeutic activity, [id number]- Neuromuscular re-education, 97535- Self Care, [id number]- Manual therapy, [phone]- Gait training, Patient/Family education, Balance training, Stair training, Joint mobilization, DME instructions,  Cryotherapy, and Moist heat  PLAN FOR NEXT SESSION: Progress functional strengthening and balance. Continue 2 x a week for 4 more weeks to address remaining unmet and partially met goals   10:12 AM, 06/08/24 Jamason Peckham Small Blanca Thornton MPT [place] [facility] [place] [phone]

## 2024-06-10 ENCOUNTER — Ambulatory Visit (HOSPITAL_COMMUNITY)

## 2024-06-10 ENCOUNTER — Encounter (HOSPITAL_COMMUNITY): Payer: Self-pay

## 2024-06-10 DIAGNOSIS — I499 Cardiac arrhythmia, unspecified: Secondary | ICD-10-CM | POA: Diagnosis not present

## 2024-06-10 DIAGNOSIS — I5022 Chronic systolic (congestive) heart failure: Secondary | ICD-10-CM | POA: Diagnosis not present

## 2024-06-10 DIAGNOSIS — Z7409 Other reduced mobility: Secondary | ICD-10-CM

## 2024-06-10 DIAGNOSIS — I491 Atrial premature depolarization: Secondary | ICD-10-CM | POA: Diagnosis not present

## 2024-06-10 DIAGNOSIS — R29898 Other symptoms and signs involving the musculoskeletal system: Secondary | ICD-10-CM | POA: Diagnosis not present

## 2024-06-10 DIAGNOSIS — M25552 Pain in left hip: Secondary | ICD-10-CM | POA: Diagnosis not present

## 2024-06-10 NOTE — Therapy (Signed)
 OUTPATIENT PHYSICAL THERAPY TREATMENT  Patient Name: Tiffany Everett MRN: [id number] DOB:08-22-1941, 83 y.o., female Today's Date: 06/10/2024  END OF SESSION:  PT End of Session - 06/10/24 1259     Visit Number 14    Number of Visits 20    Date for PT Re-Evaluation 06/25/24    Authorization Type [employer] MEDICARE HMO/PPO    Progress Note Due on Visit 10    PT Start Time 1301    PT Stop Time 1344    PT Time Calculation (min) 43 min    Activity Tolerance Patient tolerated treatment well    Behavior During Therapy WFL for tasks assessed/performed             Past Medical History:  Diagnosis Date   Colon polyps    Hypertension    Seasonal allergies    Past Surgical History:  Procedure Laterality Date   APPENDECTOMY     CATARACT EXTRACTION W/PHACO Right 09/13/2013   Procedure: CATARACT EXTRACTION PHACO AND INTRAOCULAR LENS PLACEMENT (IOC);  Surgeon: Dow JULIANNA Burke, MD;  Location: [facility];  Service: Ophthalmology;  Laterality: Right;  CDE:3.25   CATARACT EXTRACTION W/PHACO Left 11/22/2013   Procedure: CATARACT EXTRACTION PHACO AND INTRAOCULAR LENS PLACEMENT (IOC);  Surgeon: Dow JULIANNA Burke, MD;  Location: [facility];  Service: Ophthalmology;  Laterality: Left;  CDE: 3.95   CESAREAN SECTION     x2   COLONOSCOPY     SLF 2010: sigmoid diverticula, otherwise wnl. Repeat 5 yrs.   HIP ARTHROPLASTY Left 02/25/2024   Procedure: HEMIARTHROPLASTY (BIPOLAR) HIP;  Surgeon: Margrette Taft BRAVO, MD;  Location: [facility];  Service: Orthopedics;  Laterality: Left;   I & D EXTREMITY Left    lateral wrist   LAPAROTOMY     oopherectomy   ORIF HUMERUS FRACTURE Left    Patient Active Problem List   Diagnosis Date Noted   Neurocognitive deficits 03/02/2024   Gastroesophageal reflux disease 02/27/2024   Closed displaced fracture of left femoral neck (HCC) 02/24/2024   Chronic kidney disease (CKD) stage G3a/A1, moderately decreased glomerular filtration rate (GFR) between 45-59 mL/min/1.73 square  meter and albuminuria creatinine ratio less than 30 mg/g (HCC) 05/23/2023   Left renal atrophy 05/08/2022   Enlarged thyroid  11/07/2021   Fatigue 04/23/2021   Irregular heartbeat 04/23/2021   Mixed hyperlipidemia 04/23/2021   Vitamin D  insufficiency 04/23/2021   Borderline prolonged QT interval 02/02/2020   Polycythemia 02/01/2020   Essential hypertension    History of colonic polyps 04/24/2015    PCP: Suanne Pfeiffer, NP   REFERRING PROVIDER: Margrette Taft BRAVO, MD  REFERRING DIAG: [phone] (ICD-10-CM) - Closed displaced fracture of left femoral neck (HCC)  THERAPY DIAG:  Pain in left hip  Weakness of left lower extremity  Impaired functional mobility, balance, gait, and endurance  Rationale for Evaluation and Treatment: Rehabilitation  ONSET DATE: Early this year, does not remember date  SUBJECTIVE:   SUBJECTIVE STATEMENT: Pain is low today, maybe 1-2 sore achey hip pain. Stated some difficulty rolling over in bed.  Evaluation: Pt states getting up early in the morning got 3/4 to the bathroom and all of a sudden left leg collapsed and fell on left side, felt immediate pain. Pt had surgery for hip fx and then went to a rehab for 2 weeks. Pt then had a couple of weeks of home PT after that. Pt states that she is not in pain and states the hip does not hurt much anymore just feels weak on that side.  Before the fall she was not using a walker and was driving.  PERTINENT HISTORY: Hip Fx PAIN:  Are you having pain? No and Yes: NPRS scale: 2/10 Pain location: Lt hip Pain description: Sharp Aggravating factors: Positioning Relieving factors: Rearrange positioning  PRECAUTIONS: Fall  RED FLAGS: None   WEIGHT BEARING RESTRICTIONS: No  FALLS:  Has patient fallen in last 6 months? Yes. Number of falls 1  LIVING ENVIRONMENT: Lives with: lives with their family and lives with their son Lives in: Mobile home Stairs: ramped entrance Has following equipment at home:  Single point cane, Environmental consultant - 2 wheeled, shower chair, and Ramped entry  OCCUPATION: retired  PLOF: Independent  PATIENT GOALS: To get back where she was; walk without the walker, get stronger left lower extremity. Pt would like to return to yard work and driving as well.  NEXT MD VISIT: maybe next Friday   OBJECTIVE:  Note: Objective measures were completed at Evaluation unless otherwise noted.  DIAGNOSTIC FINDINGS: CLINICAL DATA:  Fall and left hip pain.   EXAM: DG HIP (WITH OR WITHOUT PELVIS) 2-3V LEFT   COMPARISON:  Pelvic radiograph dated 02/01/2020.   FINDINGS: There is a mildly displaced and impacted fracture of the left femoral neck. No dislocation. The bones are osteopenic. The soft tissues are unremarkable.   IMPRESSION: Mildly displaced and impacted fracture of the left femoral neck.  PATIENT SURVEYS:  LEFS 39/80  COGNITION: Overall cognitive status: Within functional limits for tasks assessed     SENSATION: WFL  EDEMA:  None observed, none reported    PALPATION: Pt reports no tenderness of left hip, assess OP next session  LOWER EXTREMITY ROM:  Active ROM Right eval Left eval  Hip flexion 102 100  Hip extension 5/20: 5 5/20: 0  Hip abduction    Hip adduction    Hip internal rotation    Hip external rotation    Knee flexion    Knee extension    Ankle dorsiflexion    Ankle plantarflexion    Ankle inversion    Ankle eversion     (Blank rows = not tested)  LOWER EXTREMITY MMT:  MMT Right eval Left eval Left 05/24/24  Hip flexion 4 3+ 4  Hip extension 4 3+ 2  Hip abduction 4 3+ 2  Hip adduction 4 3+   Hip internal rotation     Hip external rotation     Knee flexion     Knee extension   4  Ankle dorsiflexion (limited range bilat)   Limited range 4  Ankle plantarflexion     Ankle inversion     Ankle eversion      (Blank rows = not tested)   FUNCTIONAL TESTS:  5 times sit to stand: 16.05 2 minute walk test: 182 feet  04/27/24:   Functional Gait Assessment Summary completed with RW 1. GAIT LEVEL SURFACE: Moderate impairment -- gait level surface (1) (1 points) 2. CHANGE IN GAIT SPEED: Moderate impairment -- change in gait speed (1) (1 points) 3. GAIT WITH HORIZONTAL HEAD TURNS: Moderate impairment -- gait with horizontal head turns (1) (1 points) 4. GAIT WITH VERTICAL HEAD TURNS: Moderate impairment -- gait with vertical head turns (1) (1 points) 5. GAIT AND PIVOT TURN: Moderate impairment -- gait and pivot turn (1) (1 points) 6. STEP OVER OBSTACLE: Moderate impairment -- step over obstacle (1) (1 points) 7. GAIT WITH NARROW BASE OF SUPPORT: Moderate impairment -- gait with narrow base of support (1) (1 points) 8.  GAIT WITH EYES CLOSED: Severe impairment -- gait with eyes closed (0) (0 points) 9. AMBULATING BACKWARDS: Moderate impairment -- ambulating backwards (1) (1 points) 10. STEPS: Mild impairment -- up and down steps (2) (2 points) Functional Gait Assessment: 10/30=33.3 percent. (Normal for age >20/30)   GAIT: Distance walked: 182 feet Assistive device utilized: Walker - 2 wheeled Level of assistance: Modified independence Comments: Pt demonstrates decreased stride length and stance time on left side.                                                                                                                                TREATMENT DATE:  06/10/24: Bed mobility:  Rolling Standing inside // bars:  -Forward/retro gait with 1 HHA  -Rockerboard Rt/Lt then Df/Pf 2x min each  -Sidestep with RTB 5RT with UE A  -Squat front of chair 10x 3  - Toe tapping alternating 6in step 15x each  - tandem stance with 1 foot on 6in step 2x 30 Nustep x 5' UE/LE  06/08/24 Treadmill x 5 min 0.6 mph to 0.8 mph Sit to stand with left leg on 2 step x 10 no UE assist occassional min A to come fully to stand 4 box step ups 2 x 10 4 lateral step ups 2 x 10 3# hip abduction and extension 2 x 10 each Tandem stance x 30  each no UE assist Nustep seat 9 x 5' level 3 to end treatment   06/04/2024  Therapeutic Exercise: -Treadmill, 5 minutes, level 2.0 grade, 0.8 speed, pt cued for upright posture and big steps -Leg press, 3 sets of 10 reps, plate 3, 4 and 5, pt cued for eccentric control -2 bouts of 15 feet ambulation with no AD, therapist CGA to leg press and back, pt demonstrates trendelenburg gait pattern  Neuromuscular Reeducation: -Aeromat walks, lateral stepping and tandem walks, 2 laps in parallel bars each variation  -Speed step ups, 2 sets of 10 reps bilaterally in parallel bars, 8 inch step, one UE utilized, 30 second bouts -Left TKE on 4 inch step, pt cued for level hips throughout movement for increased activation of left glue med   06/01/2024  Therapeutic Exercise: -Supine bridges 1 sets of 10 reps, 3 second holds, symptomatic, pt cued for max hip extension -Walking Marches/butt kicks with 3 lb cuffs, 2 laps of each variation, 20 foot laps, SPC and CGA for saftey -Lateral stepping 2 laps 20 feet per lap, with 3 lb cuff around ankles, pt cued for upright posture -Ambulation with SPC and gait belt x 1 lap PT gym ~220 ft   Therapeutic Activity: -Sit to stands, 2 sets of 6 reps, with red trampoline toss, 3 tosses each rep -Step up and overs, 2 sets of 8 reps bilaterally in parallel bars, 6 inch step -Lateral step up and overs, 2 sets of 7 reps bilaterally, in parallel bars, BUE support required, 6 inch step   05/27/24 Progress note  LEFS 35/80 43.8% 5 times sit to stand 23.46 sec needing multiple attempts on initial stand MMT's and 2 MWT done last session Goal review Standing heel raises on incline 2 x 10 Toe raises 2 x 10 Hip abduction 2 x 10 each with 1 UE assist Hip extension with 1 UE assist 2 x 10 Tandem stance 30 x 3 each Squat to chair for target 2 x 10    PATIENT EDUCATION:  Education details: Pt was educated on findings of PT evaluation, prognosis, frequency of therapy  visits and rationale, attendance policy, and HEP if given.   Person educated: Patient Education method: Explanation, Verbal cues, and Handouts Education comprehension: verbalized understanding, verbal cues required, tactile cues required, and needs further education  HOME EXERCISE PROGRAM: Access Code: [email or web address] URL: [email or web address] Date: 05/18/2024 Prepared by: [facility]  06/10/24: - Side Stepping with Resistance at Thighs and Counter Support  - 1-2 x daily - 7 x weekly - 2 sets - 10 reps - Squat with Chair and Counter Support  - 1 x daily - 7 x weekly - 3 sets - 10 reps  Exercises - Standing Hip Abduction with Resistance at Ankles and Counter Support  - 1 x daily - 7 x weekly - 2 sets - 10 reps - Standing Hip Extension with Resistance at Ankles and Counter Support  - 1 x daily - 7 x weekly - 2 sets - 10 reps - Standing March with Unilateral Counter Support  - 1 x daily - 7 x weekly - 2 sets - 10 reps - tandem stance balance; try not to hold on  - 1 x daily - 7 x weekly - 1 sets - 5 reps - 15 sec hold Access Code: [email or web address] URL: [email or web address] Date: 05/18/2024 Prepared by: [facility]  Exercises - Supine Bridge  - 1 x daily - 7 x weekly - 3 sets - 10 reps - Sit to Stand Without Arm Support  - 1 x daily - 7 x weekly - 3 sets - 10 reps - Clamshell  - 1 x daily - 7 x weekly - 3 sets - 10 reps - Standing Hip Abduction with Resistance at Ankles and Counter Support  - 1 x daily - 7 x weekly - 2 sets - 10 reps - Standing Hip Extension with Resistance at Ankles and Counter Support  - 1 x daily - 7 x weekly - 2 sets - 10 reps - Standing March with Unilateral Counter Support  - 1 x daily - 7 x weekly - 2 sets - 10 reps - tandem stance balance; try not to hold on  - 1 x daily - 7 x weekly - 1 sets - 5 reps - 15 sec hold Access Code: [email or web address] URL: [email or web address] Date: 04/21/2024 Prepared by: Lang Ada  Exercises - Supine Bridge   - 1 x daily - 7 x weekly - 3 sets - 10 reps - Sit to Stand Without Arm Support  - 1 x daily - 7 x weekly - 3 sets - 10 reps - Clamshell  - 1 x daily - 7 x weekly - 3 sets - 10 reps  ASSESSMENT:  CLINICAL IMPRESSION: Patient continues to demonstrate decreased LE strength, specifically left abductors of hip, decreased gait quality and balance. Patient continues with walk with shuffling type gait; forward flexed trunk and needs continued cues to improve posture and to increase step length.   Limited motion available for hip extension; tends to flex the knee versus extend  the hip.  Patient often with uncontrolled descent with stand to sit.  Needs multiple cues with step ups for technique.   Patient would continue to benefit from skilled physical therapy for increased endurance with ambulation, increased LLE strength, and improved balance for improved quality of life, improved independence with gait training and continued progress towards therapy goals.   Began session reviewing bed mobility with min cueing to improve mechanics, able to complete with no physical assitance, just verbal cueing.  Session focus on gluteal strengthening to assist with functional strengthening and balance.  Added activtities to improve weight distrubition with gait and balance.  Pt continues to have difficulty with Lt SLS, added sidestepping with resistance and squats to HEP.   Evaluation: Patient is a 83 y.o. female who was seen today for physical therapy evaluation and treatment for S72.002A (ICD-10-CM) - Closed displaced fracture of left femoral neck (HCC).  Patient demonstrates decreased LLE strength, abnormal pain in left hip with prolonged standing and walking, and impaired balance. Patient also demonstrates difficulty with ambulation during today's session with decreased left stride length, stance time, and velocity noted. Patient also demonstrates decreased quality of movement with bed mobility as it is painful rolling  onto left side. Patient requires verbal and tactile cues for HEP prescription and proper form. Patient would benefit from skilled physical therapy for decreased pain and increased endurance with ambulation, increased LE strength, and balance for improved gait quality, return to higher level of function with ADLs, and progress towards therapy goals.   OBJECTIVE IMPAIRMENTS: Abnormal gait, decreased activity tolerance, decreased balance, decreased endurance, decreased mobility, difficulty walking, decreased strength, and pain.   ACTIVITY LIMITATIONS: carrying, lifting, bending, standing, squatting, stairs, transfers, bed mobility, and locomotion level  PARTICIPATION LIMITATIONS: meal prep, cleaning, laundry, driving, shopping, community activity, and yard work  PERSONAL FACTORS: Age, Fitness, Past/current experiences, and 1 comorbidity: recent fall and hip fx are also affecting patient's functional outcome.   REHAB POTENTIAL: Good  CLINICAL DECISION MAKING: Stable/uncomplicated  EVALUATION COMPLEXITY: Low   GOALS: Goals reviewed with patient? Yes  SHORT TERM GOALS: Target date: 05/12/24  Patient will demonstrate evidence of independence with individualized HEP and will report compliance for at least 3 days per week for optimized progression towards remaining therapy goals. Baseline:  Goal status: IN PROGRESS  2.  Patient will report a decrease in pain level during community ambulation by at least 2 points for improved quality of life. Baseline: 2/10 Goal status: MET     LONG TERM GOALS: Target date: 06/02/24  Pt will demonstrate a an increase of at least 9 points on the LEFS for improved performance of community ambulation and ADL. Baseline: see above Goal status: IN PROGRESS  2.  Pt will improve 2 MWT by 140  in order to demonstrate improved functional ambulatory capacity in community setting.  Baseline: see above; improved by 53 ft Goal status: IN PROGRESS  3.  Pt will  demonstrate WFL pain free ROM in left hip, for increased mobility and maximal efficiency of gait cycle during ambulation. Baseline: see objective Goal status: IN PROGRESS  4.  Pt will demonstrate at least 4/5 MMT for left lower extremity for increased strength during ADL and community ambulation. Baseline: see objective Goal status: IN PROGRESS  5.  Pt will improve 5TSTS by at least 2.3 seconds in order to improve functional strength during functional transfers. Baseline: see above Goal status: IN PROGRESS    PLAN:  PT FREQUENCY: 1-2x/week  PT DURATION: 6 weeks  PLANNED INTERVENTIONS: 97110-Therapeutic exercises, 97530- Therapeutic activity, [id number]- Neuromuscular re-education, 97535- Self Care, [id number]- Manual therapy, [phone]- Gait training, Patient/Family education, Balance training, Stair training, Joint mobilization, DME instructions, Cryotherapy, and Moist heat  PLAN FOR NEXT SESSION: Progress functional strengthening and balance. Continue 2 x a week for 4 more weeks to address remaining unmet and partially met goals  Augustin Mclean, LPTA/CLT; CBIS [phone]  4:28 PM, 06/10/24

## 2024-06-17 ENCOUNTER — Ambulatory Visit (HOSPITAL_COMMUNITY)

## 2024-06-17 DIAGNOSIS — M25552 Pain in left hip: Secondary | ICD-10-CM

## 2024-06-17 DIAGNOSIS — R29898 Other symptoms and signs involving the musculoskeletal system: Secondary | ICD-10-CM

## 2024-06-17 DIAGNOSIS — Z7409 Other reduced mobility: Secondary | ICD-10-CM

## 2024-06-17 NOTE — Therapy (Signed)
 OUTPATIENT PHYSICAL THERAPY TREATMENT  Patient Name: SEMIYAH NEWGENT MRN: [id number] DOB:10-Jun-1941, 83 y.o., female Today's Date: 06/17/2024  END OF SESSION:  PT End of Session - 06/17/24 0849     Visit Number 15    Number of Visits 20    Date for PT Re-Evaluation 06/25/24    Authorization Type [employer] MEDICARE HMO/PPO    Progress Note Due on Visit 10    PT Start Time 0848    PT Stop Time 0928    PT Time Calculation (min) 40 min    Activity Tolerance Patient tolerated treatment well    Behavior During Therapy [facility] for tasks assessed/performed             Past Medical History:  Diagnosis Date   Colon polyps    Hypertension    Seasonal allergies    Past Surgical History:  Procedure Laterality Date   APPENDECTOMY     CATARACT EXTRACTION W/PHACO Right 09/13/2013   Procedure: CATARACT EXTRACTION PHACO AND INTRAOCULAR LENS PLACEMENT (IOC);  Surgeon: Dow JULIANNA Burke, MD;  Location: [facility];  Service: Ophthalmology;  Laterality: Right;  CDE:3.25   CATARACT EXTRACTION W/PHACO Left 11/22/2013   Procedure: CATARACT EXTRACTION PHACO AND INTRAOCULAR LENS PLACEMENT (IOC);  Surgeon: Dow JULIANNA Burke, MD;  Location: [facility];  Service: Ophthalmology;  Laterality: Left;  CDE: 3.95   CESAREAN SECTION     x2   COLONOSCOPY     SLF 2010: sigmoid diverticula, otherwise wnl. Repeat 5 yrs.   HIP ARTHROPLASTY Left 02/25/2024   Procedure: HEMIARTHROPLASTY (BIPOLAR) HIP;  Surgeon: Margrette Taft BRAVO, MD;  Location: [facility];  Service: Orthopedics;  Laterality: Left;   I & D EXTREMITY Left    lateral wrist   LAPAROTOMY     oopherectomy   ORIF HUMERUS FRACTURE Left    Patient Active Problem List   Diagnosis Date Noted   Neurocognitive deficits 03/02/2024   Gastroesophageal reflux disease 02/27/2024   Closed displaced fracture of left femoral neck (HCC) 02/24/2024   Chronic kidney disease (CKD) stage G3a/A1, moderately decreased glomerular filtration rate (GFR) between 45-59 mL/min/1.73 square  meter and albuminuria creatinine ratio less than 30 mg/g (HCC) 05/23/2023   Left renal atrophy 05/08/2022   Enlarged thyroid  11/07/2021   Fatigue 04/23/2021   Irregular heartbeat 04/23/2021   Mixed hyperlipidemia 04/23/2021   Vitamin D  insufficiency 04/23/2021   Borderline prolonged QT interval 02/02/2020   Polycythemia 02/01/2020   Essential hypertension    History of colonic polyps 04/24/2015    PCP: Suanne Pfeiffer, NP   REFERRING PROVIDER: Margrette Taft BRAVO, MD  REFERRING DIAG: [phone] (ICD-10-CM) - Closed displaced fracture of left femoral neck (HCC)  THERAPY DIAG:  Pain in left hip  Weakness of left lower extremity  Impaired functional mobility, balance, gait, and endurance  Rationale for Evaluation and Treatment: Rehabilitation  ONSET DATE: Early this year, does not remember date  SUBJECTIVE:   SUBJECTIVE STATEMENT: No pain in the hip today.  Her son expresses concern that she has been trying to get up and walk on her own without the walker.    Evaluation: Pt states getting up early in the morning got 3/4 to the bathroom and all of a sudden left leg collapsed and fell on left side, felt immediate pain. Pt had surgery for hip fx and then went to a rehab for 2 weeks. Pt then had a couple of weeks of home PT after that. Pt states that she is not in pain and states the  hip does not hurt much anymore just feels weak on that side. Before the fall she was not using a walker and was driving.  PERTINENT HISTORY: Hip Fx PAIN:  Are you having pain? No and Yes: NPRS scale: 2/10 Pain location: Lt hip Pain description: Sharp Aggravating factors: Positioning Relieving factors: Rearrange positioning  PRECAUTIONS: Fall  RED FLAGS: None   WEIGHT BEARING RESTRICTIONS: No  FALLS:  Has patient fallen in last 6 months? Yes. Number of falls 1  LIVING ENVIRONMENT: Lives with: lives with their family and lives with their son Lives in: Mobile home Stairs: ramped  entrance Has following equipment at home: Single point cane, Environmental consultant - 2 wheeled, shower chair, and Ramped entry  OCCUPATION: retired  PLOF: Independent  PATIENT GOALS: To get back where she was; walk without the walker, get stronger left lower extremity. Pt would like to return to yard work and driving as well.  NEXT MD VISIT: maybe next Friday   OBJECTIVE:  Note: Objective measures were completed at Evaluation unless otherwise noted.  DIAGNOSTIC FINDINGS: CLINICAL DATA:  Fall and left hip pain.   EXAM: DG HIP (WITH OR WITHOUT PELVIS) 2-3V LEFT   COMPARISON:  Pelvic radiograph dated 02/01/2020.   FINDINGS: There is a mildly displaced and impacted fracture of the left femoral neck. No dislocation. The bones are osteopenic. The soft tissues are unremarkable.   IMPRESSION: Mildly displaced and impacted fracture of the left femoral neck.  PATIENT SURVEYS:  LEFS 39/80  COGNITION: Overall cognitive status: Within functional limits for tasks assessed     SENSATION: WFL  EDEMA:  None observed, none reported    PALPATION: Pt reports no tenderness of left hip, assess OP next session  LOWER EXTREMITY ROM:  Active ROM Right eval Left eval  Hip flexion 102 100  Hip extension 5/20: 5 5/20: 0  Hip abduction    Hip adduction    Hip internal rotation    Hip external rotation    Knee flexion    Knee extension    Ankle dorsiflexion    Ankle plantarflexion    Ankle inversion    Ankle eversion     (Blank rows = not tested)  LOWER EXTREMITY MMT:  MMT Right eval Left eval Left 05/24/24  Hip flexion 4 3+ 4  Hip extension 4 3+ 2  Hip abduction 4 3+ 2  Hip adduction 4 3+   Hip internal rotation     Hip external rotation     Knee flexion     Knee extension   4  Ankle dorsiflexion (limited range bilat)   Limited range 4  Ankle plantarflexion     Ankle inversion     Ankle eversion      (Blank rows = not tested)   FUNCTIONAL TESTS:  5 times sit to stand:  16.05 2 minute walk test: 182 feet  04/27/24:  Functional Gait Assessment Summary completed with RW 1. GAIT LEVEL SURFACE: Moderate impairment -- gait level surface (1) (1 points) 2. CHANGE IN GAIT SPEED: Moderate impairment -- change in gait speed (1) (1 points) 3. GAIT WITH HORIZONTAL HEAD TURNS: Moderate impairment -- gait with horizontal head turns (1) (1 points) 4. GAIT WITH VERTICAL HEAD TURNS: Moderate impairment -- gait with vertical head turns (1) (1 points) 5. GAIT AND PIVOT TURN: Moderate impairment -- gait and pivot turn (1) (1 points) 6. STEP OVER OBSTACLE: Moderate impairment -- step over obstacle (1) (1 points) 7. GAIT WITH NARROW BASE OF SUPPORT: Moderate  impairment -- gait with narrow base of support (1) (1 points) 8. GAIT WITH EYES CLOSED: Severe impairment -- gait with eyes closed (0) (0 points) 9. AMBULATING BACKWARDS: Moderate impairment -- ambulating backwards (1) (1 points) 10. STEPS: Mild impairment -- up and down steps (2) (2 points) Functional Gait Assessment: 10/30=33.3 percent. (Normal for age >20/30)   GAIT: Distance walked: 182 feet Assistive device utilized: Walker - 2 wheeled Level of assistance: Modified independence Comments: Pt demonstrates decreased stride length and stance time on left side.                                                                                                                                TREATMENT DATE:  06/17/24 Ambulation with SPC and CGA x 2 laps in gym with 1 sitting rest between laps // bars Heel raises on incline with 1 UE assist 2 x 10 Toe raises on decline with 1 UE assist 2 x 10 Seated LAQ's 3# 2 x 10 each Standing: 3# hip abduction 2 x 10 each 3# hip extension 2 x 10 each Sit to stand with green theraband around knees 2 x 10 no UE assist with CGA. 4 box toe taps x 10 with 1 UE assist when tapping right leg      06/10/24: Bed mobility:  Rolling Standing inside // bars:  -Forward/retro gait with 1  HHA  -Rockerboard Rt/Lt then Df/Pf 2x min each  -Sidestep with RTB 5RT with UE A  -Squat front of chair 10x 3  - Toe tapping alternating 6in step 15x each  - tandem stance with 1 foot on 6in step 2x 30 Nustep x 5' UE/LE  06/08/24 Treadmill x 5 min 0.6 mph to 0.8 mph Sit to stand with left leg on 2 step x 10 no UE assist occassional min A to come fully to stand 4 box step ups 2 x 10 4 lateral step ups 2 x 10 3# hip abduction and extension 2 x 10 each Tandem stance x 30 each no UE assist Nustep seat 9 x 5' level 3 to end treatment   06/04/2024  Therapeutic Exercise: -Treadmill, 5 minutes, level 2.0 grade, 0.8 speed, pt cued for upright posture and big steps -Leg press, 3 sets of 10 reps, plate 3, 4 and 5, pt cued for eccentric control -2 bouts of 15 feet ambulation with no AD, therapist CGA to leg press and back, pt demonstrates trendelenburg gait pattern  Neuromuscular Reeducation: -Aeromat walks, lateral stepping and tandem walks, 2 laps in parallel bars each variation  -Speed step ups, 2 sets of 10 reps bilaterally in parallel bars, 8 inch step, one UE utilized, 30 second bouts -Left TKE on 4 inch step, pt cued for level hips throughout movement for increased activation of left glue med   06/01/2024  Therapeutic Exercise: -Supine bridges 1 sets of 10 reps, 3 second holds, symptomatic, pt cued for max hip extension -Walking Marches/butt kicks  with 3 lb cuffs, 2 laps of each variation, 20 foot laps, SPC and CGA for saftey -Lateral stepping 2 laps 20 feet per lap, with 3 lb cuff around ankles, pt cued for upright posture -Ambulation with SPC and gait belt x 1 lap PT gym ~220 ft   Therapeutic Activity: -Sit to stands, 2 sets of 6 reps, with red trampoline toss, 3 tosses each rep -Step up and overs, 2 sets of 8 reps bilaterally in parallel bars, 6 inch step -Lateral step up and overs, 2 sets of 7 reps bilaterally, in parallel bars, BUE support required, 6 inch  step   05/27/24 Progress note LEFS 35/80 43.8% 5 times sit to stand 23.46 sec needing multiple attempts on initial stand MMT's and 2 MWT done last session Goal review Standing heel raises on incline 2 x 10 Toe raises 2 x 10 Hip abduction 2 x 10 each with 1 UE assist Hip extension with 1 UE assist 2 x 10 Tandem stance 30 x 3 each Squat to chair for target 2 x 10    PATIENT EDUCATION:  Education details: Pt was educated on findings of PT evaluation, prognosis, frequency of therapy visits and rationale, attendance policy, and HEP if given.   Person educated: Patient Education method: Explanation, Verbal cues, and Handouts Education comprehension: verbalized understanding, verbal cues required, tactile cues required, and needs further education  HOME EXERCISE PROGRAM: Access Code: [email or web address] URL: [email or web address] Date: 05/18/2024 Prepared by: [facility]  06/10/24: - Side Stepping with Resistance at Thighs and Counter Support  - 1-2 x daily - 7 x weekly - 2 sets - 10 reps - Squat with Chair and Counter Support  - 1 x daily - 7 x weekly - 3 sets - 10 reps  Exercises - Standing Hip Abduction with Resistance at Ankles and Counter Support  - 1 x daily - 7 x weekly - 2 sets - 10 reps - Standing Hip Extension with Resistance at Ankles and Counter Support  - 1 x daily - 7 x weekly - 2 sets - 10 reps - Standing March with Unilateral Counter Support  - 1 x daily - 7 x weekly - 2 sets - 10 reps - tandem stance balance; try not to hold on  - 1 x daily - 7 x weekly - 1 sets - 5 reps - 15 sec hold Access Code: [email or web address] URL: [email or web address] Date: 05/18/2024 Prepared by: [facility]  Exercises - Supine Bridge  - 1 x daily - 7 x weekly - 3 sets - 10 reps - Sit to Stand Without Arm Support  - 1 x daily - 7 x weekly - 3 sets - 10 reps - Clamshell  - 1 x daily - 7 x weekly - 3 sets - 10 reps - Standing Hip Abduction with Resistance at Ankles and Counter  Support  - 1 x daily - 7 x weekly - 2 sets - 10 reps - Standing Hip Extension with Resistance at Ankles and Counter Support  - 1 x daily - 7 x weekly - 2 sets - 10 reps - Standing March with Unilateral Counter Support  - 1 x daily - 7 x weekly - 2 sets - 10 reps - tandem stance balance; try not to hold on  - 1 x daily - 7 x weekly - 1 sets - 5 reps - 15 sec hold Access Code: [email or web address] URL: [email or web address] Date: 04/21/2024 Prepared by: Lang Ada  Exercises - Supine Bridge  - 1 x  daily - 7 x weekly - 3 sets - 10 reps - Sit to Stand Without Arm Support  - 1 x daily - 7 x weekly - 3 sets - 10 reps - Clamshell  - 1 x daily - 7 x weekly - 3 sets - 10 reps  ASSESSMENT:  CLINICAL IMPRESSION: Encouraged patient to practice walking with cane at home with her sons assist only; not alone as she is still at risk for falls.  Able to walk in gym with [facility] and CGA today; slight limp continues that is more noticeable as she fatigues. Doesn't report pain but just feels weak. Continues to need cues with standing hip exercises to avoid trunk substitution and avoid using momentum versus control.  Patient continues with uncontrolled descent back to the chair about 80% of the time despite frequent cues.  Unable to perform toe taps without holding with 1 UE assist with right foot tap due to left hip weakness.    Patient would continue to benefit from skilled physical therapy for increased endurance with ambulation, increased LLE strength, and improved balance for improved quality of life, improved independence with gait training and continued progress towards therapy goals.   Began session reviewing bed mobility with min cueing to improve mechanics, able to complete with no physical assitance, just verbal cueing.  Session focus on gluteal strengthening to assist with functional strengthening and balance.  Added activtities to improve weight distrubition with gait and balance.  Pt continues to have  difficulty with Lt SLS, added sidestepping with resistance and squats to HEP.   Evaluation: Patient is a 83 y.o. female who was seen today for physical therapy evaluation and treatment for S72.002A (ICD-10-CM) - Closed displaced fracture of left femoral neck (HCC).  Patient demonstrates decreased LLE strength, abnormal pain in left hip with prolonged standing and walking, and impaired balance. Patient also demonstrates difficulty with ambulation during today's session with decreased left stride length, stance time, and velocity noted. Patient also demonstrates decreased quality of movement with bed mobility as it is painful rolling onto left side. Patient requires verbal and tactile cues for HEP prescription and proper form. Patient would benefit from skilled physical therapy for decreased pain and increased endurance with ambulation, increased LE strength, and balance for improved gait quality, return to higher level of function with ADLs, and progress towards therapy goals.   OBJECTIVE IMPAIRMENTS: Abnormal gait, decreased activity tolerance, decreased balance, decreased endurance, decreased mobility, difficulty walking, decreased strength, and pain.   ACTIVITY LIMITATIONS: carrying, lifting, bending, standing, squatting, stairs, transfers, bed mobility, and locomotion level  PARTICIPATION LIMITATIONS: meal prep, cleaning, laundry, driving, shopping, community activity, and yard work  PERSONAL FACTORS: Age, Fitness, Past/current experiences, and 1 comorbidity: recent fall and hip fx are also affecting patient's functional outcome.   REHAB POTENTIAL: Good  CLINICAL DECISION MAKING: Stable/uncomplicated  EVALUATION COMPLEXITY: Low   GOALS: Goals reviewed with patient? Yes  SHORT TERM GOALS: Target date: 05/12/24  Patient will demonstrate evidence of independence with individualized HEP and will report compliance for at least 3 days per week for optimized progression towards remaining  therapy goals. Baseline:  Goal status: IN PROGRESS  2.  Patient will report a decrease in pain level during community ambulation by at least 2 points for improved quality of life. Baseline: 2/10 Goal status: MET     LONG TERM GOALS: Target date: 06/02/24  Pt will demonstrate a an increase of at least 9 points on the LEFS for improved performance of community ambulation  and ADL. Baseline: see above Goal status: IN PROGRESS  2.  Pt will improve 2 MWT by 140  in order to demonstrate improved functional ambulatory capacity in community setting.  Baseline: see above; improved by 53 ft Goal status: IN PROGRESS  3.  Pt will demonstrate WFL pain free ROM in left hip, for increased mobility and maximal efficiency of gait cycle during ambulation. Baseline: see objective Goal status: IN PROGRESS  4.  Pt will demonstrate at least 4/5 MMT for left lower extremity for increased strength during ADL and community ambulation. Baseline: see objective Goal status: IN PROGRESS  5.  Pt will improve 5TSTS by at least 2.3 seconds in order to improve functional strength during functional transfers. Baseline: see above Goal status: IN PROGRESS    PLAN:  PT FREQUENCY: 1-2x/week  PT DURATION: 6 weeks  PLANNED INTERVENTIONS: 97110-Therapeutic exercises, 97530- Therapeutic activity, 97112- Neuromuscular re-education, 97535- Self Care, [id number]- Manual therapy, [phone]- Gait training, Patient/Family education, Balance training, Stair training, Joint mobilization, DME instructions, Cryotherapy, and Moist heat  PLAN FOR NEXT SESSION: reassess next visit; patient's bday is [birth year].  Continue with balance and gait training and hip strengthening.  Likely will need an extension as she is progressing but slowly and continues to demonstrate risk for falls    8:56 AM, 06/17/24 Chamya Hunton Small Maysun Meditz MPT [place] [facility] [place] [phone]

## 2024-06-23 ENCOUNTER — Encounter (HOSPITAL_COMMUNITY)

## 2024-06-24 ENCOUNTER — Encounter (HOSPITAL_COMMUNITY): Payer: Self-pay

## 2024-06-24 ENCOUNTER — Ambulatory Visit (HOSPITAL_COMMUNITY)

## 2024-06-24 DIAGNOSIS — Z7409 Other reduced mobility: Secondary | ICD-10-CM | POA: Diagnosis not present

## 2024-06-24 DIAGNOSIS — M25552 Pain in left hip: Secondary | ICD-10-CM | POA: Diagnosis not present

## 2024-06-24 DIAGNOSIS — R29898 Other symptoms and signs involving the musculoskeletal system: Secondary | ICD-10-CM

## 2024-06-24 NOTE — Therapy (Signed)
 OUTPATIENT PHYSICAL THERAPY TREATMENT  Patient Name: Tiffany Everett MRN: [id number] DOB:[birth year], 83 y.o., female Today's Date: 06/24/2024  END OF SESSION:  PT End of Session - 06/24/24 1147     Visit Number 16    Number of Visits 20    Date for PT Re-Evaluation 06/25/24    Authorization Type [employer] MEDICARE HMO/PPO    Progress Note Due on Visit 10    PT Start Time 1147    PT Stop Time 1231    PT Time Calculation (min) 44 min    Equipment Utilized During Treatment Gait belt    Activity Tolerance Patient tolerated treatment well    Behavior During Therapy WFL for tasks assessed/performed             Past Medical History:  Diagnosis Date   Colon polyps    Hypertension    Seasonal allergies    Past Surgical History:  Procedure Laterality Date   APPENDECTOMY     CATARACT EXTRACTION W/PHACO Right 09/13/2013   Procedure: CATARACT EXTRACTION PHACO AND INTRAOCULAR LENS PLACEMENT (IOC);  Surgeon: Dow JULIANNA Burke, MD;  Location: [facility];  Service: Ophthalmology;  Laterality: Right;  CDE:3.25   CATARACT EXTRACTION W/PHACO Left 11/22/2013   Procedure: CATARACT EXTRACTION PHACO AND INTRAOCULAR LENS PLACEMENT (IOC);  Surgeon: Dow JULIANNA Burke, MD;  Location: [facility];  Service: Ophthalmology;  Laterality: Left;  CDE: 3.95   CESAREAN SECTION     x2   COLONOSCOPY     SLF 2010: sigmoid diverticula, otherwise wnl. Repeat 5 yrs.   HIP ARTHROPLASTY Left 02/25/2024   Procedure: HEMIARTHROPLASTY (BIPOLAR) HIP;  Surgeon: Margrette Taft BRAVO, MD;  Location: [facility];  Service: Orthopedics;  Laterality: Left;   I & D EXTREMITY Left    lateral wrist   LAPAROTOMY     oopherectomy   ORIF HUMERUS FRACTURE Left    Patient Active Problem List   Diagnosis Date Noted   Neurocognitive deficits 03/02/2024   Gastroesophageal reflux disease 02/27/2024   Closed displaced fracture of left femoral neck (HCC) 02/24/2024   Chronic kidney disease (CKD) stage G3a/A1, moderately decreased glomerular  filtration rate (GFR) between 45-59 mL/min/1.73 square meter and albuminuria creatinine ratio less than 30 mg/g (HCC) 05/23/2023   Left renal atrophy 05/08/2022   Enlarged thyroid  11/07/2021   Fatigue 04/23/2021   Irregular heartbeat 04/23/2021   Mixed hyperlipidemia 04/23/2021   Vitamin D  insufficiency 04/23/2021   Borderline prolonged QT interval 02/02/2020   Polycythemia 02/01/2020   Essential hypertension    History of colonic polyps 04/24/2015    PCP: Suanne Pfeiffer, NP   REFERRING PROVIDER: Margrette Taft BRAVO, MD  REFERRING DIAG: [phone] (ICD-10-CM) - Closed displaced fracture of left femoral neck (HCC)  THERAPY DIAG:  Pain in left hip  Weakness of left lower extremity  Impaired functional mobility, balance, gait, and endurance  Rationale for Evaluation and Treatment: Rehabilitation  ONSET DATE: Early this year, does not remember date  SUBJECTIVE:   SUBJECTIVE STATEMENT: No reports of pain today.  Has been walking with cane at home today.  Today [birth year] her birthday.    Evaluation: Pt states getting up early in the morning got 3/4 to the bathroom and all of a sudden left leg collapsed and fell on left side, felt immediate pain. Pt had surgery for hip fx and then went to a rehab for 2 weeks. Pt then had a couple of weeks of home PT after that. Pt states that she is not in pain and states  the hip does not hurt much anymore just feels weak on that side. Before the fall she was not using a walker and was driving.  PERTINENT HISTORY: Hip Fx PAIN:  Are you having pain? No and Yes: NPRS scale: 2/10 Pain location: Lt hip Pain description: Sharp Aggravating factors: Positioning Relieving factors: Rearrange positioning  PRECAUTIONS: Fall  RED FLAGS: None   WEIGHT BEARING RESTRICTIONS: No  FALLS:  Has patient fallen in last 6 months? Yes. Number of falls 1  LIVING ENVIRONMENT: Lives with: lives with their family and lives with their son Lives in: Mobile  home Stairs: ramped entrance Has following equipment at home: Single point cane, Environmental consultant - 2 wheeled, shower chair, and Ramped entry  OCCUPATION: retired  PLOF: Independent  PATIENT GOALS: To get back where she was; walk without the walker, get stronger left lower extremity. Pt would like to return to yard work and driving as well.  NEXT MD VISIT: maybe next Friday   OBJECTIVE:  Note: Objective measures were completed at Evaluation unless otherwise noted.  DIAGNOSTIC FINDINGS: CLINICAL DATA:  Fall and left hip pain.   EXAM: DG HIP (WITH OR WITHOUT PELVIS) 2-3V LEFT   COMPARISON:  Pelvic radiograph dated 02/01/2020.   FINDINGS: There is a mildly displaced and impacted fracture of the left femoral neck. No dislocation. The bones are osteopenic. The soft tissues are unremarkable.   IMPRESSION: Mildly displaced and impacted fracture of the left femoral neck.  PATIENT SURVEYS:  LEFS 39/80  COGNITION: Overall cognitive status: Within functional limits for tasks assessed     SENSATION: WFL  EDEMA:  None observed, none reported    PALPATION: Pt reports no tenderness of left hip, assess OP next session  LOWER EXTREMITY ROM:  Active ROM Right eval Left eval  Hip flexion 102 100  Hip extension 5/20: 5 5/20: 0  Hip abduction    Hip adduction    Hip internal rotation    Hip external rotation    Knee flexion    Knee extension    Ankle dorsiflexion    Ankle plantarflexion    Ankle inversion    Ankle eversion     (Blank rows = not tested)  LOWER EXTREMITY MMT:  MMT Right eval Left eval Left 05/24/24  Hip flexion 4 3+ 4  Hip extension 4 3+ 2  Hip abduction 4 3+ 2  Hip adduction 4 3+   Hip internal rotation     Hip external rotation     Knee flexion     Knee extension   4  Ankle dorsiflexion (limited range bilat)   Limited range 4  Ankle plantarflexion     Ankle inversion     Ankle eversion      (Blank rows = not tested)   FUNCTIONAL TESTS:  5 times  sit to stand: 16.05 2 minute walk test: 182 feet  04/27/24:  Functional Gait Assessment Summary completed with RW 1. GAIT LEVEL SURFACE: Moderate impairment -- gait level surface (1) (1 points) 2. CHANGE IN GAIT SPEED: Moderate impairment -- change in gait speed (1) (1 points) 3. GAIT WITH HORIZONTAL HEAD TURNS: Moderate impairment -- gait with horizontal head turns (1) (1 points) 4. GAIT WITH VERTICAL HEAD TURNS: Moderate impairment -- gait with vertical head turns (1) (1 points) 5. GAIT AND PIVOT TURN: Moderate impairment -- gait and pivot turn (1) (1 points) 6. STEP OVER OBSTACLE: Moderate impairment -- step over obstacle (1) (1 points) 7. GAIT WITH NARROW BASE OF SUPPORT:  Moderate impairment -- gait with narrow base of support (1) (1 points) 8. GAIT WITH EYES CLOSED: Severe impairment -- gait with eyes closed (0) (0 points) 9. AMBULATING BACKWARDS: Moderate impairment -- ambulating backwards (1) (1 points) 10. STEPS: Mild impairment -- up and down steps (2) (2 points) Functional Gait Assessment: 10/30=33.3 percent. (Normal for age >20/30)   GAIT: Distance walked: 182 feet Assistive device utilized: Walker - 2 wheeled Level of assistance: Modified independence Comments: Pt demonstrates decreased stride length and stance time on left side.                                                                                                                                TREATMENT DATE:  06/24/24: Gait training iwht SPC x 222ft with appropriate sequence, cueing for posture Squat front of chair 10x 3 Toe tapping 6in step height (required HHA during Lt LE weight bearing, able to stand on Rt LE with no HHA) 6in hurdles forward and lateral (required HHA during Lt LE weight bearing) Tandem stance with Rt foot on 6in and Lt on floor 30 holds without HHA then UE flexoin 10x wihtout HHA Sidestep RTB around thigh 4RT inside // bars STS 10x eccentric control no HHA SLS required HHA  Heel raises  no HHA 15x 3 Toe raises on decline with 1 UE assist 2 x 10  06/17/24 Ambulation with SPC and CGA x 2 laps in gym with 1 sitting rest between laps // bars Heel raises on incline with 1 UE assist 2 x 10 Toe raises on decline with 1 UE assist 2 x 10 Seated LAQ's 3# 2 x 10 each Standing: 3# hip abduction 2 x 10 each 3# hip extension 2 x 10 each Sit to stand with green theraband around knees 2 x 10 no UE assist with CGA. 4 box toe taps x 10 with 1 UE assist when tapping right leg      06/10/24: Bed mobility:  Rolling Standing inside // bars:  -Forward/retro gait with 1 HHA  -Rockerboard Rt/Lt then Df/Pf 2x min each  -Sidestep with RTB 5RT with UE A  -Squat front of chair 10x 3  - Toe tapping alternating 6in step 15x each  - tandem stance with 1 foot on 6in step 2x 30 Nustep x 5' UE/LE  06/08/24 Treadmill x 5 min 0.6 mph to 0.8 mph Sit to stand with left leg on 2 step x 10 no UE assist occassional min A to come fully to stand 4 box step ups 2 x 10 4 lateral step ups 2 x 10 3# hip abduction and extension 2 x 10 each Tandem stance x 30 each no UE assist Nustep seat 9 x 5' level 3 to end treatment   06/04/2024  Therapeutic Exercise: -Treadmill, 5 minutes, level 2.0 grade, 0.8 speed, pt cued for upright posture and big steps -Leg press, 3 sets of 10 reps, plate 3, 4 and 5, pt  cued for eccentric control -2 bouts of 15 feet ambulation with no AD, therapist CGA to leg press and back, pt demonstrates trendelenburg gait pattern  Neuromuscular Reeducation: -Aeromat walks, lateral stepping and tandem walks, 2 laps in parallel bars each variation  -Speed step ups, 2 sets of 10 reps bilaterally in parallel bars, 8 inch step, one UE utilized, 30 second bouts -Left TKE on 4 inch step, pt cued for level hips throughout movement for increased activation of left glue med   06/01/2024  Therapeutic Exercise: -Supine bridges 1 sets of 10 reps, 3 second holds, symptomatic, pt cued for max  hip extension -Walking Marches/butt kicks with 3 lb cuffs, 2 laps of each variation, 20 foot laps, SPC and CGA for saftey -Lateral stepping 2 laps 20 feet per lap, with 3 lb cuff around ankles, pt cued for upright posture -Ambulation with SPC and gait belt x 1 lap PT gym ~220 ft   Therapeutic Activity: -Sit to stands, 2 sets of 6 reps, with red trampoline toss, 3 tosses each rep -Step up and overs, 2 sets of 8 reps bilaterally in parallel bars, 6 inch step -Lateral step up and overs, 2 sets of 7 reps bilaterally, in parallel bars, BUE support required, 6 inch step   05/27/24 Progress note LEFS 35/80 43.8% 5 times sit to stand 23.46 sec needing multiple attempts on initial stand MMT's and 2 MWT done last session Goal review Standing heel raises on incline 2 x 10 Toe raises 2 x 10 Hip abduction 2 x 10 each with 1 UE assist Hip extension with 1 UE assist 2 x 10 Tandem stance 30 x 3 each Squat to chair for target 2 x 10    PATIENT EDUCATION:  Education details: Pt was educated on findings of PT evaluation, prognosis, frequency of therapy visits and rationale, attendance policy, and HEP if given.   Person educated: Patient Education method: Explanation, Verbal cues, and Handouts Education comprehension: verbalized understanding, verbal cues required, tactile cues required, and needs further education  HOME EXERCISE PROGRAM: Access Code: [email or web address] URL: [email or web address] Date: 05/18/2024 Prepared by: [facility]  06/10/24: - Side Stepping with Resistance at Thighs and Counter Support  - 1-2 x daily - 7 x weekly - 2 sets - 10 reps - Squat with Chair and Counter Support  - 1 x daily - 7 x weekly - 3 sets - 10 reps  Exercises - Standing Hip Abduction with Resistance at Ankles and Counter Support  - 1 x daily - 7 x weekly - 2 sets - 10 reps - Standing Hip Extension with Resistance at Ankles and Counter Support  - 1 x daily - 7 x weekly - 2 sets - 10 reps - Standing  March with Unilateral Counter Support  - 1 x daily - 7 x weekly - 2 sets - 10 reps - tandem stance balance; try not to hold on  - 1 x daily - 7 x weekly - 1 sets - 5 reps - 15 sec hold Access Code: [email or web address] URL: [email or web address] Date: 05/18/2024 Prepared by: [facility]  Exercises - Supine Bridge  - 1 x daily - 7 x weekly - 3 sets - 10 reps - Sit to Stand Without Arm Support  - 1 x daily - 7 x weekly - 3 sets - 10 reps - Clamshell  - 1 x daily - 7 x weekly - 3 sets - 10 reps - Standing Hip Abduction with Resistance at Ankles and Counter Support  -  1 x daily - 7 x weekly - 2 sets - 10 reps - Standing Hip Extension with Resistance at Ankles and Counter Support  - 1 x daily - 7 x weekly - 2 sets - 10 reps - Standing March with Unilateral Counter Support  - 1 x daily - 7 x weekly - 2 sets - 10 reps - tandem stance balance; try not to hold on  - 1 x daily - 7 x weekly - 1 sets - 5 reps - 15 sec hold Access Code: [email or web address] URL: [email or web address] Date: 04/21/2024 Prepared by: Lang Ada  Exercises - Supine Bridge  - 1 x daily - 7 x weekly - 3 sets - 10 reps - Sit to Stand Without Arm Support  - 1 x daily - 7 x weekly - 3 sets - 10 reps - Clamshell  - 1 x daily - 7 x weekly - 3 sets - 10 reps  ASSESSMENT:  CLINICAL IMPRESSION: Pt continues to required UE support during Lt LE stance phase with gait and balance training.  Session focus with hip strengthening and balance training with increased focus on improved single leg stance.  Gait trianing complete with SPC, pt able to complete good sequence with minimal cueing required to improve posture, equalize stance phase and equal stride length to improve mechanics.  Pt to be reassessed next session.  Evaluation: Patient is a 83 y.o. female who was seen today for physical therapy evaluation and treatment for S72.002A (ICD-10-CM) - Closed displaced fracture of left femoral neck (HCC).  Patient demonstrates decreased  LLE strength, abnormal pain in left hip with prolonged standing and walking, and impaired balance. Patient also demonstrates difficulty with ambulation during today's session with decreased left stride length, stance time, and velocity noted. Patient also demonstrates decreased quality of movement with bed mobility as it is painful rolling onto left side. Patient requires verbal and tactile cues for HEP prescription and proper form. Patient would benefit from skilled physical therapy for decreased pain and increased endurance with ambulation, increased LE strength, and balance for improved gait quality, return to higher level of function with ADLs, and progress towards therapy goals.   OBJECTIVE IMPAIRMENTS: Abnormal gait, decreased activity tolerance, decreased balance, decreased endurance, decreased mobility, difficulty walking, decreased strength, and pain.   ACTIVITY LIMITATIONS: carrying, lifting, bending, standing, squatting, stairs, transfers, bed mobility, and locomotion level  PARTICIPATION LIMITATIONS: meal prep, cleaning, laundry, driving, shopping, community activity, and yard work  PERSONAL FACTORS: Age, Fitness, Past/current experiences, and 1 comorbidity: recent fall and hip fx are also affecting patient's functional outcome.   REHAB POTENTIAL: Good  CLINICAL DECISION MAKING: Stable/uncomplicated  EVALUATION COMPLEXITY: Low   GOALS: Goals reviewed with patient? Yes  SHORT TERM GOALS: Target date: 05/12/24  Patient will demonstrate evidence of independence with individualized HEP and will report compliance for at least 3 days per week for optimized progression towards remaining therapy goals. Baseline:  Goal status: IN PROGRESS  2.  Patient will report a decrease in pain level during community ambulation by at least 2 points for improved quality of life. Baseline: 2/10 Goal status: MET     LONG TERM GOALS: Target date: 06/02/24  Pt will demonstrate a an increase of at  least 9 points on the LEFS for improved performance of community ambulation and ADL. Baseline: see above Goal status: IN PROGRESS  2.  Pt will improve 2 MWT by 140  in order to demonstrate improved functional ambulatory capacity in community setting.  Baseline: see above;  improved by 53 ft Goal status: IN PROGRESS  3.  Pt will demonstrate WFL pain free ROM in left hip, for increased mobility and maximal efficiency of gait cycle during ambulation. Baseline: see objective Goal status: IN PROGRESS  4.  Pt will demonstrate at least 4/5 MMT for left lower extremity for increased strength during ADL and community ambulation. Baseline: see objective Goal status: IN PROGRESS  5.  Pt will improve 5TSTS by at least 2.3 seconds in order to improve functional strength during functional transfers. Baseline: see above Goal status: IN PROGRESS    PLAN:  PT FREQUENCY: 1-2x/week  PT DURATION: 6 weeks  PLANNED INTERVENTIONS: 97110-Therapeutic exercises, 97530- Therapeutic activity, [id number]- Neuromuscular re-education, 97535- Self Care, [id number]- Manual therapy, [phone]- Gait training, Patient/Family education, Balance training, Stair training, Joint mobilization, DME instructions, Cryotherapy, and Moist heat  PLAN FOR NEXT SESSION: reassess next visit;  Continue with balance and gait training and hip strengthening.  Likely will need an extension as she is progressing but slowly and continues to demonstrate risk for falls   Augustin Mclean, LPTA/CLT; CBIS [phone]  12:58 PM, 06/24/24

## 2024-06-25 ENCOUNTER — Ambulatory Visit (HOSPITAL_COMMUNITY)

## 2024-06-25 ENCOUNTER — Encounter (HOSPITAL_COMMUNITY): Payer: Self-pay

## 2024-06-25 DIAGNOSIS — R29898 Other symptoms and signs involving the musculoskeletal system: Secondary | ICD-10-CM

## 2024-06-25 DIAGNOSIS — Z7409 Other reduced mobility: Secondary | ICD-10-CM

## 2024-06-25 DIAGNOSIS — M25552 Pain in left hip: Secondary | ICD-10-CM | POA: Diagnosis not present

## 2024-06-25 NOTE — Therapy (Addendum)
 OUTPATIENT PHYSICAL THERAPY TREATMENT Progress Note Reporting Period 05/27/24 to 06/25/24  See note below for Objective Data and Assessment of Progress/Goals.     Patient Name: Tiffany Everett MRN: [id number] DOB:[birth year], 83 y.o., female Today's Date: 06/25/2024  END OF SESSION:  PT End of Session - 06/25/24 1018     Visit Number 17    Number of Visits 25    Date for PT Re-Evaluation 07/23/24    Authorization Type [employer] MEDICARE HMO/PPO    Progress Note Due on Visit 27   Progress note done visit 17   PT Start Time 1019    PT Stop Time 1102    PT Time Calculation (min) 43 min    Equipment Utilized During Treatment Gait belt    Activity Tolerance Patient tolerated treatment well    Behavior During Therapy WFL for tasks assessed/performed             Past Medical History:  Diagnosis Date   Colon polyps    Hypertension    Seasonal allergies    Past Surgical History:  Procedure Laterality Date   APPENDECTOMY     CATARACT EXTRACTION W/PHACO Right 09/13/2013   Procedure: CATARACT EXTRACTION PHACO AND INTRAOCULAR LENS PLACEMENT (IOC);  Surgeon: Dow JULIANNA Burke, MD;  Location: [facility];  Service: Ophthalmology;  Laterality: Right;  CDE:3.25   CATARACT EXTRACTION W/PHACO Left 11/22/2013   Procedure: CATARACT EXTRACTION PHACO AND INTRAOCULAR LENS PLACEMENT (IOC);  Surgeon: Dow JULIANNA Burke, MD;  Location: [facility];  Service: Ophthalmology;  Laterality: Left;  CDE: 3.95   CESAREAN SECTION     x2   COLONOSCOPY     SLF 2010: sigmoid diverticula, otherwise wnl. Repeat 5 yrs.   HIP ARTHROPLASTY Left 02/25/2024   Procedure: HEMIARTHROPLASTY (BIPOLAR) HIP;  Surgeon: Margrette Taft BRAVO, MD;  Location: [facility];  Service: Orthopedics;  Laterality: Left;   I & D EXTREMITY Left    lateral wrist   LAPAROTOMY     oopherectomy   ORIF HUMERUS FRACTURE Left    Patient Active Problem List   Diagnosis Date Noted   Neurocognitive deficits 03/02/2024   Gastroesophageal reflux disease  02/27/2024   Closed displaced fracture of left femoral neck (HCC) 02/24/2024   Chronic kidney disease (CKD) stage G3a/A1, moderately decreased glomerular filtration rate (GFR) between 45-59 mL/min/1.73 square meter and albuminuria creatinine ratio less than 30 mg/g (HCC) 05/23/2023   Left renal atrophy 05/08/2022   Enlarged thyroid  11/07/2021   Fatigue 04/23/2021   Irregular heartbeat 04/23/2021   Mixed hyperlipidemia 04/23/2021   Vitamin D  insufficiency 04/23/2021   Borderline prolonged QT interval 02/02/2020   Polycythemia 02/01/2020   Essential hypertension    History of colonic polyps 04/24/2015    PCP: Suanne Pfeiffer, NP   REFERRING PROVIDER: Margrette Taft BRAVO, MD  REFERRING DIAG: [phone] (ICD-10-CM) - Closed displaced fracture of left femoral neck (HCC)  THERAPY DIAG:  Pain in left hip  Weakness of left lower extremity  Impaired functional mobility, balance, gait, and endurance  Rationale for Evaluation and Treatment: Rehabilitation  ONSET DATE: Early this year, does not remember date  SUBJECTIVE:   SUBJECTIVE STATEMENT: Feeling good today, was a little sore this morning following yesterday's session.  Feels she has improved by 20-30% since beginning therapy.  Evaluation: Pt states getting up early in the morning got 3/4 to the bathroom and all of a sudden left leg collapsed and fell on left side, felt immediate pain. Pt had surgery for hip fx and then went  to a rehab for 2 weeks. Pt then had a couple of weeks of home PT after that. Pt states that she is not in pain and states the hip does not hurt much anymore just feels weak on that side. Before the fall she was not using a walker and was driving.  PERTINENT HISTORY: Hip Fx PAIN:  Are you having pain? No and Yes: NPRS scale: 2/10 Pain location: Lt hip Pain description: Sharp Aggravating factors: Positioning Relieving factors: Rearrange positioning  PRECAUTIONS: Fall  RED FLAGS: None   WEIGHT BEARING  RESTRICTIONS: No  FALLS:  Has patient fallen in last 6 months? Yes. Number of falls 1  LIVING ENVIRONMENT: Lives with: lives with their family and lives with their son Lives in: Mobile home Stairs: ramped entrance Has following equipment at home: Single point cane, Environmental consultant - 2 wheeled, shower chair, and Ramped entry  OCCUPATION: retired  PLOF: Independent  PATIENT GOALS: To get back where she was; walk without the walker, get stronger left lower extremity. Pt would like to return to yard work and driving as well.  NEXT MD VISIT: maybe next Friday   OBJECTIVE:  Note: Objective measures were completed at Evaluation unless otherwise noted.  DIAGNOSTIC FINDINGS: CLINICAL DATA:  Fall and left hip pain.   EXAM: DG HIP (WITH OR WITHOUT PELVIS) 2-3V LEFT   COMPARISON:  Pelvic radiograph dated 02/01/2020.   FINDINGS: There is a mildly displaced and impacted fracture of the left femoral neck. No dislocation. The bones are osteopenic. The soft tissues are unremarkable.   IMPRESSION: Mildly displaced and impacted fracture of the left femoral neck.  PATIENT SURVEYS:  LEFS 39/80 06/25/24:  Lower Extremity Functional Score: 34 / 80 = 42.5 %  COGNITION: Overall cognitive status: Within functional limits for tasks assessed     SENSATION: WFL  EDEMA:  None observed, none reported    PALPATION: Pt reports no tenderness of left hip, assess OP next session  LOWER EXTREMITY ROM:  Active ROM Right eval Left eval Left 7/18/235  Hip flexion 102 100 103  Hip extension 5/20: 5 5/20: 0 06/25/24: 3   Hip abduction     Hip adduction     Hip internal rotation     Hip external rotation     Knee flexion     Knee extension     Ankle dorsiflexion     Ankle plantarflexion     Ankle inversion     Ankle eversion      (Blank rows = not tested)  LOWER EXTREMITY MMT:  MMT Right eval Left eval Left 05/24/24 Left  06/25/25 Right 06/25/24  Hip flexion 4 3+ 4 4 4   Hip extension 4 3+  2 Prone: 2+ 3+  Hip abduction 4 3+ 2 Sidelying:  2+  3+  Hip adduction 4 3+     Hip internal rotation       Hip external rotation       Knee flexion    4 4  Knee extension   4 4 4+  Ankle dorsiflexion (limited range bilat)   Limited range 4    Ankle plantarflexion       Ankle inversion       Ankle eversion        (Blank rows = not tested)   FUNCTIONAL TESTS:  5 times sit to stand: 16.05 2 minute walk test: 182 feet  06/25/24: 284ft with SPC.  5STS  28.83; 22.48 no HHA  04/27/24:  Functional Gait  Assessment Summary completed with RW 1. GAIT LEVEL SURFACE: Moderate impairment -- gait level surface (1) (1 points) 2. CHANGE IN GAIT SPEED: Moderate impairment -- change in gait speed (1) (1 points) 3. GAIT WITH HORIZONTAL HEAD TURNS: Moderate impairment -- gait with horizontal head turns (1) (1 points) 4. GAIT WITH VERTICAL HEAD TURNS: Moderate impairment -- gait with vertical head turns (1) (1 points) 5. GAIT AND PIVOT TURN: Moderate impairment -- gait and pivot turn (1) (1 points) 6. STEP OVER OBSTACLE: Moderate impairment -- step over obstacle (1) (1 points) 7. GAIT WITH NARROW BASE OF SUPPORT: Moderate impairment -- gait with narrow base of support (1) (1 points) 8. GAIT WITH EYES CLOSED: Severe impairment -- gait with eyes closed (0) (0 points) 9. AMBULATING BACKWARDS: Moderate impairment -- ambulating backwards (1) (1 points) 10. STEPS: Mild impairment -- up and down steps (2) (2 points) Functional Gait Assessment: 10/30=33.3 percent. (Normal for age >20/30)   GAIT: Distance walked: 182 feet Assistive device utilized: Walker - 2 wheeled Level of assistance: Modified independence Comments: Pt demonstrates decreased stride length and stance time on left side.                                                                                                                                TREATMENT DATE:  06/25/24: 270 with SPC MMT ROM Lower Extremity Functional Score:  34 / 80 = 42.5 % 5STS complete twice with no HHA: 28.83; 22.48 no HHA  FUNCTIONAL GAIT ASSESSMENT  Date: 07/18 Score  GAIT LEVEL SURFACE Instructions: Walk at your normal speed from here to the next mark (6 m) [20 ft]. (2) Mild impairment - Walks 6 m (20 ft) in less than 7 seconds but greater than 5.5 seconds, uses assistive device, slower speed, mild gait deviations, or deviates 15.24 -25.4 cm (6 -10 in) outside of the 30.48-cm (12-in) walkway width.  2.   CHANGE IN GAIT SPEED Instructions: Begin walking at your normal pace (for 1.5 m [5 ft]). When I tell you "go," walk as fast as you can (for 1.5 m [5 ft]). When I tell you "slow," walk as slowly as you can (for 1.5 m [5 ft]. (1) Moderate impairment - Makes only minor adjustments to walking speed, or accomplishes a change in speed with significant gait deviations, deviates 25.4 -38.1 cm (10 -15 in) outside the 30.48-cm (12-in) walkway width, or changes speed but loses balance but is able to recover and continue walking.  3.    GAIT WITH HORIZONTAL HEAD TURNS Instructions: Walk from here to the next mark 6 m (20 ft) away. Begin walking at your normal pace. Keep walking straight; after 3 steps, turn your head to the right and keep walking straight while looking to the right. After 3 more steps, turn your head to the left and keep walking straight while looking left. Continue alternating looking right and left. (1) Moderate impairment - Performs head turns  with moderate change in gait velocity, slows down, deviates 25.4 -38.1 cm (10 -15 in) outside 30.48-cm (12-in) walkway width but recovers, can continue to walk.  4.   GAIT WITH VERTICAL HEAD TURNS Instructions: Walk from here to the next mark (6 m [20 ft]). Begin walking at your normal pace. Keep walking straight; after 3 steps, tip your head up and keep walking straight while looking up. After 3 more steps, tip your head down, keep walking straight while looking down. Continue  alternating looking up  and down every 3 steps until you have completed 2 repetitions in each direction. (1) Moderate impairment - Performs task with moderate change in gait velocity, slows down, deviates 25.4 -38.1 cm (10 -15 in) outside 30.48-cm (12-in) walkway width but recovers, can continue to walk.  5.  GAIT AND PIVOT TURN Instructions: Begin with walking at your normal pace. When I tell you, "turn and stop," turn as quickly as you can to face the opposite direction and stop. (1) Moderate impairment - Turns slowly, requires verbal cueing, or requires several small steps to catch balance following turn and stop  6.   STEP OVER OBSTACLE Instructions: Begin walking at your normal speed. When you come to the shoe box, step over it, not around it, and keep walking.   7.   GAIT WITH NARROW BASE OF SUPPORT Instructions: Walk on the floor with arms folded across the chest, feet aligned heel to toe in tandem for a distance of 3.6 m [12 ft]. The number of steps taken in a straight line are counted for a maximum of 10 steps.   8.   GAIT WITH EYES CLOSED Instructions: Walk at your normal speed from here to the next mark (6 m [20 ft]) with your eyes closed.   9.   AMBULATING BACKWARDS Instructions: Walk backwards until I tell you to stop   10. STEPS Instructions: Walk up these stairs as you would at home (ie, using the rail if necessary). At the top turn around and walk down. (1) Moderate impairment-Two feet to a stair; must use rail.  Total /30    06/24/24: Gait training iwht SPC x 254ft with appropriate sequence, cueing for posture Squat front of chair 10x 3 Toe tapping 6in step height (required HHA during Lt LE weight bearing, able to stand on Rt LE with no HHA) 6in hurdles forward and lateral (required HHA during Lt LE weight bearing) Tandem stance with Rt foot on 6in and Lt on floor 30 holds without HHA then UE flexoin 10x wihtout HHA Sidestep RTB around thigh 4RT inside // bars STS 10x eccentric control no HHA SLS  required HHA  Heel raises no HHA 15x 3 Toe raises on decline with 1 UE assist 2 x 10  06/17/24 Ambulation with SPC and CGA x 2 laps in gym with 1 sitting rest between laps // bars Heel raises on incline with 1 UE assist 2 x 10 Toe raises on decline with 1 UE assist 2 x 10 Seated LAQ's 3# 2 x 10 each Standing: 3# hip abduction 2 x 10 each 3# hip extension 2 x 10 each Sit to stand with green theraband around knees 2 x 10 no UE assist with CGA. 4 box toe taps x 10 with 1 UE assist when tapping right leg      06/10/24: Bed mobility:  Rolling Standing inside // bars:  -Forward/retro gait with 1 HHA  -Rockerboard Rt/Lt then Df/Pf 2x min each  -Sidestep with RTB  5RT with UE A  -Squat front of chair 10x 3  - Toe tapping alternating 6in step 15x each  - tandem stance with 1 foot on 6in step 2x 30 Nustep x 5' UE/LE  06/08/24 Treadmill x 5 min 0.6 mph to 0.8 mph Sit to stand with left leg on 2 step x 10 no UE assist occassional min A to come fully to stand 4 box step ups 2 x 10 4 lateral step ups 2 x 10 3# hip abduction and extension 2 x 10 each Tandem stance x 30 each no UE assist Nustep seat 9 x 5' level 3 to end treatment   06/04/2024  Therapeutic Exercise: -Treadmill, 5 minutes, level 2.0 grade, 0.8 speed, pt cued for upright posture and big steps -Leg press, 3 sets of 10 reps, plate 3, 4 and 5, pt cued for eccentric control -2 bouts of 15 feet ambulation with no AD, therapist CGA to leg press and back, pt demonstrates trendelenburg gait pattern  Neuromuscular Reeducation: -Aeromat walks, lateral stepping and tandem walks, 2 laps in parallel bars each variation  -Speed step ups, 2 sets of 10 reps bilaterally in parallel bars, 8 inch step, one UE utilized, 30 second bouts -Left TKE on 4 inch step, pt cued for level hips throughout movement for increased activation of left glue med   06/01/2024  Therapeutic Exercise: -Supine bridges 1 sets of 10 reps, 3 second holds,  symptomatic, pt cued for max hip extension -Walking Marches/butt kicks with 3 lb cuffs, 2 laps of each variation, 20 foot laps, SPC and CGA for saftey -Lateral stepping 2 laps 20 feet per lap, with 3 lb cuff around ankles, pt cued for upright posture -Ambulation with SPC and gait belt x 1 lap PT gym ~220 ft   Therapeutic Activity: -Sit to stands, 2 sets of 6 reps, with red trampoline toss, 3 tosses each rep -Step up and overs, 2 sets of 8 reps bilaterally in parallel bars, 6 inch step -Lateral step up and overs, 2 sets of 7 reps bilaterally, in parallel bars, BUE support required, 6 inch step   05/27/24 Progress note LEFS 35/80 43.8% 5 times sit to stand 23.46 sec needing multiple attempts on initial stand MMT's and 2 MWT done last session Goal review Standing heel raises on incline 2 x 10 Toe raises 2 x 10 Hip abduction 2 x 10 each with 1 UE assist Hip extension with 1 UE assist 2 x 10 Tandem stance 30 x 3 each Squat to chair for target 2 x 10    PATIENT EDUCATION:  Education details: Pt was educated on findings of PT evaluation, prognosis, frequency of therapy visits and rationale, attendance policy, and HEP if given.   Person educated: Patient Education method: Explanation, Verbal cues, and Handouts Education comprehension: verbalized understanding, verbal cues required, tactile cues required, and needs further education  HOME EXERCISE PROGRAM: Access Code: [email or web address] URL: [email or web address] Date: 05/18/2024 Prepared by: [facility]  06/10/24: - Side Stepping with Resistance at Thighs and Counter Support  - 1-2 x daily - 7 x weekly - 2 sets - 10 reps - Squat with Chair and Counter Support  - 1 x daily - 7 x weekly - 3 sets - 10 reps  Exercises - Standing Hip Abduction with Resistance at Ankles and Counter Support  - 1 x daily - 7 x weekly - 2 sets - 10 reps - Standing Hip Extension with Resistance at Ankles and Counter Support  - 1 x  daily - 7 x weekly - 2  sets - 10 reps - Standing March with Unilateral Counter Support  - 1 x daily - 7 x weekly - 2 sets - 10 reps - tandem stance balance; try not to hold on  - 1 x daily - 7 x weekly - 1 sets - 5 reps - 15 sec hold Access Code: [email or web address] URL: [email or web address] Date: 05/18/2024 Prepared by: [facility]  Exercises - Supine Bridge  - 1 x daily - 7 x weekly - 3 sets - 10 reps - Sit to Stand Without Arm Support  - 1 x daily - 7 x weekly - 3 sets - 10 reps - Clamshell  - 1 x daily - 7 x weekly - 3 sets - 10 reps - Standing Hip Abduction with Resistance at Ankles and Counter Support  - 1 x daily - 7 x weekly - 2 sets - 10 reps - Standing Hip Extension with Resistance at Ankles and Counter Support  - 1 x daily - 7 x weekly - 2 sets - 10 reps - Standing March with Unilateral Counter Support  - 1 x daily - 7 x weekly - 2 sets - 10 reps - tandem stance balance; try not to hold on  - 1 x daily - 7 x weekly - 1 sets - 5 reps - 15 sec hold Access Code: [email or web address] URL: [email or web address] Date: 04/21/2024 Prepared by: Lang Ada  Exercises - Supine Bridge  - 1 x daily - 7 x weekly - 3 sets - 10 reps - Sit to Stand Without Arm Support  - 1 x daily - 7 x weekly - 3 sets - 10 reps - Clamshell  - 1 x daily - 7 x weekly - 3 sets - 10 reps  ASSESSMENT:  CLINICAL IMPRESSION: Reviewed goals with the following findings:  Pt has met 2/5 STGs and progressing towards all LTGs.  Pt continue to presents with significant weakness in hip musculautre, decreased hip mobility and high risk of falls without AD.  Began functional gait asssessment though limited by time to complete, will need to complete next session.  Pt continued to required UE support during Lt LE stance phase during gait.  Will continue to benefit from skilled intervention to address goals unmet to reduce risk of fall injury.  Plan to continue 4 additional weeks.  Reviewed current exercises to complete at home, verbalized  understanding.    Evaluation: Patient is a 83 y.o. female who was seen today for physical therapy evaluation and treatment for S72.002A (ICD-10-CM) - Closed displaced fracture of left femoral neck (HCC).  Patient demonstrates decreased LLE strength, abnormal pain in left hip with prolonged standing and walking, and impaired balance. Patient also demonstrates difficulty with ambulation during today's session with decreased left stride length, stance time, and velocity noted. Patient also demonstrates decreased quality of movement with bed mobility as it is painful rolling onto left side. Patient requires verbal and tactile cues for HEP prescription and proper form. Patient would benefit from skilled physical therapy for decreased pain and increased endurance with ambulation, increased LE strength, and balance for improved gait quality, return to higher level of function with ADLs, and progress towards therapy goals.   OBJECTIVE IMPAIRMENTS: Abnormal gait, decreased activity tolerance, decreased balance, decreased endurance, decreased mobility, difficulty walking, decreased strength, and pain.   ACTIVITY LIMITATIONS: carrying, lifting, bending, standing, squatting, stairs, transfers, bed mobility, and locomotion level  PARTICIPATION LIMITATIONS: meal prep, cleaning, laundry, driving, shopping, community activity,  and yard work  PERSONAL FACTORS: Age, Fitness, Past/current experiences, and 1 comorbidity: recent fall and hip fx are also affecting patient's functional outcome.   REHAB POTENTIAL: Good  CLINICAL DECISION MAKING: Stable/uncomplicated  EVALUATION COMPLEXITY: Low   GOALS: Goals reviewed with patient? Yes  SHORT TERM GOALS: Target date: 05/12/24  Patient will demonstrate evidence of independence with individualized HEP and will report compliance for at least 3 days per week for optimized progression towards remaining therapy goals. Baseline: 06/25/24:  Reports compliance with HEP 5  days a week Goal status: MET  2.  Patient will report a decrease in pain level during community ambulation by at least 2 points for improved quality of life. Baseline: 2/10 Goal status: MET     LONG TERM GOALS: Target date: 06/02/24  Pt will demonstrate a an increase of at least 9 points on the LEFS for improved performance of community ambulation and ADL. Baseline: see above Goal status: IN PROGRESS  2.  Pt will improve 2 MWT by 140  in order to demonstrate improved functional ambulatory capacity in community setting.  Baseline: see above; improved by 53 ft; 06/25/24:  improved by 42ft Goal status: IN PROGRESS  3.  Pt will demonstrate WFL pain free ROM in left hip, for increased mobility and maximal efficiency of gait cycle during ambulation. Baseline: see objective Goal status: IN PROGRESS  4.  Pt will demonstrate at least 4/5 MMT for left lower extremity for increased strength during ADL and community ambulation. Baseline: see objective; see above Goal status: IN PROGRESS  5.  Pt will improve 5TSTS by at least 2.3 seconds in order to improve functional strength during functional transfers. Baseline: see above Goal status: IN PROGRESS    PLAN:  PT FREQUENCY: 1-2x/week  PT DURATION: 6 weeks  PLANNED INTERVENTIONS: 97110-Therapeutic exercises, 97530- Therapeutic activity, [id number]- Neuromuscular re-education, 97535- Self Care, [id number]- Manual therapy, [phone]- Gait training, Patient/Family education, Balance training, Stair training, Joint mobilization, DME instructions, Cryotherapy, and Moist heat  PLAN FOR NEXT SESSION: Finish FGA next session, limited by time.  Continue with balance and gait training and hip strengthening.    Augustin Mclean, LPTA/CLT; [facility] [phone]  5:35 PM, 06/25/24  Lang Ada, PT, DPT [facility] Office: [phone] 6:03 PM, 06/25/24

## 2024-06-25 NOTE — Addendum Note (Signed)
 Addended by: Thanh Pomerleau on: 06/25/2024 06:04 PM   Modules accepted: Orders

## 2024-06-29 ENCOUNTER — Ambulatory Visit (HOSPITAL_COMMUNITY): Admitting: Physical Therapy

## 2024-06-29 DIAGNOSIS — Z7409 Other reduced mobility: Secondary | ICD-10-CM | POA: Diagnosis not present

## 2024-06-29 DIAGNOSIS — R29898 Other symptoms and signs involving the musculoskeletal system: Secondary | ICD-10-CM | POA: Diagnosis not present

## 2024-06-29 DIAGNOSIS — M25552 Pain in left hip: Secondary | ICD-10-CM

## 2024-06-29 NOTE — Therapy (Signed)
 OUTPATIENT PHYSICAL THERAPY TREATMENT    Patient Name: Tiffany Everett MRN: [id number] DOB:09-15-1941, 83 y.o., female Today's Date: 07/01/2024  END OF SESSION:    PT End of Session - 06/29/24 1628       Visit Number 18     Number of Visits 25     Date for PT Re-Evaluation 07/23/24     Authorization Type [employer] MEDICARE HMO/PPO     Progress Note Due on Visit 27   Progress note done visit 17    PT Start Time 1520    PT Stop Time 1618    PT Time Calculation (min) 58 min     Equipment Utilized During Treatment Gait belt     Activity Tolerance Patient tolerated treatment well     Behavior During Therapy WFL for tasks assessed/performed                 Past Medical History:  Diagnosis Date   Colon polyps    Hypertension    Seasonal allergies    Past Surgical History:  Procedure Laterality Date   APPENDECTOMY     CATARACT EXTRACTION W/PHACO Right 09/13/2013   Procedure: CATARACT EXTRACTION PHACO AND INTRAOCULAR LENS PLACEMENT (IOC);  Surgeon: Dow JULIANNA Burke, MD;  Location: [facility];  Service: Ophthalmology;  Laterality: Right;  CDE:3.25   CATARACT EXTRACTION W/PHACO Left 11/22/2013   Procedure: CATARACT EXTRACTION PHACO AND INTRAOCULAR LENS PLACEMENT (IOC);  Surgeon: Dow JULIANNA Burke, MD;  Location: [facility];  Service: Ophthalmology;  Laterality: Left;  CDE: 3.95   CESAREAN SECTION     x2   COLONOSCOPY     SLF 2010: sigmoid diverticula, otherwise wnl. Repeat 5 yrs.   HIP ARTHROPLASTY Left 02/25/2024   Procedure: HEMIARTHROPLASTY (BIPOLAR) HIP;  Surgeon: Margrette Taft BRAVO, MD;  Location: [facility];  Service: Orthopedics;  Laterality: Left;   I & D EXTREMITY Left    lateral wrist   LAPAROTOMY     oopherectomy   ORIF HUMERUS FRACTURE Left    Patient Active Problem List   Diagnosis Date Noted   Neurocognitive deficits 03/02/2024   Gastroesophageal reflux disease 02/27/2024   Closed displaced fracture of left femoral neck (HCC) 02/24/2024   Chronic kidney disease (CKD)  stage G3a/A1, moderately decreased glomerular filtration rate (GFR) between 45-59 mL/min/1.73 square meter and albuminuria creatinine ratio less than 30 mg/g (HCC) 05/23/2023   Left renal atrophy 05/08/2022   Enlarged thyroid  11/07/2021   Fatigue 04/23/2021   Irregular heartbeat 04/23/2021   Mixed hyperlipidemia 04/23/2021   Vitamin D  insufficiency 04/23/2021   Borderline prolonged QT interval 02/02/2020   Polycythemia 02/01/2020   Essential hypertension    History of colonic polyps 04/24/2015    PCP: Suanne Pfeiffer, NP   REFERRING PROVIDER: Margrette Taft BRAVO, MD  REFERRING DIAG: [id number] (ICD-10-CM) - Closed displaced fracture of left femoral neck (HCC)  THERAPY DIAG:  Pain in left hip  Weakness of left lower extremity  Impaired functional mobility, balance, gait, and endurance  Rationale for Evaluation and Treatment: Rehabilitation  ONSET DATE: Early this year, does not remember date  SUBJECTIVE:   SUBJECTIVE STATEMENT: Feeling good today, was a little sore this morning following yesterday's session.  Feels she has improved by 20-30% since beginning therapy.  Evaluation: Pt states getting up early in the morning got 3/4 to the bathroom and all of a sudden left leg collapsed and fell on left side, felt immediate pain. Pt had surgery for hip fx and then went to a rehab  for 2 weeks. Pt then had a couple of weeks of home PT after that. Pt states that she is not in pain and states the hip does not hurt much anymore just feels weak on that side. Before the fall she was not using a walker and was driving.  PERTINENT HISTORY: Hip Fx PAIN:  Are you having pain? No and Yes: NPRS scale: 2/10 Pain location: Lt hip Pain description: Sharp Aggravating factors: Positioning Relieving factors: Rearrange positioning  PRECAUTIONS: Fall  RED FLAGS: None   WEIGHT BEARING RESTRICTIONS: No  FALLS:  Has patient fallen in last 6 months? Yes. Number of falls 1  LIVING  ENVIRONMENT: Lives with: lives with their family and lives with their son Lives in: Mobile home Stairs: ramped entrance Has following equipment at home: Single point cane, Environmental consultant - 2 wheeled, shower chair, and Ramped entry  OCCUPATION: retired  PLOF: Independent  PATIENT GOALS: To get back where she was; walk without the walker, get stronger left lower extremity. Pt would like to return to yard work and driving as well.  NEXT MD VISIT: maybe next Friday   OBJECTIVE:  Note: Objective measures were completed at Evaluation unless otherwise noted.  DIAGNOSTIC FINDINGS: CLINICAL DATA:  Fall and left hip pain.   EXAM: DG HIP (WITH OR WITHOUT PELVIS) 2-3V LEFT   COMPARISON:  Pelvic radiograph dated 02/01/2020.   FINDINGS: There is a mildly displaced and impacted fracture of the left femoral neck. No dislocation. The bones are osteopenic. The soft tissues are unremarkable.   IMPRESSION: Mildly displaced and impacted fracture of the left femoral neck.  PATIENT SURVEYS:  LEFS 39/80 06/25/24:  Lower Extremity Functional Score: 34 / 80 = 42.5 %  COGNITION: Overall cognitive status: Within functional limits for tasks assessed     SENSATION: WFL  EDEMA:  None observed, none reported    PALPATION: Pt reports no tenderness of left hip, assess OP next session  LOWER EXTREMITY ROM:  Active ROM Right eval Left eval Left 7/18/235  Hip flexion 102 100 103  Hip extension 5/20: 5 5/20: 0 06/25/24: 3   Hip abduction     Hip adduction     Hip internal rotation     Hip external rotation     Knee flexion     Knee extension     Ankle dorsiflexion     Ankle plantarflexion     Ankle inversion     Ankle eversion      (Blank rows = not tested)  LOWER EXTREMITY MMT:  MMT Right eval Left eval Left 05/24/24 Left  06/25/25 Right 06/25/24  Hip flexion 4 3+ 4 4 4   Hip extension 4 3+ 2 Prone: 2+ 3+  Hip abduction 4 3+ 2 Sidelying:  2+  3+  Hip adduction 4 3+     Hip internal  rotation       Hip external rotation       Knee flexion    4 4  Knee extension   4 4 4+  Ankle dorsiflexion (limited range bilat)   Limited range 4    Ankle plantarflexion       Ankle inversion       Ankle eversion        (Blank rows = not tested)   FUNCTIONAL TESTS:  5 times sit to stand: 16.05 2 minute walk test: 182 feet  06/25/24: 222ft with SPC.  5STS  28.83; 22.48 no HHA  04/27/24:  Functional Gait Assessment Summary completed  with RW 1. GAIT LEVEL SURFACE: Moderate impairment -- gait level surface (1) (1 points) 2. CHANGE IN GAIT SPEED: Moderate impairment -- change in gait speed (1) (1 points) 3. GAIT WITH HORIZONTAL HEAD TURNS: Moderate impairment -- gait with horizontal head turns (1) (1 points) 4. GAIT WITH VERTICAL HEAD TURNS: Moderate impairment -- gait with vertical head turns (1) (1 points) 5. GAIT AND PIVOT TURN: Moderate impairment -- gait and pivot turn (1) (1 points) 6. STEP OVER OBSTACLE: Moderate impairment -- step over obstacle (1) (1 points) 7. GAIT WITH NARROW BASE OF SUPPORT: Moderate impairment -- gait with narrow base of support (1) (1 points) 8. GAIT WITH EYES CLOSED: Severe impairment -- gait with eyes closed (0) (0 points) 9. AMBULATING BACKWARDS: Moderate impairment -- ambulating backwards (1) (1 points) 10. STEPS: Mild impairment -- up and down steps (2) (2 points) Functional Gait Assessment: 10/30=33.3 percent. (Normal for age >20/30)   GAIT: Distance walked: 182 feet Assistive device utilized: Walker - 2 wheeled Level of assistance: Modified independence Comments: Pt demonstrates decreased stride length and stance time on left side.                                                                                                                                TREATMENT DATE:  06/25/24: 270 with SPC MMT ROM Lower Extremity Functional Score: 34 / 80 = 42.5 % 5STS complete twice with no HHA: 28.83; 22.48 no HHA  FUNCTIONAL GAIT  ASSESSMENT  Date: 07/18 Score  GAIT LEVEL SURFACE Instructions: Walk at your normal speed from here to the next mark (6 m) [20 ft]. (2) Mild impairment - Walks 6 m (20 ft) in less than 7 seconds but greater than 5.5 seconds, uses assistive device, slower speed, mild gait deviations, or deviates 15.24 -25.4 cm (6 -10 in) outside of the 30.48-cm (12-in) walkway width.  2.   CHANGE IN GAIT SPEED Instructions: Begin walking at your normal pace (for 1.5 m [5 ft]). When I tell you "go," walk as fast as you can (for 1.5 m [5 ft]). When I tell you "slow," walk as slowly as you can (for 1.5 m [5 ft]. (1) Moderate impairment - Makes only minor adjustments to walking speed, or accomplishes a change in speed with significant gait deviations, deviates 25.4 -38.1 cm (10 -15 in) outside the 30.48-cm (12-in) walkway width, or changes speed but loses balance but is able to recover and continue walking.  3.    GAIT WITH HORIZONTAL HEAD TURNS Instructions: Walk from here to the next mark 6 m (20 ft) away. Begin walking at your normal pace. Keep walking straight; after 3 steps, turn your head to the right and keep walking straight while looking to the right. After 3 more steps, turn your head to the left and keep walking straight while looking left. Continue alternating looking right and left. (1) Moderate impairment - Performs head turns with moderate change  in gait velocity, slows down, deviates 25.4 -38.1 cm (10 -15 in) outside 30.48-cm (12-in) walkway width but recovers, can continue to walk.  4.   GAIT WITH VERTICAL HEAD TURNS Instructions: Walk from here to the next mark (6 m [20 ft]). Begin walking at your normal pace. Keep walking straight; after 3 steps, tip your head up and keep walking straight while looking up. After 3 more steps, tip your head down, keep walking straight while looking down. Continue  alternating looking up and down every 3 steps until you have completed 2 repetitions in each direction. (1)  Moderate impairment - Performs task with moderate change in gait velocity, slows down, deviates 25.4 -38.1 cm (10 -15 in) outside 30.48-cm (12-in) walkway width but recovers, can continue to walk.  5.  GAIT AND PIVOT TURN Instructions: Begin with walking at your normal pace. When I tell you, "turn and stop," turn as quickly as you can to face the opposite direction and stop. (1) Moderate impairment - Turns slowly, requires verbal cueing, or requires several small steps to catch balance following turn and stop  6.   STEP OVER OBSTACLE Instructions: Begin walking at your normal speed. When you come to the shoe box, step over it, not around it, and keep walking.  2  7.   GAIT WITH NARROW BASE OF SUPPORT Instructions: Walk on the floor with arms folded across the chest, feet aligned heel to toe in tandem for a distance of 3.6 m [12 ft]. The number of steps taken in a straight line are counted for a maximum of 10 steps. 1  8.   GAIT WITH EYES CLOSED Instructions: Walk at your normal speed from here to the next mark (6 m [20 ft]) with your eyes closed. 0  9.   AMBULATING BACKWARDS Instructions: Walk backwards until I tell you to stop 1  10. STEPS Instructions: Walk up these stairs as you would at home (ie, using the rail if necessary). At the top turn around and walk down. (1) Moderate impairment-Two feet to a stair; must use rail.  Total 12/30    06/29/24: Rocker board x 2 minutes.  Marching x 10  Heel raises x 10 Knee driver x 5 for 10 B Hamstring stretch x 5 for 10 B  Squat no UE assist x 10 Step up 7 step x 10 B SLS required HHA x 3  POWER up x 7 Power rock x 7 Power twist x 7  Power step x 7 Theraband side step with green band around mat x 2 Nustep level 1 at or above 60 STM x 5 minutes   PATIENT EDUCATION:  Education details: Pt was educated on findings of PT evaluation, prognosis, frequency of therapy visits and rationale, attendance policy, and HEP if given.   Person educated:  Patient Education method: Explanation, Verbal cues, and Handouts Education comprehension: verbalized understanding, verbal cues required, tactile cues required, and needs further education  HOME EXERCISE PROGRAM: Access Code: [email or web address] URL: [email or web address] Date: 05/18/2024 Prepared by: [facility]  06/10/24: - Side Stepping with Resistance at Thighs and Counter Support  - 1-2 x daily - 7 x weekly - 2 sets - 10 reps - Squat with Chair and Counter Support  - 1 x daily - 7 x weekly - 3 sets - 10 reps  Exercises - Standing Hip Abduction with Resistance at Ankles and Counter Support  - 1 x daily - 7 x weekly - 2 sets - 10 reps - Standing  Hip Extension with Resistance at Ankles and Counter Support  - 1 x daily - 7 x weekly - 2 sets - 10 reps - Standing March with Unilateral Counter Support  - 1 x daily - 7 x weekly - 2 sets - 10 reps - tandem stance balance; try not to hold on  - 1 x daily - 7 x weekly - 1 sets - 5 reps - 15 sec hold Access Code: [email or web address] URL: [email or web address] Date: 05/18/2024 Prepared by: [facility]  Exercises - Supine Bridge  - 1 x daily - 7 x weekly - 3 sets - 10 reps - Sit to Stand Without Arm Support  - 1 x daily - 7 x weekly - 3 sets - 10 reps - Clamshell  - 1 x daily - 7 x weekly - 3 sets - 10 reps - Standing Hip Abduction with Resistance at Ankles and Counter Support  - 1 x daily - 7 x weekly - 2 sets - 10 reps - Standing Hip Extension with Resistance at Ankles and Counter Support  - 1 x daily - 7 x weekly - 2 sets - 10 reps - Standing March with Unilateral Counter Support  - 1 x daily - 7 x weekly - 2 sets - 10 reps - tandem stance balance; try not to hold on  - 1 x daily - 7 x weekly - 1 sets - 5 reps - 15 sec hold Access Code: [email or web address] URL: [email or web address] Date: 04/21/2024 Prepared by: Lang Ada  Exercises - Supine Bridge  - 1 x daily - 7 x weekly - 3 sets - 10 reps - Sit to Stand Without Arm Support   - 1 x daily - 7 x weekly - 3 sets - 10 reps - Clamshell  - 1 x daily - 7 x weekly - 3 sets - 10 reps  ASSESSMENT:  CLINICAL IMPRESSION: Therapist completed FGA increased from 10 to 12.  Pt very stiff and hesitant when ambulating therefore added PWR to program for improved movement.  Pt improving in gait with cane.    Will continue to benefit from skilled intervention to address goals unmet to reduce risk of fall injury.  Plan to continue 4 additional weeks.  Reviewed current exercises to complete at home, verbalized understanding.    Evaluation: Patient is a 83 y.o. female who was seen today for physical therapy evaluation and treatment for S72.002A (ICD-10-CM) - Closed displaced fracture of left femoral neck (HCC).  Patient demonstrates decreased LLE strength, abnormal pain in left hip with prolonged standing and walking, and impaired balance. Patient also demonstrates difficulty with ambulation during today's session with decreased left stride length, stance time, and velocity noted. Patient also demonstrates decreased quality of movement with bed mobility as it is painful rolling onto left side. Patient requires verbal and tactile cues for HEP prescription and proper form. Patient would benefit from skilled physical therapy for decreased pain and increased endurance with ambulation, increased LE strength, and balance for improved gait quality, return to higher level of function with ADLs, and progress towards therapy goals.   OBJECTIVE IMPAIRMENTS: Abnormal gait, decreased activity tolerance, decreased balance, decreased endurance, decreased mobility, difficulty walking, decreased strength, and pain.   ACTIVITY LIMITATIONS: carrying, lifting, bending, standing, squatting, stairs, transfers, bed mobility, and locomotion level  PARTICIPATION LIMITATIONS: meal prep, cleaning, laundry, driving, shopping, community activity, and yard work  PERSONAL FACTORS: Age, Fitness, Past/current experiences, and  1 comorbidity: recent fall and hip fx are also affecting patient's functional  outcome.   REHAB POTENTIAL: Good  CLINICAL DECISION MAKING: Stable/uncomplicated  EVALUATION COMPLEXITY: Low   GOALS: Goals reviewed with patient? Yes  SHORT TERM GOALS: Target date: 05/12/24  Patient will demonstrate evidence of independence with individualized HEP and will report compliance for at least 3 days per week for optimized progression towards remaining therapy goals. Baseline: 06/25/24:  Reports compliance with HEP 5 days a week Goal status: MET  2.  Patient will report a decrease in pain level during community ambulation by at least 2 points for improved quality of life. Baseline: 2/10 Goal status: MET     LONG TERM GOALS: Target date: 06/02/24  Pt will demonstrate a an increase of at least 9 points on the LEFS for improved performance of community ambulation and ADL. Baseline: see above Goal status: IN PROGRESS  2.  Pt will improve 2 MWT by 140  in order to demonstrate improved functional ambulatory capacity in community setting.  Baseline: see above; improved by 53 ft; 06/25/24:  improved by 17ft Goal status: IN PROGRESS  3.  Pt will demonstrate WFL pain free ROM in left hip, for increased mobility and maximal efficiency of gait cycle during ambulation. Baseline: see objective Goal status: IN PROGRESS  4.  Pt will demonstrate at least 4/5 MMT for left lower extremity for increased strength during ADL and community ambulation. Baseline: see objective; see above Goal status: IN PROGRESS  5.  Pt will improve 5TSTS by at least 2.3 seconds in order to improve functional strength during functional transfers. Baseline: see above Goal status: IN PROGRESS    PLAN:  PT FREQUENCY: 1-2x/week  PT DURATION: 6 weeks  PLANNED INTERVENTIONS: 97110-Therapeutic exercises, 97530- Therapeutic activity, [id number]- Neuromuscular re-education, 97535- Self Care, [id number]- Manual therapy, [phone]- Gait  training, Patient/Family education, Balance training, Stair training, Joint mobilization, DME instructions, Cryotherapy, and Moist heat  PLAN FOR NEXT SESSION:  .  Continue with balance and gait training and hip strengthening.   Montie Metro, PT CLT [id number] [facility] Office: [phone] 11:12 AM, 07/01/24

## 2024-07-01 ENCOUNTER — Encounter (HOSPITAL_COMMUNITY): Payer: Self-pay

## 2024-07-01 ENCOUNTER — Ambulatory Visit (HOSPITAL_COMMUNITY)

## 2024-07-01 DIAGNOSIS — M25552 Pain in left hip: Secondary | ICD-10-CM

## 2024-07-01 DIAGNOSIS — Z7409 Other reduced mobility: Secondary | ICD-10-CM

## 2024-07-01 DIAGNOSIS — R29898 Other symptoms and signs involving the musculoskeletal system: Secondary | ICD-10-CM | POA: Diagnosis not present

## 2024-07-01 NOTE — Therapy (Signed)
 OUTPATIENT PHYSICAL THERAPY TREATMENT    Patient Name: Tiffany Everett MRN: [id number] DOB:09-04-1941, 83 y.o., female Today's Date: 07/01/2024  END OF SESSION:  PT End of Session - 07/01/24 1526     Visit Number 19    Number of Visits 25    Date for PT Re-Evaluation 07/23/24    Authorization Type [employer] MEDICARE HMO/PPO    Progress Note Due on Visit 27    PT Start Time 1520    PT Stop Time 1559    PT Time Calculation (min) 39 min    Equipment Utilized During Treatment Gait belt    Activity Tolerance Patient tolerated treatment well    Behavior During Therapy WFL for tasks assessed/performed           PT End of Session - 07/01/24 1526     Visit Number 19    Number of Visits 25    Date for PT Re-Evaluation 07/23/24    Authorization Type [employer] MEDICARE HMO/PPO    Progress Note Due on Visit 27    PT Start Time 1520    PT Stop Time 1559    PT Time Calculation (min) 39 min    Equipment Utilized During Treatment Gait belt    Activity Tolerance Patient tolerated treatment well    Behavior During Therapy WFL for tasks assessed/performed           PT End of Session - 07/01/24 1526     Visit Number 19    Number of Visits 25    Date for PT Re-Evaluation 07/23/24    Authorization Type [employer] MEDICARE HMO/PPO    Progress Note Due on Visit 27    PT Start Time 1520    PT Stop Time 1559    PT Time Calculation (min) 39 min    Equipment Utilized During Treatment Gait belt    Activity Tolerance Patient tolerated treatment well    Behavior During Therapy WFL for tasks assessed/performed         PT End of Session - 06/29/24 1628       Visit Number 18     Number of Visits 25     Date for PT Re-Evaluation 07/23/24     Authorization Type [employer] MEDICARE HMO/PPO     Progress Note Due on Visit 27   Progress note done visit 17    PT Start Time 1520    PT Stop Time 1618    PT Time Calculation (min) 58 min     Equipment Utilized During Treatment Gait belt     Activity  Tolerance Patient tolerated treatment well     Behavior During Therapy WFL for tasks assessed/performed                 Past Medical History:  Diagnosis Date   Colon polyps    Hypertension    Seasonal allergies    Past Surgical History:  Procedure Laterality Date   APPENDECTOMY     CATARACT EXTRACTION W/PHACO Right 09/13/2013   Procedure: CATARACT EXTRACTION PHACO AND INTRAOCULAR LENS PLACEMENT (IOC);  Surgeon: Dow JULIANNA Burke, MD;  Location: [facility];  Service: Ophthalmology;  Laterality: Right;  CDE:3.25   CATARACT EXTRACTION W/PHACO Left 11/22/2013   Procedure: CATARACT EXTRACTION PHACO AND INTRAOCULAR LENS PLACEMENT (IOC);  Surgeon: Dow JULIANNA Burke, MD;  Location: [facility];  Service: Ophthalmology;  Laterality: Left;  CDE: 3.95   CESAREAN SECTION     x2   COLONOSCOPY     SLF 2010:  sigmoid diverticula, otherwise wnl. Repeat 5 yrs.   HIP ARTHROPLASTY Left 02/25/2024   Procedure: HEMIARTHROPLASTY (BIPOLAR) HIP;  Surgeon: Margrette Taft BRAVO, MD;  Location: [facility];  Service: Orthopedics;  Laterality: Left;   I & D EXTREMITY Left    lateral wrist   LAPAROTOMY     oopherectomy   ORIF HUMERUS FRACTURE Left    Patient Active Problem List   Diagnosis Date Noted   Neurocognitive deficits 03/02/2024   Gastroesophageal reflux disease 02/27/2024   Closed displaced fracture of left femoral neck (HCC) 02/24/2024   Chronic kidney disease (CKD) stage G3a/A1, moderately decreased glomerular filtration rate (GFR) between 45-59 mL/min/1.73 square meter and albuminuria creatinine ratio less than 30 mg/g (HCC) 05/23/2023   Left renal atrophy 05/08/2022   Enlarged thyroid  11/07/2021   Fatigue 04/23/2021   Irregular heartbeat 04/23/2021   Mixed hyperlipidemia 04/23/2021   Vitamin D  insufficiency 04/23/2021   Borderline prolonged QT interval 02/02/2020   Polycythemia 02/01/2020   Essential hypertension    History of colonic polyps 04/24/2015    PCP: Suanne Pfeiffer, NP   REFERRING  PROVIDER: Margrette Taft BRAVO, MD  REFERRING DIAG: [phone] (ICD-10-CM) - Closed displaced fracture of left femoral neck (HCC)  THERAPY DIAG:  Pain in left hip  Weakness of left lower extremity  Impaired functional mobility, balance, gait, and endurance  Rationale for Evaluation and Treatment: Rehabilitation  ONSET DATE: Early this year, does not remember date  SUBJECTIVE:   SUBJECTIVE STATEMENT: Pt reports no pain today. She hasn't done much today. Reports she's been using RW since fall and used nothing prior.    Evaluation: Pt states getting up early in the morning got 3/4 to the bathroom and all of a sudden left leg collapsed and fell on left side, felt immediate pain. Pt had surgery for hip fx and then went to a rehab for 2 weeks. Pt then had a couple of weeks of home PT after that. Pt states that she is not in pain and states the hip does not hurt much anymore just feels weak on that side. Before the fall she was not using a walker and was driving.  PERTINENT HISTORY: Hip Fx PAIN:  Are you having pain? No and Yes: NPRS scale: 2/10 Pain location: Lt hip Pain description: Sharp Aggravating factors: Positioning Relieving factors: Rearrange positioning  PRECAUTIONS: Fall  RED FLAGS: None   WEIGHT BEARING RESTRICTIONS: No  FALLS:  Has patient fallen in last 6 months? Yes. Number of falls 1  LIVING ENVIRONMENT: Lives with: lives with their family and lives with their son Lives in: Mobile home Stairs: ramped entrance Has following equipment at home: Single point cane, Environmental consultant - 2 wheeled, shower chair, and Ramped entry  OCCUPATION: retired  PLOF: Independent  PATIENT GOALS: To get back where she was; walk without the walker, get stronger left lower extremity. Pt would like to return to yard work and driving as well.  NEXT MD VISIT: maybe next Friday   OBJECTIVE:  Note: Objective measures were completed at Evaluation unless otherwise noted.  DIAGNOSTIC FINDINGS:  CLINICAL DATA:  Fall and left hip pain.   EXAM: DG HIP (WITH OR WITHOUT PELVIS) 2-3V LEFT   COMPARISON:  Pelvic radiograph dated 02/01/2020.   FINDINGS: There is a mildly displaced and impacted fracture of the left femoral neck. No dislocation. The bones are osteopenic. The soft tissues are unremarkable.   IMPRESSION: Mildly displaced and impacted fracture of the left femoral neck.  PATIENT SURVEYS:  LEFS 39/80  06/25/24:  Lower Extremity Functional Score: 34 / 80 = 42.5 %  COGNITION: Overall cognitive status: Within functional limits for tasks assessed     SENSATION: WFL  EDEMA:  None observed, none reported    PALPATION: Pt reports no tenderness of left hip, assess OP next session  LOWER EXTREMITY ROM:  Active ROM Right eval Left eval Left 7/18/235  Hip flexion 102 100 103  Hip extension 5/20: 5 5/20: 0 06/25/24: 3   Hip abduction     Hip adduction     Hip internal rotation     Hip external rotation     Knee flexion     Knee extension     Ankle dorsiflexion     Ankle plantarflexion     Ankle inversion     Ankle eversion      (Blank rows = not tested)  LOWER EXTREMITY MMT:  MMT Right eval Left eval Left 05/24/24 Left  06/25/25 Right 06/25/24  Hip flexion 4 3+ 4 4 4   Hip extension 4 3+ 2 Prone: 2+ 3+  Hip abduction 4 3+ 2 Sidelying:  2+  3+  Hip adduction 4 3+     Hip internal rotation       Hip external rotation       Knee flexion    4 4  Knee extension   4 4 4+  Ankle dorsiflexion (limited range bilat)   Limited range 4    Ankle plantarflexion       Ankle inversion       Ankle eversion        (Blank rows = not tested)   FUNCTIONAL TESTS:  5 times sit to stand: 16.05 2 minute walk test: 182 feet  06/25/24: 275ft with SPC.  5STS  28.83; 22.48 no HHA  04/27/24:  Functional Gait Assessment Summary completed with RW 1. GAIT LEVEL SURFACE: Moderate impairment -- gait level surface (1) (1 points) 2. CHANGE IN GAIT SPEED: Moderate impairment  -- change in gait speed (1) (1 points) 3. GAIT WITH HORIZONTAL HEAD TURNS: Moderate impairment -- gait with horizontal head turns (1) (1 points) 4. GAIT WITH VERTICAL HEAD TURNS: Moderate impairment -- gait with vertical head turns (1) (1 points) 5. GAIT AND PIVOT TURN: Moderate impairment -- gait and pivot turn (1) (1 points) 6. STEP OVER OBSTACLE: Moderate impairment -- step over obstacle (1) (1 points) 7. GAIT WITH NARROW BASE OF SUPPORT: Moderate impairment -- gait with narrow base of support (1) (1 points) 8. GAIT WITH EYES CLOSED: Severe impairment -- gait with eyes closed (0) (0 points) 9. AMBULATING BACKWARDS: Moderate impairment -- ambulating backwards (1) (1 points) 10. STEPS: Mild impairment -- up and down steps (2) (2 points) Functional Gait Assessment: 10/30=33.3 percent. (Normal for age >20/30)   GAIT: Distance walked: 182 feet Assistive device utilized: Walker - 2 wheeled Level of assistance: Modified independence Comments: Pt demonstrates decreased stride length and stance time on left side.  TREATMENT DATE:  07/01/24: Gait training: 226 ft w/ RW, v cues for speed and foot clearance NuStep, seat 10, level 3, 4' Sit to stand holding 5 lb. kettle bell, foam pad in chair, v cues for posture  +10 lb. Kettlebell, 10x  + kettle lifts to chest, 10x Forward step ups, 6 inch step, 10x w/ ea LE leading, BUE-->one UE Prog to standing w/ feet on foam and stepping onto step, 10x w/ each LE leading, BUE-->two finger support Therapeutic Act: Squatting to pick up 5 cones at one end of 10 ft line, side stepping to other end and place cones down at ground level, CGA throughout   06/25/24: 270 with SPC MMT ROM Lower Extremity Functional Score: 34 / 80 = 42.5 % 5STS complete twice with no HHA: 28.83; 22.48 no HHA  FUNCTIONAL GAIT ASSESSMENT  Date:  07/18 Score  GAIT LEVEL SURFACE Instructions: Walk at your normal speed from here to the next mark (6 m) [20 ft]. (2) Mild impairment - Walks 6 m (20 ft) in less than 7 seconds but greater than 5.5 seconds, uses assistive device, slower speed, mild gait deviations, or deviates 15.24 -25.4 cm (6 -10 in) outside of the 30.48-cm (12-in) walkway width.  2.   CHANGE IN GAIT SPEED Instructions: Begin walking at your normal pace (for 1.5 m [5 ft]). When I tell you "go," walk as fast as you can (for 1.5 m [5 ft]). When I tell you "slow," walk as slowly as you can (for 1.5 m [5 ft]. (1) Moderate impairment - Makes only minor adjustments to walking speed, or accomplishes a change in speed with significant gait deviations, deviates 25.4 -38.1 cm (10 -15 in) outside the 30.48-cm (12-in) walkway width, or changes speed but loses balance but is able to recover and continue walking.  3.    GAIT WITH HORIZONTAL HEAD TURNS Instructions: Walk from here to the next mark 6 m (20 ft) away. Begin walking at your normal pace. Keep walking straight; after 3 steps, turn your head to the right and keep walking straight while looking to the right. After 3 more steps, turn your head to the left and keep walking straight while looking left. Continue alternating looking right and left. (1) Moderate impairment - Performs head turns with moderate change in gait velocity, slows down, deviates 25.4 -38.1 cm (10 -15 in) outside 30.48-cm (12-in) walkway width but recovers, can continue to walk.  4.   GAIT WITH VERTICAL HEAD TURNS Instructions: Walk from here to the next mark (6 m [20 ft]). Begin walking at your normal pace. Keep walking straight; after 3 steps, tip your head up and keep walking straight while looking up. After 3 more steps, tip your head down, keep walking straight while looking down. Continue  alternating looking up and down every 3 steps until you have completed 2 repetitions in each direction. (1) Moderate impairment -  Performs task with moderate change in gait velocity, slows down, deviates 25.4 -38.1 cm (10 -15 in) outside 30.48-cm (12-in) walkway width but recovers, can continue to walk.  5.  GAIT AND PIVOT TURN Instructions: Begin with walking at your normal pace. When I tell you, "turn and stop," turn as quickly as you can to face the opposite direction and stop. (1) Moderate impairment - Turns slowly, requires verbal cueing, or requires several small steps to catch balance following turn and stop  6.   STEP OVER OBSTACLE Instructions: Begin walking at your normal speed. When you  come to the shoe box, step over it, not around it, and keep walking.  2  7.   GAIT WITH NARROW BASE OF SUPPORT Instructions: Walk on the floor with arms folded across the chest, feet aligned heel to toe in tandem for a distance of 3.6 m [12 ft]. The number of steps taken in a straight line are counted for a maximum of 10 steps. 1  8.   GAIT WITH EYES CLOSED Instructions: Walk at your normal speed from here to the next mark (6 m [20 ft]) with your eyes closed. 0  9.   AMBULATING BACKWARDS Instructions: Walk backwards until I tell you to stop 1  10. STEPS Instructions: Walk up these stairs as you would at home (ie, using the rail if necessary). At the top turn around and walk down. (1) Moderate impairment-Two feet to a stair; must use rail.  Total 12/30    06/29/24: Rocker board x 2 minutes.  Marching x 10  Heel raises x 10 Knee driver x 5 for 10 B Hamstring stretch x 5 for 10 B  Squat no UE assist x 10 Step up 7 step x 10 B SLS required HHA x 3  POWER up x 7 Power rock x 7 Power twist x 7  Power step x 7 Theraband side step with green band around mat x 2 Nustep level 1 at or above 60 STM x 5 minutes   PATIENT EDUCATION:  Education details: Pt was educated on findings of PT evaluation, prognosis, frequency of therapy visits and rationale, attendance policy, and HEP if given.   Person educated: Patient Education  method: Explanation, Verbal cues, and Handouts Education comprehension: verbalized understanding, verbal cues required, tactile cues required, and needs further education  HOME EXERCISE PROGRAM: Access Code: [email or web address] URL: [email or web address] Date: 05/18/2024 Prepared by: [facility]  06/10/24: - Side Stepping with Resistance at Thighs and Counter Support  - 1-2 x daily - 7 x weekly - 2 sets - 10 reps - Squat with Chair and Counter Support  - 1 x daily - 7 x weekly - 3 sets - 10 reps  Exercises - Standing Hip Abduction with Resistance at Ankles and Counter Support  - 1 x daily - 7 x weekly - 2 sets - 10 reps - Standing Hip Extension with Resistance at Ankles and Counter Support  - 1 x daily - 7 x weekly - 2 sets - 10 reps - Standing March with Unilateral Counter Support  - 1 x daily - 7 x weekly - 2 sets - 10 reps - tandem stance balance; try not to hold on  - 1 x daily - 7 x weekly - 1 sets - 5 reps - 15 sec hold Access Code: [email or web address] URL: [email or web address] Date: 05/18/2024 Prepared by: [facility]  Exercises - Supine Bridge  - 1 x daily - 7 x weekly - 3 sets - 10 reps - Sit to Stand Without Arm Support  - 1 x daily - 7 x weekly - 3 sets - 10 reps - Clamshell  - 1 x daily - 7 x weekly - 3 sets - 10 reps - Standing Hip Abduction with Resistance at Ankles and Counter Support  - 1 x daily - 7 x weekly - 2 sets - 10 reps - Standing Hip Extension with Resistance at Ankles and Counter Support  - 1 x daily - 7 x weekly - 2 sets - 10 reps - Standing March with Unilateral Counter Support  -  1 x daily - 7 x weekly - 2 sets - 10 reps - tandem stance balance; try not to hold on  - 1 x daily - 7 x weekly - 1 sets - 5 reps - 15 sec hold Access Code: [email or web address] URL: [email or web address] Date: 04/21/2024 Prepared by: Lang Ada  Exercises - Supine Bridge  - 1 x daily - 7 x weekly - 3 sets - 10 reps - Sit to Stand Without Arm Support  - 1 x daily - 7 x  weekly - 3 sets - 10 reps - Clamshell  - 1 x daily - 7 x weekly - 3 sets - 10 reps  ASSESSMENT:  CLINICAL IMPRESSION:  Patient tolerated session well. Began with gait training around clinic, pt requiring verbal cueing for step clearance, and speed as pt tend to slow speed and drag feet with inc fatigue. NuStep on level 3 and progressed STS for general strengthening. To challenge balance, patient performs progressed step ups. Feels she is unable to perform without any support. Progresses to using just two fingers of one UE for support and CGA. Most difficulty with SLS of note. V cueing for upright posture and foot clearance during last therapeutic activity as patient tends to exhibit inc forward lean and foot drag, R>L, when fatigued. Improves with verbal cueing. Pt will continue to benefit from skilled intervention to address goals unmet to reduce risk of fall injury.    Evaluation: Patient is a 83 y.o. female who was seen today for physical therapy evaluation and treatment for S72.002A (ICD-10-CM) - Closed displaced fracture of left femoral neck (HCC).  Patient demonstrates decreased LLE strength, abnormal pain in left hip with prolonged standing and walking, and impaired balance. Patient also demonstrates difficulty with ambulation during today's session with decreased left stride length, stance time, and velocity noted. Patient also demonstrates decreased quality of movement with bed mobility as it is painful rolling onto left side. Patient requires verbal and tactile cues for HEP prescription and proper form. Patient would benefit from skilled physical therapy for decreased pain and increased endurance with ambulation, increased LE strength, and balance for improved gait quality, return to higher level of function with ADLs, and progress towards therapy goals.   OBJECTIVE IMPAIRMENTS: Abnormal gait, decreased activity tolerance, decreased balance, decreased endurance, decreased mobility, difficulty  walking, decreased strength, and pain.   ACTIVITY LIMITATIONS: carrying, lifting, bending, standing, squatting, stairs, transfers, bed mobility, and locomotion level  PARTICIPATION LIMITATIONS: meal prep, cleaning, laundry, driving, shopping, community activity, and yard work  PERSONAL FACTORS: Age, Fitness, Past/current experiences, and 1 comorbidity: recent fall and hip fx are also affecting patient's functional outcome.   REHAB POTENTIAL: Good  CLINICAL DECISION MAKING: Stable/uncomplicated  EVALUATION COMPLEXITY: Low   GOALS: Goals reviewed with patient? Yes  SHORT TERM GOALS: Target date: 05/12/24  Patient will demonstrate evidence of independence with individualized HEP and will report compliance for at least 3 days per week for optimized progression towards remaining therapy goals. Baseline: 06/25/24:  Reports compliance with HEP 5 days a week Goal status: MET  2.  Patient will report a decrease in pain level during community ambulation by at least 2 points for improved quality of life. Baseline: 2/10 Goal status: MET     LONG TERM GOALS: Target date: 06/02/24  Pt will demonstrate a an increase of at least 9 points on the LEFS for improved performance of community ambulation and ADL. Baseline: see above Goal status: IN PROGRESS  2.  Pt will improve 2  MWT by 140  in order to demonstrate improved functional ambulatory capacity in community setting.  Baseline: see above; improved by 53 ft; 06/25/24:  improved by 51ft Goal status: IN PROGRESS  3.  Pt will demonstrate WFL pain free ROM in left hip, for increased mobility and maximal efficiency of gait cycle during ambulation. Baseline: see objective Goal status: IN PROGRESS  4.  Pt will demonstrate at least 4/5 MMT for left lower extremity for increased strength during ADL and community ambulation. Baseline: see objective; see above Goal status: IN PROGRESS  5.  Pt will improve 5TSTS by at least 2.3 seconds in  order to improve functional strength during functional transfers. Baseline: see above Goal status: IN PROGRESS    PLAN:  PT FREQUENCY: 1-2x/week  PT DURATION: 6 weeks  PLANNED INTERVENTIONS: 97110-Therapeutic exercises, 97530- Therapeutic activity, 97112- Neuromuscular re-education, 97535- Self Care, [id number]- Manual therapy, [phone]- Gait training, Patient/Family education, Balance training, Stair training, Joint mobilization, DME instructions, Cryotherapy, and Moist heat  PLAN FOR NEXT SESSION:  .  Continue with balance and gait training and hip strengthening.     5:56 PM, 07/01/24 Rosaria Settler, PT, DPT [facility] [place]

## 2024-07-06 ENCOUNTER — Ambulatory Visit (HOSPITAL_COMMUNITY)

## 2024-07-06 DIAGNOSIS — Z7409 Other reduced mobility: Secondary | ICD-10-CM | POA: Diagnosis not present

## 2024-07-06 DIAGNOSIS — R29898 Other symptoms and signs involving the musculoskeletal system: Secondary | ICD-10-CM

## 2024-07-06 DIAGNOSIS — M25552 Pain in left hip: Secondary | ICD-10-CM | POA: Diagnosis not present

## 2024-07-06 NOTE — Therapy (Signed)
 OUTPATIENT PHYSICAL THERAPY TREATMENT    Patient Name: Tiffany Everett MRN: [id number] DOB:28-Jun-1941, 83 y.o., female Today's Date: 07/06/2024  END OF SESSION:  PT End of Session - 07/06/24 1140     Visit Number 20    Number of Visits 25    Date for PT Re-Evaluation 07/23/24    Authorization Type [employer] MEDICARE HMO/PPO    Progress Note Due on Visit 27    PT Start Time 1145    PT Stop Time 1225    PT Time Calculation (min) 40 min    Equipment Utilized During Treatment Gait belt    Activity Tolerance Patient tolerated treatment well    Behavior During Therapy WFL for tasks assessed/performed                    Past Medical History:  Diagnosis Date   Colon polyps    Hypertension    Seasonal allergies    Past Surgical History:  Procedure Laterality Date   APPENDECTOMY     CATARACT EXTRACTION W/PHACO Right 09/13/2013   Procedure: CATARACT EXTRACTION PHACO AND INTRAOCULAR LENS PLACEMENT (IOC);  Surgeon: Dow JULIANNA Burke, MD;  Location: [facility];  Service: Ophthalmology;  Laterality: Right;  CDE:3.25   CATARACT EXTRACTION W/PHACO Left 11/22/2013   Procedure: CATARACT EXTRACTION PHACO AND INTRAOCULAR LENS PLACEMENT (IOC);  Surgeon: Dow JULIANNA Burke, MD;  Location: [facility];  Service: Ophthalmology;  Laterality: Left;  CDE: 3.95   CESAREAN SECTION     x2   COLONOSCOPY     SLF 2010: sigmoid diverticula, otherwise wnl. Repeat 5 yrs.   HIP ARTHROPLASTY Left 02/25/2024   Procedure: HEMIARTHROPLASTY (BIPOLAR) HIP;  Surgeon: Margrette Taft BRAVO, MD;  Location: [facility];  Service: Orthopedics;  Laterality: Left;   I & D EXTREMITY Left    lateral wrist   LAPAROTOMY     oopherectomy   ORIF HUMERUS FRACTURE Left    Patient Active Problem List   Diagnosis Date Noted   Neurocognitive deficits 03/02/2024   Gastroesophageal reflux disease 02/27/2024   Closed displaced fracture of left femoral neck (HCC) 02/24/2024   Chronic kidney disease (CKD) stage G3a/A1, moderately decreased  glomerular filtration rate (GFR) between 45-59 mL/min/1.73 square meter and albuminuria creatinine ratio less than 30 mg/g (HCC) 05/23/2023   Left renal atrophy 05/08/2022   Enlarged thyroid  11/07/2021   Fatigue 04/23/2021   Irregular heartbeat 04/23/2021   Mixed hyperlipidemia 04/23/2021   Vitamin D  insufficiency 04/23/2021   Borderline prolonged QT interval 02/02/2020   Polycythemia 02/01/2020   Essential hypertension    History of colonic polyps 04/24/2015    PCP: Suanne Pfeiffer, NP   REFERRING PROVIDER: Margrette Taft BRAVO, MD  REFERRING DIAG: [phone] (ICD-10-CM) - Closed displaced fracture of left femoral neck (HCC)  THERAPY DIAG:  Pain in left hip  Weakness of left lower extremity  Impaired functional mobility, balance, gait, and endurance  Rationale for Evaluation and Treatment: Rehabilitation  ONSET DATE: Early this year, does not remember date  SUBJECTIVE:   SUBJECTIVE STATEMENT: 3/10 pain in the left hip today.  When asked If she is practicing walking with cane her son says yes and she states not really  Evaluation: Pt states getting up early in the morning got 3/4 to the bathroom and all of a sudden left leg collapsed and fell on left side, felt immediate pain. Pt had surgery for hip fx and then went to a rehab for 2 weeks. Pt then had a couple of weeks of  home PT after that. Pt states that she is not in pain and states the hip does not hurt much anymore just feels weak on that side. Before the fall she was not using a walker and was driving.  PERTINENT HISTORY: Hip Fx PAIN:  Are you having pain? No and Yes: NPRS scale: 2/10 Pain location: Lt hip Pain description: Sharp Aggravating factors: Positioning Relieving factors: Rearrange positioning  PRECAUTIONS: Fall  RED FLAGS: None   WEIGHT BEARING RESTRICTIONS: No  FALLS:  Has patient fallen in last 6 months? Yes. Number of falls 1  LIVING ENVIRONMENT: Lives with: lives with their family and  lives with their son Lives in: Mobile home Stairs: ramped entrance Has following equipment at home: Single point cane, Environmental consultant - 2 wheeled, shower chair, and Ramped entry  OCCUPATION: retired  PLOF: Independent  PATIENT GOALS: To get back where she was; walk without the walker, get stronger left lower extremity. Pt would like to return to yard work and driving as well.  NEXT MD VISIT: maybe next Friday   OBJECTIVE:  Note: Objective measures were completed at Evaluation unless otherwise noted.  DIAGNOSTIC FINDINGS: CLINICAL DATA:  Fall and left hip pain.   EXAM: DG HIP (WITH OR WITHOUT PELVIS) 2-3V LEFT   COMPARISON:  Pelvic radiograph dated 02/01/2020.   FINDINGS: There is a mildly displaced and impacted fracture of the left femoral neck. No dislocation. The bones are osteopenic. The soft tissues are unremarkable.   IMPRESSION: Mildly displaced and impacted fracture of the left femoral neck.  PATIENT SURVEYS:  LEFS 39/80 06/25/24:  Lower Extremity Functional Score: 34 / 80 = 42.5 %  COGNITION: Overall cognitive status: Within functional limits for tasks assessed     SENSATION: WFL  EDEMA:  None observed, none reported    PALPATION: Pt reports no tenderness of left hip, assess OP next session  LOWER EXTREMITY ROM:  Active ROM Right eval Left eval Left 7/18/235  Hip flexion 102 100 103  Hip extension 5/20: 5 5/20: 0 06/25/24: 3   Hip abduction     Hip adduction     Hip internal rotation     Hip external rotation     Knee flexion     Knee extension     Ankle dorsiflexion     Ankle plantarflexion     Ankle inversion     Ankle eversion      (Blank rows = not tested)  LOWER EXTREMITY MMT:  MMT Right eval Left eval Left 05/24/24 Left  06/25/25 Right 06/25/24  Hip flexion 4 3+ 4 4 4   Hip extension 4 3+ 2 Prone: 2+ 3+  Hip abduction 4 3+ 2 Sidelying:  2+  3+  Hip adduction 4 3+     Hip internal rotation       Hip external rotation       Knee flexion     4 4  Knee extension   4 4 4+  Ankle dorsiflexion (limited range bilat)   Limited range 4    Ankle plantarflexion       Ankle inversion       Ankle eversion        (Blank rows = not tested)   FUNCTIONAL TESTS:  5 times sit to stand: 16.05 2 minute walk test: 182 feet  06/25/24: 271ft with SPC.  5STS  28.83; 22.48 no HHA  04/27/24:  Functional Gait Assessment Summary completed with RW 1. GAIT LEVEL SURFACE: Moderate impairment -- gait level  surface (1) (1 points) 2. CHANGE IN GAIT SPEED: Moderate impairment -- change in gait speed (1) (1 points) 3. GAIT WITH HORIZONTAL HEAD TURNS: Moderate impairment -- gait with horizontal head turns (1) (1 points) 4. GAIT WITH VERTICAL HEAD TURNS: Moderate impairment -- gait with vertical head turns (1) (1 points) 5. GAIT AND PIVOT TURN: Moderate impairment -- gait and pivot turn (1) (1 points) 6. STEP OVER OBSTACLE: Moderate impairment -- step over obstacle (1) (1 points) 7. GAIT WITH NARROW BASE OF SUPPORT: Moderate impairment -- gait with narrow base of support (1) (1 points) 8. GAIT WITH EYES CLOSED: Severe impairment -- gait with eyes closed (0) (0 points) 9. AMBULATING BACKWARDS: Moderate impairment -- ambulating backwards (1) (1 points) 10. STEPS: Mild impairment -- up and down steps (2) (2 points) Functional Gait Assessment: 10/30=33.3 percent. (Normal for age >20/30)   GAIT: Distance walked: 182 feet Assistive device utilized: Walker - 2 wheeled Level of assistance: Modified independence Comments: Pt demonstrates decreased stride length and stance time on left side.                                                                                                                                TREATMENT DATE:  07/06/24 Heel raises 2 x 12 Toe raises 2 x 12 7 steps toe taps with 1 UE assist 2 x 10 Step navigation up and down x 2 Lunge forward and back 2 x 10 each with 1 UE assist  2# hip abduction 2 x 10 2# hip extension 2 x  10 2# marching 2 x 10 Squats with no UE assist 2 x 10 Nustep seat 10 x 5' level 2 to end treatment     07/01/24: Gait training: 226 ft w/ RW, v cues for speed and foot clearance NuStep, seat 10, level 3, 4' Sit to stand holding 5 lb. kettle bell, foam pad in chair, v cues for posture  +10 lb. Kettlebell, 10x  + kettle lifts to chest, 10x Forward step ups, 6 inch step, 10x w/ ea LE leading, BUE-->one UE Prog to standing w/ feet on foam and stepping onto step, 10x w/ each LE leading, BUE-->two finger support Therapeutic Act: Squatting to pick up 5 cones at one end of 10 ft line, side stepping to other end and place cones down at ground level, CGA throughout   06/25/24: 270 with SPC MMT ROM Lower Extremity Functional Score: 34 / 80 = 42.5 % 5STS complete twice with no HHA: 28.83; 22.48 no HHA  FUNCTIONAL GAIT ASSESSMENT  Date: 07/18 Score  GAIT LEVEL SURFACE Instructions: Walk at your normal speed from here to the next mark (6 m) [20 ft]. (2) Mild impairment - Walks 6 m (20 ft) in less than 7 seconds but greater than 5.5 seconds, uses assistive device, slower speed, mild gait deviations, or deviates 15.24 -25.4 cm (6 -10 in) outside of the 30.48-cm (12-in) walkway width.  2.  CHANGE IN GAIT SPEED Instructions: Begin walking at your normal pace (for 1.5 m [5 ft]). When I tell you "go," walk as fast as you can (for 1.5 m [5 ft]). When I tell you "slow," walk as slowly as you can (for 1.5 m [5 ft]. (1) Moderate impairment - Makes only minor adjustments to walking speed, or accomplishes a change in speed with significant gait deviations, deviates 25.4 -38.1 cm (10 -15 in) outside the 30.48-cm (12-in) walkway width, or changes speed but loses balance but is able to recover and continue walking.  3.    GAIT WITH HORIZONTAL HEAD TURNS Instructions: Walk from here to the next mark 6 m (20 ft) away. Begin walking at your normal pace. Keep walking straight; after 3 steps, turn your head to  the right and keep walking straight while looking to the right. After 3 more steps, turn your head to the left and keep walking straight while looking left. Continue alternating looking right and left. (1) Moderate impairment - Performs head turns with moderate change in gait velocity, slows down, deviates 25.4 -38.1 cm (10 -15 in) outside 30.48-cm (12-in) walkway width but recovers, can continue to walk.  4.   GAIT WITH VERTICAL HEAD TURNS Instructions: Walk from here to the next mark (6 m [20 ft]). Begin walking at your normal pace. Keep walking straight; after 3 steps, tip your head up and keep walking straight while looking up. After 3 more steps, tip your head down, keep walking straight while looking down. Continue  alternating looking up and down every 3 steps until you have completed 2 repetitions in each direction. (1) Moderate impairment - Performs task with moderate change in gait velocity, slows down, deviates 25.4 -38.1 cm (10 -15 in) outside 30.48-cm (12-in) walkway width but recovers, can continue to walk.  5.  GAIT AND PIVOT TURN Instructions: Begin with walking at your normal pace. When I tell you, "turn and stop," turn as quickly as you can to face the opposite direction and stop. (1) Moderate impairment - Turns slowly, requires verbal cueing, or requires several small steps to catch balance following turn and stop  6.   STEP OVER OBSTACLE Instructions: Begin walking at your normal speed. When you come to the shoe box, step over it, not around it, and keep walking.  2  7.   GAIT WITH NARROW BASE OF SUPPORT Instructions: Walk on the floor with arms folded across the chest, feet aligned heel to toe in tandem for a distance of 3.6 m [12 ft]. The number of steps taken in a straight line are counted for a maximum of 10 steps. 1  8.   GAIT WITH EYES CLOSED Instructions: Walk at your normal speed from here to the next mark (6 m [20 ft]) with your eyes closed. 0  9.   AMBULATING  BACKWARDS Instructions: Walk backwards until I tell you to stop 1  10. STEPS Instructions: Walk up these stairs as you would at home (ie, using the rail if necessary). At the top turn around and walk down. (1) Moderate impairment-Two feet to a stair; must use rail.  Total 12/30    06/29/24: Rocker board x 2 minutes.  Marching x 10  Heel raises x 10 Knee driver x 5 for 10 B Hamstring stretch x 5 for 10 B  Squat no UE assist x 10 Step up 7 step x 10 B SLS required HHA x 3  POWER up x 7 Power rock x 7  Power twist x 7  Power step x 7 Theraband side step with green band around mat x 2 Nustep level 1 at or above 60 STM x 5 minutes   PATIENT EDUCATION:  Education details: Pt was educated on findings of PT evaluation, prognosis, frequency of therapy visits and rationale, attendance policy, and HEP if given.   Person educated: Patient Education method: Explanation, Verbal cues, and Handouts Education comprehension: verbalized understanding, verbal cues required, tactile cues required, and needs further education  HOME EXERCISE PROGRAM: Access Code: [email or web address] URL: [email or web address] Date: 05/18/2024 Prepared by: [facility]  06/10/24: - Side Stepping with Resistance at Thighs and Counter Support  - 1-2 x daily - 7 x weekly - 2 sets - 10 reps - Squat with Chair and Counter Support  - 1 x daily - 7 x weekly - 3 sets - 10 reps  Exercises - Standing Hip Abduction with Resistance at Ankles and Counter Support  - 1 x daily - 7 x weekly - 2 sets - 10 reps - Standing Hip Extension with Resistance at Ankles and Counter Support  - 1 x daily - 7 x weekly - 2 sets - 10 reps - Standing March with Unilateral Counter Support  - 1 x daily - 7 x weekly - 2 sets - 10 reps - tandem stance balance; try not to hold on  - 1 x daily - 7 x weekly - 1 sets - 5 reps - 15 sec hold Access Code: [email or web address] URL: [email or web address] Date: 05/18/2024 Prepared by: [facility]  [facility]  Exercises - Supine Bridge  - 1 x daily - 7 x weekly - 3 sets - 10 reps - Sit to Stand Without Arm Support  - 1 x daily - 7 x weekly - 3 sets - 10 reps - Clamshell  - 1 x daily - 7 x weekly - 3 sets - 10 reps - Standing Hip Abduction with Resistance at Ankles and Counter Support  - 1 x daily - 7 x weekly - 2 sets - 10 reps - Standing Hip Extension with Resistance at Ankles and Counter Support  - 1 x daily - 7 x weekly - 2 sets - 10 reps - Standing March with Unilateral Counter Support  - 1 x daily - 7 x weekly - 2 sets - 10 reps - tandem stance balance; try not to hold on  - 1 x daily - 7 x weekly - 1 sets - 5 reps - 15 sec hold Access Code: [email or web address] URL: [email or web address] Date: 04/21/2024 Prepared by: Lang Ada  Exercises - Supine Bridge  - 1 x daily - 7 x weekly - 3 sets - 10 reps - Sit to Stand Without Arm Support  - 1 x daily - 7 x weekly - 3 sets - 10 reps - Clamshell  - 1 x daily - 7 x weekly - 3 sets - 10 reps  ASSESSMENT:  CLINICAL IMPRESSION:  Patient able to perform reciprocal pattern up steps with 1 UE assist but needs to do step to step with 1 UE assist on way down for safety.   Needs cues to fully back up to chair before sitting down.  Needs continued cues with hip abduction and extension for form. Ended with Nustep today.   Pt will continue to benefit from skilled intervention to address goals unmet to reduce risk of fall injury.    Evaluation: Patient is a 83 y.o. female who was seen today for physical therapy evaluation and treatment for  S72.002A (ICD-10-CM) - Closed displaced fracture of left femoral neck (HCC).  Patient demonstrates decreased LLE strength, abnormal pain in left hip with prolonged standing and walking, and impaired balance. Patient also demonstrates difficulty with ambulation during today's session with decreased left stride length, stance time, and velocity noted. Patient also demonstrates decreased quality of movement with bed  mobility as it is painful rolling onto left side. Patient requires verbal and tactile cues for HEP prescription and proper form. Patient would benefit from skilled physical therapy for decreased pain and increased endurance with ambulation, increased LE strength, and balance for improved gait quality, return to higher level of function with ADLs, and progress towards therapy goals.   OBJECTIVE IMPAIRMENTS: Abnormal gait, decreased activity tolerance, decreased balance, decreased endurance, decreased mobility, difficulty walking, decreased strength, and pain.   ACTIVITY LIMITATIONS: carrying, lifting, bending, standing, squatting, stairs, transfers, bed mobility, and locomotion level  PARTICIPATION LIMITATIONS: meal prep, cleaning, laundry, driving, shopping, community activity, and yard work  PERSONAL FACTORS: Age, Fitness, Past/current experiences, and 1 comorbidity: recent fall and hip fx are also affecting patient's functional outcome.   REHAB POTENTIAL: Good  CLINICAL DECISION MAKING: Stable/uncomplicated  EVALUATION COMPLEXITY: Low   GOALS: Goals reviewed with patient? Yes  SHORT TERM GOALS: Target date: 05/12/24  Patient will demonstrate evidence of independence with individualized HEP and will report compliance for at least 3 days per week for optimized progression towards remaining therapy goals. Baseline: 06/25/24:  Reports compliance with HEP 5 days a week Goal status: MET  2.  Patient will report a decrease in pain level during community ambulation by at least 2 points for improved quality of life. Baseline: 2/10 Goal status: MET     LONG TERM GOALS: Target date: 06/02/24  Pt will demonstrate a an increase of at least 9 points on the LEFS for improved performance of community ambulation and ADL. Baseline: see above Goal status: IN PROGRESS  2.  Pt will improve 2 MWT by 140  in order to demonstrate improved functional ambulatory capacity in community setting.   Baseline: see above; improved by 53 ft; 06/25/24:  improved by 65ft Goal status: IN PROGRESS  3.  Pt will demonstrate WFL pain free ROM in left hip, for increased mobility and maximal efficiency of gait cycle during ambulation. Baseline: see objective Goal status: IN PROGRESS  4.  Pt will demonstrate at least 4/5 MMT for left lower extremity for increased strength during ADL and community ambulation. Baseline: see objective; see above Goal status: IN PROGRESS  5.  Pt will improve 5TSTS by at least 2.3 seconds in order to improve functional strength during functional transfers. Baseline: see above Goal status: IN PROGRESS    PLAN:  PT FREQUENCY: 1-2x/week  PT DURATION: 6 weeks  PLANNED INTERVENTIONS: 97110-Therapeutic exercises, 97530- Therapeutic activity, [id number]- Neuromuscular re-education, 97535- Self Care, [id number]- Manual therapy, [phone]- Gait training, Patient/Family education, Balance training, Stair training, Joint mobilization, DME instructions, Cryotherapy, and Moist heat  PLAN FOR NEXT SESSION:  .  Continue with balance and gait training and hip strengthening.     12:32 PM, 07/06/24 Harpreet Signore Small Sen[place] Cl[place]atre MPT [facility] [place] [phone]

## 2024-07-08 ENCOUNTER — Ambulatory Visit (HOSPITAL_COMMUNITY)

## 2024-07-08 DIAGNOSIS — Z7409 Other reduced mobility: Secondary | ICD-10-CM | POA: Diagnosis not present

## 2024-07-08 DIAGNOSIS — R29898 Other symptoms and signs involving the musculoskeletal system: Secondary | ICD-10-CM | POA: Diagnosis not present

## 2024-07-08 DIAGNOSIS — M25552 Pain in left hip: Secondary | ICD-10-CM | POA: Diagnosis not present

## 2024-07-08 NOTE — Therapy (Signed)
 OUTPATIENT PHYSICAL THERAPY TREATMENT    Patient Name: Tiffany Everett MRN: [id number] DOB:[birth year], 83 y.o., female Today's Date: 07/08/2024  END OF SESSION:  PT End of Session - 07/08/24 0939     Visit Number 21    Number of Visits 25    PT Start Time 0934    PT Stop Time 1016    PT Time Calculation (min) 42 min    Equipment Utilized During Treatment Gait belt    Activity Tolerance Patient tolerated treatment well    Behavior During Therapy WFL for tasks assessed/performed                     Past Medical History:  Diagnosis Date   Colon polyps    Hypertension    Seasonal allergies    Past Surgical History:  Procedure Laterality Date   APPENDECTOMY     CATARACT EXTRACTION W/PHACO Right 09/13/2013   Procedure: CATARACT EXTRACTION PHACO AND INTRAOCULAR LENS PLACEMENT (IOC);  Surgeon: Dow JULIANNA Burke, MD;  Location: [facility];  Service: Ophthalmology;  Laterality: Right;  CDE:3.25   CATARACT EXTRACTION W/PHACO Left 11/22/2013   Procedure: CATARACT EXTRACTION PHACO AND INTRAOCULAR LENS PLACEMENT (IOC);  Surgeon: Dow JULIANNA Burke, MD;  Location: [facility];  Service: Ophthalmology;  Laterality: Left;  CDE: 3.95   CESAREAN SECTION     x2   COLONOSCOPY     SLF 2010: sigmoid diverticula, otherwise wnl. Repeat 5 yrs.   HIP ARTHROPLASTY Left 02/25/2024   Procedure: HEMIARTHROPLASTY (BIPOLAR) HIP;  Surgeon: Margrette Taft BRAVO, MD;  Location: [facility];  Service: Orthopedics;  Laterality: Left;   I & D EXTREMITY Left    lateral wrist   LAPAROTOMY     oopherectomy   ORIF HUMERUS FRACTURE Left    Patient Active Problem List   Diagnosis Date Noted   Neurocognitive deficits 03/02/2024   Gastroesophageal reflux disease 02/27/2024   Closed displaced fracture of left femoral neck (HCC) 02/24/2024   Chronic kidney disease (CKD) stage G3a/A1, moderately decreased glomerular filtration rate (GFR) between 45-59 mL/min/1.73 square meter and albuminuria creatinine ratio less than 30  mg/g (HCC) 05/23/2023   Left renal atrophy 05/08/2022   Enlarged thyroid  11/07/2021   Fatigue 04/23/2021   Irregular heartbeat 04/23/2021   Mixed hyperlipidemia 04/23/2021   Vitamin D  insufficiency 04/23/2021   Borderline prolonged QT interval 02/02/2020   Polycythemia 02/01/2020   Essential hypertension    History of colonic polyps 04/24/2015    PCP: Suanne Pfeiffer, NP   REFERRING PROVIDER: Margrette Taft BRAVO, MD  REFERRING DIAG: [phone] (ICD-10-CM) - Closed displaced fracture of left femoral neck (HCC)  THERAPY DIAG:  Pain in left hip  Weakness of left lower extremity  Impaired functional mobility, balance, gait, and endurance  Rationale for Evaluation and Treatment: Rehabilitation  ONSET DATE: Early this year, does not remember date  SUBJECTIVE:   SUBJECTIVE STATEMENT: Reports she has been tired with walking and her left hip gets tired. Reports it was slightly sore after gait x 440' with RW.  Evaluation: Pt states getting up early in the morning got 3/4 to the bathroom and all of a sudden left leg collapsed and fell on left side, felt immediate pain. Pt had surgery for hip fx and then went to a rehab for 2 weeks. Pt then had a couple of weeks of home PT after that. Pt states that she is not in pain and states the hip does not hurt much anymore just feels weak on that  side. Before the fall she was not using a walker and was driving.  PERTINENT HISTORY: Hip Fx PAIN:  Are you having pain? No and Yes: NPRS scale: 2/10 Pain location: Lt hip Pain description: Sharp Aggravating factors: Positioning Relieving factors: Rearrange positioning  PRECAUTIONS: Fall  RED FLAGS: None   WEIGHT BEARING RESTRICTIONS: No  FALLS:  Has patient fallen in last 6 months? Yes. Number of falls 1  LIVING ENVIRONMENT: Lives with: lives with their family and lives with their son Lives in: Mobile home Stairs: ramped entrance Has following equipment at home: Single point cane,  Environmental consultant - 2 wheeled, shower chair, and Ramped entry  OCCUPATION: retired  PLOF: Independent  PATIENT GOALS: To get back where she was; walk without the walker, get stronger left lower extremity. Pt would like to return to yard work and driving as well.  NEXT MD VISIT: maybe next Friday   OBJECTIVE:  Note: Objective measures were completed at Evaluation unless otherwise noted.  DIAGNOSTIC FINDINGS: CLINICAL DATA:  Fall and left hip pain.   EXAM: DG HIP (WITH OR WITHOUT PELVIS) 2-3V LEFT   COMPARISON:  Pelvic radiograph dated 02/01/2020.   FINDINGS: There is a mildly displaced and impacted fracture of the left femoral neck. No dislocation. The bones are osteopenic. The soft tissues are unremarkable.   IMPRESSION: Mildly displaced and impacted fracture of the left femoral neck.  PATIENT SURVEYS:  LEFS 39/80 06/25/24:  Lower Extremity Functional Score: 34 / 80 = 42.5 %  COGNITION: Overall cognitive status: Within functional limits for tasks assessed     SENSATION: WFL  EDEMA:  None observed, none reported    PALPATION: Pt reports no tenderness of left hip, assess OP next session  LOWER EXTREMITY ROM:  Active ROM Right eval Left eval Left 7/18/235  Hip flexion 102 100 103  Hip extension 5/20: 5 5/20: 0 06/25/24: 3   Hip abduction     Hip adduction     Hip internal rotation     Hip external rotation     Knee flexion     Knee extension     Ankle dorsiflexion     Ankle plantarflexion     Ankle inversion     Ankle eversion      (Blank rows = not tested)  LOWER EXTREMITY MMT:  MMT Right eval Left eval Left 05/24/24 Left  06/25/25 Right 06/25/24  Hip flexion 4 3+ 4 4 4   Hip extension 4 3+ 2 Prone: 2+ 3+  Hip abduction 4 3+ 2 Sidelying:  2+  3+  Hip adduction 4 3+     Hip internal rotation       Hip external rotation       Knee flexion    4 4  Knee extension   4 4 4+  Ankle dorsiflexion (limited range bilat)   Limited range 4    Ankle plantarflexion        Ankle inversion       Ankle eversion        (Blank rows = not tested)   FUNCTIONAL TESTS:  5 times sit to stand: 16.05 2 minute walk test: 182 feet  06/25/24: 282ft with SPC.  5STS  28.83; 22.48 no HHA  04/27/24:  Functional Gait Assessment Summary completed with RW 1. GAIT LEVEL SURFACE: Moderate impairment -- gait level surface (1) (1 points) 2. CHANGE IN GAIT SPEED: Moderate impairment -- change in gait speed (1) (1 points) 3. GAIT WITH HORIZONTAL HEAD TURNS: Moderate  impairment -- gait with horizontal head turns (1) (1 points) 4. GAIT WITH VERTICAL HEAD TURNS: Moderate impairment -- gait with vertical head turns (1) (1 points) 5. GAIT AND PIVOT TURN: Moderate impairment -- gait and pivot turn (1) (1 points) 6. STEP OVER OBSTACLE: Moderate impairment -- step over obstacle (1) (1 points) 7. GAIT WITH NARROW BASE OF SUPPORT: Moderate impairment -- gait with narrow base of support (1) (1 points) 8. GAIT WITH EYES CLOSED: Severe impairment -- gait with eyes closed (0) (0 points) 9. AMBULATING BACKWARDS: Moderate impairment -- ambulating backwards (1) (1 points) 10. STEPS: Mild impairment -- up and down steps (2) (2 points) Functional Gait Assessment: 10/30=33.3 percent. (Normal for age >20/30)   GAIT: Distance walked: 182 feet Assistive device utilized: Walker - 2 wheeled Level of assistance: Modified independence Comments: Pt demonstrates decreased stride length and stance time on left side.                                                                                                                                TREATMENT DATE:  07/08/24 Gait training w RW x 2 laps w/ cues for heel contact on IC Balance pre gait training sit to stand without UE A from raised mat with ariex standing (required cues for nose over toe) - performed 10 repetitions Step fwd/bwd without UE A and with MOD A for maintaining stability once standing while on airex - cues for foot clearance Stand  to sit with cues for reaching back for safety Heel/toe raises 30 reps with BUE A for improving functional balance and engagement needed for foot clearance LAQ w/ 4# - 3 x 10 High marching down black line w/ RW and cue for knee to bar for improving functional hip flexion and engagement with foot clearance cues Step up/down 10x bilaterally to high step and UE A - CGA  07/06/24 Heel raises 2 x 12 Toe raises 2 x 12 7 steps toe taps with 1 UE assist 2 x 10 Step navigation up and down x 2 Lunge forward and back 2 x 10 each with 1 UE assist  2# hip abduction 2 x 10 2# hip extension 2 x 10 2# marching 2 x 10 Squats with no UE assist 2 x 10 Nustep seat 10 x 5' level 2 to end treatment  07/01/24: Gait training: 226 ft w/ RW, v cues for speed and foot clearance NuStep, seat 10, level 3, 4' Sit to stand holding 5 lb. kettle bell, foam pad in chair, v cues for posture  +10 lb. Kettlebell, 10x  + kettle lifts to chest, 10x Forward step ups, 6 inch step, 10x w/ ea LE leading, BUE-->one UE Prog to standing w/ feet on foam and stepping onto step, 10x w/ each LE leading, BUE-->two finger support Therapeutic Act: Squatting to pick up 5 cones at one end of 10 ft line, side stepping to other end and place cones down at  ground level, CGA throughout  06/25/24: 270 with SPC MMT ROM Lower Extremity Functional Score: 34 / 80 = 42.5 % 5STS complete twice with no HHA: 28.83; 22.48 no HHA  FUNCTIONAL GAIT ASSESSMENT  Date: 07/18 Score  GAIT LEVEL SURFACE Instructions: Walk at your normal speed from here to the next mark (6 m) [20 ft]. (2) Mild impairment - Walks 6 m (20 ft) in less than 7 seconds but greater than 5.5 seconds, uses assistive device, slower speed, mild gait deviations, or deviates 15.24 -25.4 cm (6 -10 in) outside of the 30.48-cm (12-in) walkway width.  2.   CHANGE IN GAIT SPEED Instructions: Begin walking at your normal pace (for 1.5 m [5 ft]). When I tell you "go," walk as fast as  you can (for 1.5 m [5 ft]). When I tell you "slow," walk as slowly as you can (for 1.5 m [5 ft]. (1) Moderate impairment - Makes only minor adjustments to walking speed, or accomplishes a change in speed with significant gait deviations, deviates 25.4 -38.1 cm (10 -15 in) outside the 30.48-cm (12-in) walkway width, or changes speed but loses balance but is able to recover and continue walking.  3.    GAIT WITH HORIZONTAL HEAD TURNS Instructions: Walk from here to the next mark 6 m (20 ft) away. Begin walking at your normal pace. Keep walking straight; after 3 steps, turn your head to the right and keep walking straight while looking to the right. After 3 more steps, turn your head to the left and keep walking straight while looking left. Continue alternating looking right and left. (1) Moderate impairment - Performs head turns with moderate change in gait velocity, slows down, deviates 25.4 -38.1 cm (10 -15 in) outside 30.48-cm (12-in) walkway width but recovers, can continue to walk.  4.   GAIT WITH VERTICAL HEAD TURNS Instructions: Walk from here to the next mark (6 m [20 ft]). Begin walking at your normal pace. Keep walking straight; after 3 steps, tip your head up and keep walking straight while looking up. After 3 more steps, tip your head down, keep walking straight while looking down. Continue  alternating looking up and down every 3 steps until you have completed 2 repetitions in each direction. (1) Moderate impairment - Performs task with moderate change in gait velocity, slows down, deviates 25.4 -38.1 cm (10 -15 in) outside 30.48-cm (12-in) walkway width but recovers, can continue to walk.  5.  GAIT AND PIVOT TURN Instructions: Begin with walking at your normal pace. When I tell you, "turn and stop," turn as quickly as you can to face the opposite direction and stop. (1) Moderate impairment - Turns slowly, requires verbal cueing, or requires several small steps to catch balance following turn and  stop  6.   STEP OVER OBSTACLE Instructions: Begin walking at your normal speed. When you come to the shoe box, step over it, not around it, and keep walking.  2  7.   GAIT WITH NARROW BASE OF SUPPORT Instructions: Walk on the floor with arms folded across the chest, feet aligned heel to toe in tandem for a distance of 3.6 m [12 ft]. The number of steps taken in a straight line are counted for a maximum of 10 steps. 1  8.   GAIT WITH EYES CLOSED Instructions: Walk at your normal speed from here to the next mark (6 m [20 ft]) with your eyes closed. 0  9.   AMBULATING BACKWARDS Instructions: Walk backwards until  I tell you to stop 1  10. STEPS Instructions: Walk up these stairs as you would at home (ie, using the rail if necessary). At the top turn around and walk down. (1) Moderate impairment-Two feet to a stair; must use rail.  Total 12/30    06/29/24: Rocker board x 2 minutes.  Marching x 10  Heel raises x 10 Knee driver x 5 for 10 B Hamstring stretch x 5 for 10 B  Squat no UE assist x 10 Step up 7 step x 10 B SLS required HHA x 3  POWER up x 7 Power rock x 7 Power twist x 7  Power step x 7 Theraband side step with green band around mat x 2 Nustep level 1 at or above 60 STM x 5 minutes   PATIENT EDUCATION:  Education details: Pt was educated on findings of PT evaluation, prognosis, frequency of therapy visits and rationale, attendance policy, and HEP if given.   Person educated: Patient Education method: Explanation, Verbal cues, and Handouts Education comprehension: verbalized understanding, verbal cues required, tactile cues required, and needs further education  HOME EXERCISE PROGRAM: Access Code: [email or web address] URL: [email or web address] Date: 05/18/2024 Prepared by: [facility]  06/10/24: - Side Stepping with Resistance at Thighs and Counter Support  - 1-2 x daily - 7 x weekly - 2 sets - 10 reps - Squat with Chair and Counter Support  - 1 x daily - 7 x weekly  - 3 sets - 10 reps  Exercises - Standing Hip Abduction with Resistance at Ankles and Counter Support  - 1 x daily - 7 x weekly - 2 sets - 10 reps - Standing Hip Extension with Resistance at Ankles and Counter Support  - 1 x daily - 7 x weekly - 2 sets - 10 reps - Standing March with Unilateral Counter Support  - 1 x daily - 7 x weekly - 2 sets - 10 reps - tandem stance balance; try not to hold on  - 1 x daily - 7 x weekly - 1 sets - 5 reps - 15 sec hold Access Code: [email or web address] URL: [email or web address] Date: 05/18/2024 Prepared by: [facility]  Exercises - Supine Bridge  - 1 x daily - 7 x weekly - 3 sets - 10 reps - Sit to Stand Without Arm Support  - 1 x daily - 7 x weekly - 3 sets - 10 reps - Clamshell  - 1 x daily - 7 x weekly - 3 sets - 10 reps - Standing Hip Abduction with Resistance at Ankles and Counter Support  - 1 x daily - 7 x weekly - 2 sets - 10 reps - Standing Hip Extension with Resistance at Ankles and Counter Support  - 1 x daily - 7 x weekly - 2 sets - 10 reps - Standing March with Unilateral Counter Support  - 1 x daily - 7 x weekly - 2 sets - 10 reps - tandem stance balance; try not to hold on  - 1 x daily - 7 x weekly - 1 sets - 5 reps - 15 sec hold Access Code: [email or web address] URL: [email or web address] Date: 04/21/2024 Prepared by: Lang Ada  Exercises - Supine Bridge  - 1 x daily - 7 x weekly - 3 sets - 10 reps - Sit to Stand Without Arm Support  - 1 x daily - 7 x weekly - 3 sets - 10 reps - Clamshell  - 1 x daily - 7 x weekly - 3 sets -  10 reps  ASSESSMENT:  CLINICAL IMPRESSION:  Patient improving with sit to stand and balance interventions with cuing for foot clearance. Reduced SLS time on L LE noted through decreased R foot clearance during step fwd/bwd and hip shift with MIN A for safety. Fatigued quickly with high stepping and noted decreased foot clearance with fatigue during gait training around room as well. Continues to need cues to  fully back up to chair before sitting down. Pt will continue to benefit from skilled intervention to address goals unmet to reduce risk of fall injury.  Evaluation: Patient is a 83 y.o. female who was seen today for physical therapy evaluation and treatment for S72.002A (ICD-10-CM) - Closed displaced fracture of left femoral neck (HCC).  Patient demonstrates decreased LLE strength, abnormal pain in left hip with prolonged standing and walking, and impaired balance. Patient also demonstrates difficulty with ambulation during today's session with decreased left stride length, stance time, and velocity noted. Patient also demonstrates decreased quality of movement with bed mobility as it is painful rolling onto left side. Patient requires verbal and tactile cues for HEP prescription and proper form. Patient would benefit from skilled physical therapy for decreased pain and increased endurance with ambulation, increased LE strength, and balance for improved gait quality, return to higher level of function with ADLs, and progress towards therapy goals.   OBJECTIVE IMPAIRMENTS: Abnormal gait, decreased activity tolerance, decreased balance, decreased endurance, decreased mobility, difficulty walking, decreased strength, and pain.   ACTIVITY LIMITATIONS: carrying, lifting, bending, standing, squatting, stairs, transfers, bed mobility, and locomotion level  PARTICIPATION LIMITATIONS: meal prep, cleaning, laundry, driving, shopping, community activity, and yard work  PERSONAL FACTORS: Age, Fitness, Past/current experiences, and 1 comorbidity: recent fall and hip fx are also affecting patient's functional outcome.   REHAB POTENTIAL: Good  CLINICAL DECISION MAKING: Stable/uncomplicated  EVALUATION COMPLEXITY: Low   GOALS: Goals reviewed with patient? Yes  SHORT TERM GOALS: Target date: 05/12/24  Patient will demonstrate evidence of independence with individualized HEP and will report compliance for at  least 3 days per week for optimized progression towards remaining therapy goals. Baseline: 06/25/24:  Reports compliance with HEP 5 days a week Goal status: MET  2.  Patient will report a decrease in pain level during community ambulation by at least 2 points for improved quality of life. Baseline: 2/10 Goal status: MET     LONG TERM GOALS: Target date: 06/02/24  Pt will demonstrate a an increase of at least 9 points on the LEFS for improved performance of community ambulation and ADL. Baseline: see above Goal status: IN PROGRESS  2.  Pt will improve 2 MWT by 140  in order to demonstrate improved functional ambulatory capacity in community setting.  Baseline: see above; improved by 53 ft; 06/25/24:  improved by 78ft Goal status: IN PROGRESS  3.  Pt will demonstrate WFL pain free ROM in left hip, for increased mobility and maximal efficiency of gait cycle during ambulation. Baseline: see objective Goal status: IN PROGRESS  4.  Pt will demonstrate at least 4/5 MMT for left lower extremity for increased strength during ADL and community ambulation. Baseline: see objective; see above Goal status: IN PROGRESS  5.  Pt will improve 5TSTS by at least 2.3 seconds in order to improve functional strength during functional transfers. Baseline: see above Goal status: IN PROGRESS    PLAN:  PT FREQUENCY: 1-2x/week  PT DURATION: 6 weeks  PLANNED INTERVENTIONS: 97110-Therapeutic exercises, 97530- Therapeutic activity, [id number]- Neuromuscular re-education, 97535- Self  Care, [id number]- Manual therapy, [phone]- Gait training, Patient/Family education, Balance training, Stair training, Joint mobilization, DME instructions, Cryotherapy, and Moist heat  PLAN FOR NEXT SESSION:  .  Continue with balance and gait training and hip strengthening.     10:29 AM, 07/08/24 Lamarr LITTIE Citrin PT, DPT [facility] [phone] office

## 2024-07-12 ENCOUNTER — Encounter (HOSPITAL_COMMUNITY)

## 2024-07-14 ENCOUNTER — Ambulatory Visit (HOSPITAL_COMMUNITY): Attending: Orthopedic Surgery

## 2024-07-14 DIAGNOSIS — R29898 Other symptoms and signs involving the musculoskeletal system: Secondary | ICD-10-CM | POA: Insufficient documentation

## 2024-07-14 DIAGNOSIS — Z7409 Other reduced mobility: Secondary | ICD-10-CM | POA: Diagnosis not present

## 2024-07-14 DIAGNOSIS — M25552 Pain in left hip: Secondary | ICD-10-CM | POA: Insufficient documentation

## 2024-07-14 NOTE — Therapy (Signed)
 OUTPATIENT PHYSICAL THERAPY TREATMENT    Patient Name: Tiffany Everett MRN: [id number] DOB:01-13-1941, 83 y.o., female Today's Date: 07/14/2024  END OF SESSION:  PT End of Session - 07/14/24 0843     Visit Number 22    Number of Visits 25    Date for PT Re-Evaluation 07/23/24    Authorization Type [employer] MEDICARE HMO/PPO    Progress Note Due on Visit 25    PT Start Time 0843    PT Stop Time 0923    PT Time Calculation (min) 40 min    Equipment Utilized During Treatment Gait belt    Activity Tolerance Patient tolerated treatment well    Behavior During Therapy WFL for tasks assessed/performed                     Past Medical History:  Diagnosis Date   Colon polyps    Hypertension    Seasonal allergies    Past Surgical History:  Procedure Laterality Date   APPENDECTOMY     CATARACT EXTRACTION W/PHACO Right 09/13/2013   Procedure: CATARACT EXTRACTION PHACO AND INTRAOCULAR LENS PLACEMENT (IOC);  Surgeon: Dow JULIANNA Burke, MD;  Location: [facility];  Service: Ophthalmology;  Laterality: Right;  CDE:3.25   CATARACT EXTRACTION W/PHACO Left 11/22/2013   Procedure: CATARACT EXTRACTION PHACO AND INTRAOCULAR LENS PLACEMENT (IOC);  Surgeon: Dow JULIANNA Burke, MD;  Location: [facility];  Service: Ophthalmology;  Laterality: Left;  CDE: 3.95   CESAREAN SECTION     x2   COLONOSCOPY     SLF 2010: sigmoid diverticula, otherwise wnl. Repeat 5 yrs.   HIP ARTHROPLASTY Left 02/25/2024   Procedure: HEMIARTHROPLASTY (BIPOLAR) HIP;  Surgeon: Margrette Taft BRAVO, MD;  Location: [facility];  Service: Orthopedics;  Laterality: Left;   I & D EXTREMITY Left    lateral wrist   LAPAROTOMY     oopherectomy   ORIF HUMERUS FRACTURE Left    Patient Active Problem List   Diagnosis Date Noted   Neurocognitive deficits 03/02/2024   Gastroesophageal reflux disease 02/27/2024   Closed displaced fracture of left femoral neck (HCC) 02/24/2024   Chronic kidney disease (CKD) stage G3a/A1, moderately decreased  glomerular filtration rate (GFR) between 45-59 mL/min/1.73 square meter and albuminuria creatinine ratio less than 30 mg/g (HCC) 05/23/2023   Left renal atrophy 05/08/2022   Enlarged thyroid  11/07/2021   Fatigue 04/23/2021   Irregular heartbeat 04/23/2021   Mixed hyperlipidemia 04/23/2021   Vitamin D  insufficiency 04/23/2021   Borderline prolonged QT interval 02/02/2020   Polycythemia 02/01/2020   Essential hypertension    History of colonic polyps 04/24/2015    PCP: Suanne Pfeiffer, NP   REFERRING PROVIDER: Margrette Taft BRAVO, MD  REFERRING DIAG: [phone] (ICD-10-CM) - Closed displaced fracture of left femoral neck (HCC)  THERAPY DIAG:  Pain in left hip  Weakness of left lower extremity  Impaired functional mobility, balance, gait, and endurance  Rationale for Evaluation and Treatment: Rehabilitation  ONSET DATE: Early this year, does not remember date  SUBJECTIVE:   SUBJECTIVE STATEMENT: Patient reports no new pain and no falls.    Evaluation: Pt states getting up early in the morning got 3/4 to the bathroom and all of a sudden left leg collapsed and fell on left side, felt immediate pain. Pt had surgery for hip fx and then went to a rehab for 2 weeks. Pt then had a couple of weeks of home PT after that. Pt states that she is not in pain and states the  hip does not hurt much anymore just feels weak on that side. Before the fall she was not using a walker and was driving.  PERTINENT HISTORY: Hip Fx PAIN:  Are you having pain? No and Yes: NPRS scale: 2/10 Pain location: Lt hip Pain description: Sharp Aggravating factors: Positioning Relieving factors: Rearrange positioning  PRECAUTIONS: Fall  RED FLAGS: None   WEIGHT BEARING RESTRICTIONS: No  FALLS:  Has patient fallen in last 6 months? Yes. Number of falls 1  LIVING ENVIRONMENT: Lives with: lives with their family and lives with their son Lives in: Mobile home Stairs: ramped entrance Has following  equipment at home: Single point cane, Environmental consultant - 2 wheeled, shower chair, and Ramped entry  OCCUPATION: retired  PLOF: Independent  PATIENT GOALS: To get back where she was; walk without the walker, get stronger left lower extremity. Pt would like to return to yard work and driving as well.  NEXT MD VISIT: maybe next Friday   OBJECTIVE:  Note: Objective measures were completed at Evaluation unless otherwise noted.  DIAGNOSTIC FINDINGS: CLINICAL DATA:  Fall and left hip pain.   EXAM: DG HIP (WITH OR WITHOUT PELVIS) 2-3V LEFT   COMPARISON:  Pelvic radiograph dated 02/01/2020.   FINDINGS: There is a mildly displaced and impacted fracture of the left femoral neck. No dislocation. The bones are osteopenic. The soft tissues are unremarkable.   IMPRESSION: Mildly displaced and impacted fracture of the left femoral neck.  PATIENT SURVEYS:  LEFS 39/80 06/25/24:  Lower Extremity Functional Score: 34 / 80 = 42.5 %  COGNITION: Overall cognitive status: Within functional limits for tasks assessed     SENSATION: WFL  EDEMA:  None observed, none reported    PALPATION: Pt reports no tenderness of left hip, assess OP next session  LOWER EXTREMITY ROM:  Active ROM Right eval Left eval Left 7/18/235  Hip flexion 102 100 103  Hip extension 5/20: 5 5/20: 0 06/25/24: 3   Hip abduction     Hip adduction     Hip internal rotation     Hip external rotation     Knee flexion     Knee extension     Ankle dorsiflexion     Ankle plantarflexion     Ankle inversion     Ankle eversion      (Blank rows = not tested)  LOWER EXTREMITY MMT:  MMT Right eval Left eval Left 05/24/24 Left  06/25/25 Right 06/25/24  Hip flexion 4 3+ 4 4 4   Hip extension 4 3+ 2 Prone: 2+ 3+  Hip abduction 4 3+ 2 Sidelying:  2+  3+  Hip adduction 4 3+     Hip internal rotation       Hip external rotation       Knee flexion    4 4  Knee extension   4 4 4+  Ankle dorsiflexion (limited range bilat)    Limited range 4    Ankle plantarflexion       Ankle inversion       Ankle eversion        (Blank rows = not tested)   FUNCTIONAL TESTS:  5 times sit to stand: 16.05 2 minute walk test: 182 feet  06/25/24: 24ft with SPC.  5STS  28.83; 22.48 no HHA  04/27/24:  Functional Gait Assessment Summary completed with RW 1. GAIT LEVEL SURFACE: Moderate impairment -- gait level surface (1) (1 points) 2. CHANGE IN GAIT SPEED: Moderate impairment -- change in gait  speed (1) (1 points) 3. GAIT WITH HORIZONTAL HEAD TURNS: Moderate impairment -- gait with horizontal head turns (1) (1 points) 4. GAIT WITH VERTICAL HEAD TURNS: Moderate impairment -- gait with vertical head turns (1) (1 points) 5. GAIT AND PIVOT TURN: Moderate impairment -- gait and pivot turn (1) (1 points) 6. STEP OVER OBSTACLE: Moderate impairment -- step over obstacle (1) (1 points) 7. GAIT WITH NARROW BASE OF SUPPORT: Moderate impairment -- gait with narrow base of support (1) (1 points) 8. GAIT WITH EYES CLOSED: Severe impairment -- gait with eyes closed (0) (0 points) 9. AMBULATING BACKWARDS: Moderate impairment -- ambulating backwards (1) (1 points) 10. STEPS: Mild impairment -- up and down steps (2) (2 points) Functional Gait Assessment: 10/30=33.3 percent. (Normal for age >20/30)   GAIT: Distance walked: 182 feet Assistive device utilized: Walker - 2 wheeled Level of assistance: Modified independence Comments: Pt demonstrates decreased stride length and stance time on left side.                                                                                                                                TREATMENT DATE:  07/14/24 Nustep seat 8 x 5' dynamic warm up level 3 Sit to stand holding tidal tank against chest x 10 6 box step ups x 10 6 box lateral step ups x 10 6 box step up and over x 10 each leg Green theraband around knees sidestepping in // bars down and back x 5 with 1 UE assist Standing on foam  beam balance x 30; then rhythmic stabilization 2 x 30   07/08/24 Gait training w RW x 2 laps w/ cues for heel contact on IC Balance pre gait training sit to stand without UE A from raised mat with ariex standing (required cues for nose over toe) - performed 10 repetitions Step fwd/bwd without UE A and with MOD A for maintaining stability once standing while on airex - cues for foot clearance Stand to sit with cues for reaching back for safety Heel/toe raises 30 reps with BUE A for improving functional balance and engagement needed for foot clearance LAQ w/ 4# - 3 x 10 High marching down black line w/ RW and cue for knee to bar for improving functional hip flexion and engagement with foot clearance cues Step up/down 10x bilaterally to high step and UE A - CGA  07/06/24 Heel raises 2 x 12 Toe raises 2 x 12 7 steps toe taps with 1 UE assist 2 x 10 Step navigation up and down x 2 Lunge forward and back 2 x 10 each with 1 UE assist  2# hip abduction 2 x 10 2# hip extension 2 x 10 2# marching 2 x 10 Squats with no UE assist 2 x 10 Nustep seat 10 x 5' level 2 to end treatment  07/01/24: Gait training: 226 ft w/ RW, v cues for speed and foot clearance NuStep, seat 10, level  3, 4' Sit to stand holding 5 lb. kettle bell, foam pad in chair, v cues for posture  +10 lb. Kettlebell, 10x  + kettle lifts to chest, 10x Forward step ups, 6 inch step, 10x w/ ea LE leading, BUE-->one UE Prog to standing w/ feet on foam and stepping onto step, 10x w/ each LE leading, BUE-->two finger support Therapeutic Act: Squatting to pick up 5 cones at one end of 10 ft line, side stepping to other end and place cones down at ground level, CGA throughout  06/25/24: 270 with SPC MMT ROM Lower Extremity Functional Score: 34 / 80 = 42.5 % 5STS complete twice with no HHA: 28.83; 22.48 no HHA  FUNCTIONAL GAIT ASSESSMENT  Date: 07/18 Score  GAIT LEVEL SURFACE Instructions: Walk at your normal speed from  here to the next mark (6 m) [20 ft]. (2) Mild impairment - Walks 6 m (20 ft) in less than 7 seconds but greater than 5.5 seconds, uses assistive device, slower speed, mild gait deviations, or deviates 15.24 -25.4 cm (6 -10 in) outside of the 30.48-cm (12-in) walkway width.  2.   CHANGE IN GAIT SPEED Instructions: Begin walking at your normal pace (for 1.5 m [5 ft]). When I tell you "go," walk as fast as you can (for 1.5 m [5 ft]). When I tell you "slow," walk as slowly as you can (for 1.5 m [5 ft]. (1) Moderate impairment - Makes only minor adjustments to walking speed, or accomplishes a change in speed with significant gait deviations, deviates 25.4 -38.1 cm (10 -15 in) outside the 30.48-cm (12-in) walkway width, or changes speed but loses balance but is able to recover and continue walking.  3.    GAIT WITH HORIZONTAL HEAD TURNS Instructions: Walk from here to the next mark 6 m (20 ft) away. Begin walking at your normal pace. Keep walking straight; after 3 steps, turn your head to the right and keep walking straight while looking to the right. After 3 more steps, turn your head to the left and keep walking straight while looking left. Continue alternating looking right and left. (1) Moderate impairment - Performs head turns with moderate change in gait velocity, slows down, deviates 25.4 -38.1 cm (10 -15 in) outside 30.48-cm (12-in) walkway width but recovers, can continue to walk.  4.   GAIT WITH VERTICAL HEAD TURNS Instructions: Walk from here to the next mark (6 m [20 ft]). Begin walking at your normal pace. Keep walking straight; after 3 steps, tip your head up and keep walking straight while looking up. After 3 more steps, tip your head down, keep walking straight while looking down. Continue  alternating looking up and down every 3 steps until you have completed 2 repetitions in each direction. (1) Moderate impairment - Performs task with moderate change in gait velocity, slows down, deviates 25.4  -38.1 cm (10 -15 in) outside 30.48-cm (12-in) walkway width but recovers, can continue to walk.  5.  GAIT AND PIVOT TURN Instructions: Begin with walking at your normal pace. When I tell you, "turn and stop," turn as quickly as you can to face the opposite direction and stop. (1) Moderate impairment - Turns slowly, requires verbal cueing, or requires several small steps to catch balance following turn and stop  6.   STEP OVER OBSTACLE Instructions: Begin walking at your normal speed. When you come to the shoe box, step over it, not around it, and keep walking.  2  7.   GAIT WITH  NARROW BASE OF SUPPORT Instructions: Walk on the floor with arms folded across the chest, feet aligned heel to toe in tandem for a distance of 3.6 m [12 ft]. The number of steps taken in a straight line are counted for a maximum of 10 steps. 1  8.   GAIT WITH EYES CLOSED Instructions: Walk at your normal speed from here to the next mark (6 m [20 ft]) with your eyes closed. 0  9.   AMBULATING BACKWARDS Instructions: Walk backwards until I tell you to stop 1  10. STEPS Instructions: Walk up these stairs as you would at home (ie, using the rail if necessary). At the top turn around and walk down. (1) Moderate impairment-Two feet to a stair; must use rail.  Total 12/30    06/29/24: Rocker board x 2 minutes.  Marching x 10  Heel raises x 10 Knee driver x 5 for 10 B Hamstring stretch x 5 for 10 B  Squat no UE assist x 10 Step up 7 step x 10 B SLS required HHA x 3  POWER up x 7 Power rock x 7 Power twist x 7  Power step x 7 Theraband side step with green band around mat x 2 Nustep level 1 at or above 60 STM x 5 minutes   PATIENT EDUCATION:  Education details: Pt was educated on findings of PT evaluation, prognosis, frequency of therapy visits and rationale, attendance policy, and HEP if given.   Person educated: Patient Education method: Explanation, Verbal cues, and Handouts Education comprehension:  verbalized understanding, verbal cues required, tactile cues required, and needs further education  HOME EXERCISE PROGRAM: Access Code: [email or web address] URL: [email or web address] Date: 05/18/2024 Prepared by: [facility]  06/10/24: - Side Stepping with Resistance at Thighs and Counter Support  - 1-2 x daily - 7 x weekly - 2 sets - 10 reps - Squat with Chair and Counter Support  - 1 x daily - 7 x weekly - 3 sets - 10 reps  Exercises - Standing Hip Abduction with Resistance at Ankles and Counter Support  - 1 x daily - 7 x weekly - 2 sets - 10 reps - Standing Hip Extension with Resistance at Ankles and Counter Support  - 1 x daily - 7 x weekly - 2 sets - 10 reps - Standing March with Unilateral Counter Support  - 1 x daily - 7 x weekly - 2 sets - 10 reps - tandem stance balance; try not to hold on  - 1 x daily - 7 x weekly - 1 sets - 5 reps - 15 sec hold Access Code: [email or web address] URL: [email or web address] Date: 05/18/2024 Prepared by: [facility]  Exercises - Supine Bridge  - 1 x daily - 7 x weekly - 3 sets - 10 reps - Sit to Stand Without Arm Support  - 1 x daily - 7 x weekly - 3 sets - 10 reps - Clamshell  - 1 x daily - 7 x weekly - 3 sets - 10 reps - Standing Hip Abduction with Resistance at Ankles and Counter Support  - 1 x daily - 7 x weekly - 2 sets - 10 reps - Standing Hip Extension with Resistance at Ankles and Counter Support  - 1 x daily - 7 x weekly - 2 sets - 10 reps - Standing March with Unilateral Counter Support  - 1 x daily - 7 x weekly - 2 sets - 10 reps - tandem stance balance; try not to hold  on  - 1 x daily - 7 x weekly - 1 sets - 5 reps - 15 sec hold Access Code: [email or web address] URL: [email or web address] Date: 04/21/2024 Prepared by: Lang Ada  Exercises - Supine Bridge  - 1 x daily - 7 x weekly - 3 sets - 10 reps - Sit to Stand Without Arm Support  - 1 x daily - 7 x weekly - 3 sets - 10 reps - Clamshell  - 1 x daily - 7 x weekly - 3  sets - 10 reps  ASSESSMENT:  CLINICAL IMPRESSION:  Began treatment with Nustep for dynamic warm up.  Patient occasionally impulsive with movements; makes turns too quickly for safety when initially standing up from sitting.  Continues to need reminders to reach back for chair and to fully back up to chair when returning to sitting.  Max challenge with sit to stand with Tidal tank; needs occasional min assist to boost up to standing. Fatigues quickly with step up and overs when leading with left leg; heavily reliant on Ue's to assist. Increased trendelenburg noted as patient fatigues.   Pt will continue to benefit from skilled intervention to address goals unmet to reduce risk of fall injury.  Evaluation: Patient is a 83 y.o. female who was seen today for physical therapy evaluation and treatment for S72.002A (ICD-10-CM) - Closed displaced fracture of left femoral neck (HCC).  Patient demonstrates decreased LLE strength, abnormal pain in left hip with prolonged standing and walking, and impaired balance. Patient also demonstrates difficulty with ambulation during today's session with decreased left stride length, stance time, and velocity noted. Patient also demonstrates decreased quality of movement with bed mobility as it is painful rolling onto left side. Patient requires verbal and tactile cues for HEP prescription and proper form. Patient would benefit from skilled physical therapy for decreased pain and increased endurance with ambulation, increased LE strength, and balance for improved gait quality, return to higher level of function with ADLs, and progress towards therapy goals.   OBJECTIVE IMPAIRMENTS: Abnormal gait, decreased activity tolerance, decreased balance, decreased endurance, decreased mobility, difficulty walking, decreased strength, and pain.   ACTIVITY LIMITATIONS: carrying, lifting, bending, standing, squatting, stairs, transfers, bed mobility, and locomotion level  PARTICIPATION  LIMITATIONS: meal prep, cleaning, laundry, driving, shopping, community activity, and yard work  PERSONAL FACTORS: Age, Fitness, Past/current experiences, and 1 comorbidity: recent fall and hip fx are also affecting patient's functional outcome.   REHAB POTENTIAL: Good  CLINICAL DECISION MAKING: Stable/uncomplicated  EVALUATION COMPLEXITY: Low   GOALS: Goals reviewed with patient? Yes  SHORT TERM GOALS: Target date: 05/12/24  Patient will demonstrate evidence of independence with individualized HEP and will report compliance for at least 3 days per week for optimized progression towards remaining therapy goals. Baseline: 06/25/24:  Reports compliance with HEP 5 days a week Goal status: MET  2.  Patient will report a decrease in pain level during community ambulation by at least 2 points for improved quality of life. Baseline: 2/10 Goal status: MET     LONG TERM GOALS: Target date: 06/02/24  Pt will demonstrate a an increase of at least 9 points on the LEFS for improved performance of community ambulation and ADL. Baseline: see above Goal status: IN PROGRESS  2.  Pt will improve 2 MWT by 140  in order to demonstrate improved functional ambulatory capacity in community setting.  Baseline: see above; improved by 53 ft; 06/25/24:  improved by 23ft Goal status: IN PROGRESS  3.  Pt will  demonstrate WFL pain free ROM in left hip, for increased mobility and maximal efficiency of gait cycle during ambulation. Baseline: see objective Goal status: IN PROGRESS  4.  Pt will demonstrate at least 4/5 MMT for left lower extremity for increased strength during ADL and community ambulation. Baseline: see objective; see above Goal status: IN PROGRESS  5.  Pt will improve 5TSTS by at least 2.3 seconds in order to improve functional strength during functional transfers. Baseline: see above Goal status: IN PROGRESS    PLAN:  PT FREQUENCY: 1-2x/week  PT DURATION: 6 weeks  PLANNED  INTERVENTIONS: 97110-Therapeutic exercises, 97530- Therapeutic activity, [id number]- Neuromuscular re-education, 97535- Self Care, [id number]- Manual therapy, [phone]- Gait training, Patient/Family education, Balance training, Stair training, Joint mobilization, DME instructions, Cryotherapy, and Moist heat  PLAN FOR NEXT SESSION:  .  Continue with balance and gait training and hip strengthening.  Reassess after 3 more visits or 8/15; possible d/c at that time if patient has plateau; she may always need to RW for safety  9:27 AM, 07/14/24 Lakesia Dahle Small [place] Freshour MPT [facility] [place] [phone] Ph:[phone]

## 2024-07-20 ENCOUNTER — Ambulatory Visit (HOSPITAL_COMMUNITY)

## 2024-07-20 ENCOUNTER — Encounter (HOSPITAL_COMMUNITY): Payer: Self-pay

## 2024-07-20 DIAGNOSIS — Z7409 Other reduced mobility: Secondary | ICD-10-CM

## 2024-07-20 DIAGNOSIS — M25552 Pain in left hip: Secondary | ICD-10-CM | POA: Diagnosis not present

## 2024-07-20 DIAGNOSIS — R29898 Other symptoms and signs involving the musculoskeletal system: Secondary | ICD-10-CM | POA: Diagnosis not present

## 2024-07-20 NOTE — Therapy (Signed)
 OUTPATIENT PHYSICAL THERAPY TREATMENT    Patient Name: Tiffany Everett MRN: [id number] DOB:02/22/1941, 83 y.o., female Today's Date: 07/20/2024  END OF SESSION:  PT End of Session - 07/20/24 0852     Visit Number 23    Number of Visits 25    Date for PT Re-Evaluation 07/23/24    Authorization Type [employer] MEDICARE HMO/PPO    Progress Note Due on Visit 25    PT Start Time 0853    PT Stop Time 0931    PT Time Calculation (min) 38 min    Equipment Utilized During Treatment Gait belt    Activity Tolerance Patient tolerated treatment well    Behavior During Therapy WFL for tasks assessed/performed                      Past Medical History:  Diagnosis Date   Colon polyps    Hypertension    Seasonal allergies    Past Surgical History:  Procedure Laterality Date   APPENDECTOMY     CATARACT EXTRACTION W/PHACO Right 09/13/2013   Procedure: CATARACT EXTRACTION PHACO AND INTRAOCULAR LENS PLACEMENT (IOC);  Surgeon: Dow JULIANNA Burke, MD;  Location: [facility];  Service: Ophthalmology;  Laterality: Right;  CDE:3.25   CATARACT EXTRACTION W/PHACO Left 11/22/2013   Procedure: CATARACT EXTRACTION PHACO AND INTRAOCULAR LENS PLACEMENT (IOC);  Surgeon: Dow JULIANNA Burke, MD;  Location: [facility];  Service: Ophthalmology;  Laterality: Left;  CDE: 3.95   CESAREAN SECTION     x2   COLONOSCOPY     SLF 2010: sigmoid diverticula, otherwise wnl. Repeat 5 yrs.   HIP ARTHROPLASTY Left 02/25/2024   Procedure: HEMIARTHROPLASTY (BIPOLAR) HIP;  Surgeon: Margrette Taft BRAVO, MD;  Location: [facility];  Service: Orthopedics;  Laterality: Left;   I & D EXTREMITY Left    lateral wrist   LAPAROTOMY     oopherectomy   ORIF HUMERUS FRACTURE Left    Patient Active Problem List   Diagnosis Date Noted   Neurocognitive deficits 03/02/2024   Gastroesophageal reflux disease 02/27/2024   Closed displaced fracture of left femoral neck (HCC) 02/24/2024   Chronic kidney disease (CKD) stage G3a/A1, moderately  decreased glomerular filtration rate (GFR) between 45-59 mL/min/1.73 square meter and albuminuria creatinine ratio less than 30 mg/g (HCC) 05/23/2023   Left renal atrophy 05/08/2022   Enlarged thyroid  11/07/2021   Fatigue 04/23/2021   Irregular heartbeat 04/23/2021   Mixed hyperlipidemia 04/23/2021   Vitamin D  insufficiency 04/23/2021   Borderline prolonged QT interval 02/02/2020   Polycythemia 02/01/2020   Essential hypertension    History of colonic polyps 04/24/2015    PCP: Suanne Pfeiffer, NP   REFERRING PROVIDER: Margrette Taft BRAVO, MD  REFERRING DIAG: [phone] (ICD-10-CM) - Closed displaced fracture of left femoral neck (HCC)  THERAPY DIAG:  Pain in left hip  Weakness of left lower extremity  Impaired functional mobility, balance, gait, and endurance  Rationale for Evaluation and Treatment: Rehabilitation  ONSET DATE: Early this year, does not remember date  SUBJECTIVE:   SUBJECTIVE STATEMENT: Patient reports no falls but does report she has a sore on the left leg that has been bothering her for about 2 weeks. Pt states she has been doing HEP about 3 days per week.    Evaluation: Pt states getting up early in the morning got 3/4 to the bathroom and all of a sudden left leg collapsed and fell on left side, felt immediate pain. Pt had surgery for hip fx and then went  to a rehab for 2 weeks. Pt then had a couple of weeks of home PT after that. Pt states that she is not in pain and states the hip does not hurt much anymore just feels weak on that side. Before the fall she was not using a walker and was driving.  PERTINENT HISTORY: Hip Fx PAIN:  Are you having pain? No and Yes: NPRS scale: 2/10 Pain location: Lt hip Pain description: Sharp Aggravating factors: Positioning Relieving factors: Rearrange positioning  PRECAUTIONS: Fall  RED FLAGS: None   WEIGHT BEARING RESTRICTIONS: No  FALLS:  Has patient fallen in last 6 months? Yes. Number of falls 1  LIVING  ENVIRONMENT: Lives with: lives with their family and lives with their son Lives in: Mobile home Stairs: ramped entrance Has following equipment at home: Single point cane, Environmental consultant - 2 wheeled, shower chair, and Ramped entry  OCCUPATION: retired  PLOF: Independent  PATIENT GOALS: To get back where she was; walk without the walker, get stronger left lower extremity. Pt would like to return to yard work and driving as well.  NEXT MD VISIT: maybe next Friday   OBJECTIVE:  Note: Objective measures were completed at Evaluation unless otherwise noted.  DIAGNOSTIC FINDINGS: CLINICAL DATA:  Fall and left hip pain.   EXAM: DG HIP (WITH OR WITHOUT PELVIS) 2-3V LEFT   COMPARISON:  Pelvic radiograph dated 02/01/2020.   FINDINGS: There is a mildly displaced and impacted fracture of the left femoral neck. No dislocation. The bones are osteopenic. The soft tissues are unremarkable.   IMPRESSION: Mildly displaced and impacted fracture of the left femoral neck.  PATIENT SURVEYS:  LEFS 39/80 06/25/24:  Lower Extremity Functional Score: 34 / 80 = 42.5 %  COGNITION: Overall cognitive status: Within functional limits for tasks assessed     SENSATION: WFL  EDEMA:  None observed, none reported    PALPATION: Pt reports no tenderness of left hip, assess OP next session  LOWER EXTREMITY ROM:  Active ROM Right eval Left eval Left 7/18/235  Hip flexion 102 100 103  Hip extension 5/20: 5 5/20: 0 06/25/24: 3   Hip abduction     Hip adduction     Hip internal rotation     Hip external rotation     Knee flexion     Knee extension     Ankle dorsiflexion     Ankle plantarflexion     Ankle inversion     Ankle eversion      (Blank rows = not tested)  LOWER EXTREMITY MMT:  MMT Right eval Left eval Left 05/24/24 Left  06/25/25 Right 06/25/24  Hip flexion 4 3+ 4 4 4   Hip extension 4 3+ 2 Prone: 2+ 3+  Hip abduction 4 3+ 2 Sidelying:  2+  3+  Hip adduction 4 3+     Hip internal  rotation       Hip external rotation       Knee flexion    4 4  Knee extension   4 4 4+  Ankle dorsiflexion (limited range bilat)   Limited range 4    Ankle plantarflexion       Ankle inversion       Ankle eversion        (Blank rows = not tested)   FUNCTIONAL TESTS:  5 times sit to stand: 16.05 2 minute walk test: 182 feet  06/25/24: 25ft with SPC.  5STS  28.83; 22.48 no HHA  04/27/24:  Functional Gait  Assessment Summary completed with RW 1. GAIT LEVEL SURFACE: Moderate impairment -- gait level surface (1) (1 points) 2. CHANGE IN GAIT SPEED: Moderate impairment -- change in gait speed (1) (1 points) 3. GAIT WITH HORIZONTAL HEAD TURNS: Moderate impairment -- gait with horizontal head turns (1) (1 points) 4. GAIT WITH VERTICAL HEAD TURNS: Moderate impairment -- gait with vertical head turns (1) (1 points) 5. GAIT AND PIVOT TURN: Moderate impairment -- gait and pivot turn (1) (1 points) 6. STEP OVER OBSTACLE: Moderate impairment -- step over obstacle (1) (1 points) 7. GAIT WITH NARROW BASE OF SUPPORT: Moderate impairment -- gait with narrow base of support (1) (1 points) 8. GAIT WITH EYES CLOSED: Severe impairment -- gait with eyes closed (0) (0 points) 9. AMBULATING BACKWARDS: Moderate impairment -- ambulating backwards (1) (1 points) 10. STEPS: Mild impairment -- up and down steps (2) (2 points) Functional Gait Assessment: 10/30=33.3 percent. (Normal for age >20/30)   GAIT: Distance walked: 182 feet Assistive device utilized: Walker - 2 wheeled Level of assistance: Modified independence Comments: Pt demonstrates decreased stride length and stance time on left side.                                                                                                                                TREATMENT DATE:  07/20/2024  Therapeutic Exercise: -Stationary Bike, 5 minutes, level 3 resistance, pt cued for increased pace  -Standing hip abductions 2 sets 10 reps, bilaterally,  pt cued for upright trunk and maintaining of neutral spine, pt uses BUE support  Neuromuscular Re-education: -Forward lunges on bosu ball, 2 set of 7 reps better performance going into LLE, pt cued for core activation and upright posture -Aeromat walks in parallel bars, 3 lengths of aeromat, tandem and lateral stepping, 2 laps each variation, pt cued for decreased UE support, CGA with gait belt for safety -Hip hikes on 2 inch step, in parallel bars, 2 sets of 10 reps bilaterally, pt cued for decreased UE dependence  Therapeutic Activity: -Sit to stands, 2 sets of 5 reps, pt cued for core activation and decreased UE use -Step ups for speed, 2 bouts of 30 seconds, 10 reps bilaterally, BUE utilized  07/14/24 Nustep seat 8 x 5' dynamic warm up level 3 Sit to stand holding tidal tank against chest x 10 6 box step ups x 10 6 box lateral step ups x 10 6 box step up and over x 10 each leg Green theraband around knees sidestepping in // bars down and back x 5 with 1 UE assist Standing on foam beam balance x 30; then rhythmic stabilization 2 x 30   07/08/24 Gait training w RW x 2 laps w/ cues for heel contact on IC Balance pre gait training sit to stand without UE A from raised mat with ariex standing (required cues for nose over toe) - performed 10 repetitions Step fwd/bwd without UE A and with MOD A for  maintaining stability once standing while on airex - cues for foot clearance Stand to sit with cues for reaching back for safety Heel/toe raises 30 reps with BUE A for improving functional balance and engagement needed for foot clearance LAQ w/ 4# - 3 x 10 High marching down black line w/ RW and cue for knee to bar for improving functional hip flexion and engagement with foot clearance cues Step up/down 10x bilaterally to high step and UE A - CGA   PATIENT EDUCATION:  Education details: Pt was educated on findings of PT evaluation, prognosis, frequency of therapy visits and rationale,  attendance policy, and HEP if given.   Person educated: Patient Education method: Explanation, Verbal cues, and Handouts Education comprehension: verbalized understanding, verbal cues required, tactile cues required, and needs further education  HOME EXERCISE PROGRAM: Access Code: [email or web address] URL: [email or web address] Date: 05/18/2024 Prepared by: [facility]  06/10/24: - Side Stepping with Resistance at Thighs and Counter Support  - 1-2 x daily - 7 x weekly - 2 sets - 10 reps - Squat with Chair and Counter Support  - 1 x daily - 7 x weekly - 3 sets - 10 reps  Exercises - Standing Hip Abduction with Resistance at Ankles and Counter Support  - 1 x daily - 7 x weekly - 2 sets - 10 reps - Standing Hip Extension with Resistance at Ankles and Counter Support  - 1 x daily - 7 x weekly - 2 sets - 10 reps - Standing March with Unilateral Counter Support  - 1 x daily - 7 x weekly - 2 sets - 10 reps - tandem stance balance; try not to hold on  - 1 x daily - 7 x weekly - 1 sets - 5 reps - 15 sec hold Access Code: [email or web address] URL: [email or web address] Date: 05/18/2024 Prepared by: [facility]  Exercises - Supine Bridge  - 1 x daily - 7 x weekly - 3 sets - 10 reps - Sit to Stand Without Arm Support  - 1 x daily - 7 x weekly - 3 sets - 10 reps - Clamshell  - 1 x daily - 7 x weekly - 3 sets - 10 reps - Standing Hip Abduction with Resistance at Ankles and Counter Support  - 1 x daily - 7 x weekly - 2 sets - 10 reps - Standing Hip Extension with Resistance at Ankles and Counter Support  - 1 x daily - 7 x weekly - 2 sets - 10 reps - Standing March with Unilateral Counter Support  - 1 x daily - 7 x weekly - 2 sets - 10 reps - tandem stance balance; try not to hold on  - 1 x daily - 7 x weekly - 1 sets - 5 reps - 15 sec hold Access Code: [email or web address] URL: [email or web address] Date: 04/21/2024 Prepared by: Lang Ada  Exercises - Supine Bridge  - 1 x daily - 7 x  weekly - 3 sets - 10 reps - Sit to Stand Without Arm Support  - 1 x daily - 7 x weekly - 3 sets - 10 reps - Clamshell  - 1 x daily - 7 x weekly - 3 sets - 10 reps  ASSESSMENT:  CLINICAL IMPRESSION:  Patient continues to demonstrate decreased L hip abduction strength, decreased gait quality and balance. Patient also demonstrates decreased endurance with aerobic based exercise during today's session. Pt also presents with sore on LLE on lateral calf closed but with apparent irritation and redness, pt  reminded with basic wound care and to keep wound dressing over problematic area. Patient able to progress dynamic balance and core activation exercises today with aeromat walks with decreased UE support and step up variations, good performance with verbal cueing. Pts hip strength seems to be symmetrical except for the abduction plane. Patient would continue to benefit from skilled physical therapy for increased endurance with ambulation, increased LLE strength, and improved balance for improved quality of life, improved independence with gait training and continued progress towards therapy goals.   Evaluation: Patient is a 83 y.o. female who was seen today for physical therapy evaluation and treatment for S72.002A (ICD-10-CM) - Closed displaced fracture of left femoral neck (HCC).  Patient demonstrates decreased LLE strength, abnormal pain in left hip with prolonged standing and walking, and impaired balance. Patient also demonstrates difficulty with ambulation during today's session with decreased left stride length, stance time, and velocity noted. Patient also demonstrates decreased quality of movement with bed mobility as it is painful rolling onto left side. Patient requires verbal and tactile cues for HEP prescription and proper form. Patient would benefit from skilled physical therapy for decreased pain and increased endurance with ambulation, increased LE strength, and balance for improved gait quality,  return to higher level of function with ADLs, and progress towards therapy goals.   OBJECTIVE IMPAIRMENTS: Abnormal gait, decreased activity tolerance, decreased balance, decreased endurance, decreased mobility, difficulty walking, decreased strength, and pain.   ACTIVITY LIMITATIONS: carrying, lifting, bending, standing, squatting, stairs, transfers, bed mobility, and locomotion level  PARTICIPATION LIMITATIONS: meal prep, cleaning, laundry, driving, shopping, community activity, and yard work  PERSONAL FACTORS: Age, Fitness, Past/current experiences, and 1 comorbidity: recent fall and hip fx are also affecting patient's functional outcome.   REHAB POTENTIAL: Good  CLINICAL DECISION MAKING: Stable/uncomplicated  EVALUATION COMPLEXITY: Low   GOALS: Goals reviewed with patient? Yes  SHORT TERM GOALS: Target date: 05/12/24  Patient will demonstrate evidence of independence with individualized HEP and will report compliance for at least 3 days per week for optimized progression towards remaining therapy goals. Baseline: 06/25/24:  Reports compliance with HEP 5 days a week Goal status: MET  2.  Patient will report a decrease in pain level during community ambulation by at least 2 points for improved quality of life. Baseline: 2/10 Goal status: MET     LONG TERM GOALS: Target date: 06/02/24  Pt will demonstrate a an increase of at least 9 points on the LEFS for improved performance of community ambulation and ADL. Baseline: see above Goal status: IN PROGRESS  2.  Pt will improve 2 MWT by 140  in order to demonstrate improved functional ambulatory capacity in community setting.  Baseline: see above; improved by 53 ft; 06/25/24:  improved by 75ft Goal status: IN PROGRESS  3.  Pt will demonstrate WFL pain free ROM in left hip, for increased mobility and maximal efficiency of gait cycle during ambulation. Baseline: see objective Goal status: IN PROGRESS  4.  Pt will  demonstrate at least 4/5 MMT for left lower extremity for increased strength during ADL and community ambulation. Baseline: see objective; see above Goal status: IN PROGRESS  5.  Pt will improve 5TSTS by at least 2.3 seconds in order to improve functional strength during functional transfers. Baseline: see above Goal status: IN PROGRESS    PLAN:  PT FREQUENCY: 1-2x/week  PT DURATION: 6 weeks  PLANNED INTERVENTIONS: 97110-Therapeutic exercises, 97530- Therapeutic activity, [id number]- Neuromuscular re-education, 97535- Self Care, [id number]- Manual therapy, [id number]- Gait  training, Patient/Family education, Balance training, Stair training, Joint mobilization, DME instructions, Cryotherapy, and Moist heat  PLAN FOR NEXT SESSION:  Continue with balance and gait training and hip strengthening.  Reassess after 3 more visits or 8/15; possible d/c at that time if patient has plateau; she may always need to RW for safety  Lang Ada, PT, DPT [facility] Office: [phone] 9:56 AM, 07/20/24

## 2024-07-21 ENCOUNTER — Ambulatory Visit
Admission: EM | Admit: 2024-07-21 | Discharge: 2024-07-21 | Disposition: A | Discharge status: Home / Self Care | Attending: Family Medicine | Admitting: Family Medicine

## 2024-07-21 DIAGNOSIS — R030 Elevated blood-pressure reading, without diagnosis of hypertension: Secondary | ICD-10-CM

## 2024-07-21 DIAGNOSIS — Z23 Encounter for immunization: Secondary | ICD-10-CM | POA: Diagnosis not present

## 2024-07-21 DIAGNOSIS — L03116 Cellulitis of left lower limb: Secondary | ICD-10-CM | POA: Diagnosis not present

## 2024-07-21 MED ORDER — CEPHALEXIN 500 MG PO CAPS: 500.0000 mg | ORAL_CAPSULE | Freq: Two times a day (BID) | ORAL | 0 refills | Status: AC

## 2024-07-21 MED ORDER — TETANUS-DIPHTH-ACELL PERTUSSIS 5-2.5-18.5 LF-MCG/0.5 IM SUSY
0.5000 mL | PREFILLED_SYRINGE | Freq: Once | INTRAMUSCULAR | Status: AC
  Administered 2024-07-21 (×2): 0.5 mL via INTRAMUSCULAR

## 2024-07-21 MED ORDER — MUPIROCIN 2 % EX OINT: 1.0000 | TOPICAL_OINTMENT | Freq: Two times a day (BID) | CUTANEOUS | 0 refills | Status: AC

## 2024-07-21 MED ORDER — BACITRACIN 500 UNIT/GM EX OINT
1.0000 | TOPICAL_OINTMENT | Freq: Two times a day (BID) | CUTANEOUS | Status: DC
  Administered 2024-07-21 (×2): 1 via TOPICAL

## 2024-07-21 MED ORDER — CHLORHEXIDINE GLUCONATE 4 % EX SOLN: Freq: Every day | CUTANEOUS | 0 refills | Status: AC | PRN

## 2024-07-21 NOTE — ED Triage Notes (Signed)
 Pt reports small cut to the left leg that ocured 2 weeks ago, redness is present at the site of the wound. Pt states she feels as if there is something stuck in the wound.

## 2024-07-21 NOTE — Discharge Instructions (Signed)
 Clean the area at least once a day with the Hibiclens  solution, apply the mupirocin  ointment and use a nonstick gauze pad and Coban wrap as a dressing.  Keep it clean and covered at all times until fully healed.  Elevate your leg at rest to help with swelling as this will improve healing times to keep swelling down.  We have also updated your tetanus shot today.  Take the full course of antibiotics and follow-up with your primary care provider if not fully resolving.

## 2024-07-21 NOTE — ED Provider Notes (Signed)
 [facility]    CSN: [id number] Arrival date & time: 07/21/24  1318      History   Chief Complaint No chief complaint on file.   HPI Tiffany Everett is a 83 y.o. female.   Patient presenting today with her son who provides most of the history with concern for a laceration to the left lower leg that occurred about 2 weeks ago.  States it has been getting cleaned with wound cleaner daily, they have been applying ointment and To cover for about a week, uncovered since.  The area is becoming increasingly more red, painful but she denies fever, chills, weakness, numbness, tingling, drainage, bleeding.  Unsure when her last tetanus shot was, thinks more than 5 years ago.    Past Medical History:  Diagnosis Date   Colon polyps    Hypertension    Seasonal allergies     Patient Active Problem List   Diagnosis Date Noted   Neurocognitive deficits 03/02/2024   Gastroesophageal reflux disease 02/27/2024   Closed displaced fracture of left femoral neck (HCC) 02/24/2024   Chronic kidney disease (CKD) stage G3a/A1, moderately decreased glomerular filtration rate (GFR) between 45-59 mL/min/1.73 square meter and albuminuria creatinine ratio less than 30 mg/g (HCC) 05/23/2023   Left renal atrophy 05/08/2022   Enlarged thyroid  11/07/2021   Fatigue 04/23/2021   Irregular heartbeat 04/23/2021   Mixed hyperlipidemia 04/23/2021   Vitamin D  insufficiency 04/23/2021   Borderline prolonged QT interval 02/02/2020   Polycythemia 02/01/2020   Essential hypertension    History of colonic polyps 04/24/2015    Past Surgical History:  Procedure Laterality Date   APPENDECTOMY     CATARACT EXTRACTION W/PHACO Right 09/13/2013   Procedure: CATARACT EXTRACTION PHACO AND INTRAOCULAR LENS PLACEMENT (IOC);  Surgeon: Dow JULIANNA Burke, MD;  Location: [facility];  Service: Ophthalmology;  Laterality: Right;  CDE:3.25   CATARACT EXTRACTION W/PHACO Left 11/22/2013   Procedure: CATARACT EXTRACTION PHACO  AND INTRAOCULAR LENS PLACEMENT (IOC);  Surgeon: Dow JULIANNA Burke, MD;  Location: [facility];  Service: Ophthalmology;  Laterality: Left;  CDE: 3.95   CESAREAN SECTION     x2   COLONOSCOPY     SLF 2010: sigmoid diverticula, otherwise wnl. Repeat 5 yrs.   HIP ARTHROPLASTY Left 02/25/2024   Procedure: HEMIARTHROPLASTY (BIPOLAR) HIP;  Surgeon: Margrette Taft BRAVO, MD;  Location: [facility];  Service: Orthopedics;  Laterality: Left;   I & D EXTREMITY Left    lateral wrist   LAPAROTOMY     oopherectomy   ORIF HUMERUS FRACTURE Left     OB History   No obstetric history on file.      Home Medications    Prior to Admission medications   Medication Sig Start Date End Date Taking? Authorizing Provider  cephALEXin  (KEFLEX ) 500 MG capsule Take 1 capsule (500 mg total) by mouth 2 (two) times daily. 07/21/24  Yes Stuart Vernell Norris, PA-C  chlorhexidine  (HIBICLENS ) 4 % external liquid Apply topically daily as needed. 07/21/24  Yes Stuart Vernell Norris, PA-C  mupirocin  ointment (BACTROBAN ) 2 % Apply 1 Application topically 2 (two) times daily. 07/21/24  Yes Stuart Vernell Norris, PA-C  apixaban  (ELIQUIS ) 2.5 MG TABS tablet Take 1 tablet (2.5 mg total) by mouth 2 (two) times daily. 02/27/24   Ricky Fines, MD  celecoxib  (CELEBREX ) 200 MG capsule Take 1 capsule (200 mg total) by mouth 2 (two) times daily. 02/27/24   Ricky Fines, MD  docusate sodium  (COLACE) 100 MG capsule Take 1 capsule (100  mg total) by mouth 2 (two) times daily. 02/27/24   Ricky Fines, MD  methocarbamol  (ROBAXIN ) 500 MG tablet Take 1 tablet (500 mg total) by mouth every 8 (eight) hours as needed for muscle spasms. 02/27/24   Ricky Fines, MD  metoprolol  succinate (TOPROL -XL) 25 MG 24 hr tablet Take 25 mg by mouth daily. 06/16/20   [provider]  Multiple Vitamin (MULTIVITAMIN WITH MINERALS) TABS tablet Take 1 tablet by mouth daily. 02/28/24   Ricky Fines, MD  pantoprazole  (PROTONIX ) 40 MG tablet Take 1 tablet (40 mg  total) by mouth daily. 02/27/24 02/26/25  Ricky Fines, MD  polyethylene glycol (MIRALAX ) 17 g packet Take 17 g by mouth daily as needed. 02/27/24   Ricky Fines, MD  simvastatin  (ZOCOR ) 10 MG tablet Take 1 tablet by mouth daily. 03/31/21   [provider]  venlafaxine  XR (EFFEXOR -XR) 37.5 MG 24 hr capsule Take 37.5 mg by mouth daily. 05/22/21   [provider]  Vitamin D , Ergocalciferol , (DRISDOL) 1.25 MG (50000 UNIT) CAPS capsule Take 50,000 Units by mouth every 7 (seven) days.    [provider]    Family History Family History  Problem Relation Age of Onset   Colon cancer Mother 77   Colon cancer Father 54    Social History Social History   Tobacco Use   Smoking status: Never   Smokeless tobacco: Never   Tobacco comments:    Never smoked  Vaping Use   Vaping status: Never Used  Substance Use Topics   Alcohol use: No    Alcohol/week: 0.0 standard drinks of alcohol   Drug use: No     Allergies   Patient has no known allergies.   Review of Systems Review of Systems Per HPI  Physical Exam Triage Vital Signs ED Triage Vitals  Encounter Vitals Group     BP 07/21/24 1321 (!) 176/96     Girls Systolic BP Percentile --      Girls Diastolic BP Percentile --      Boys Systolic BP Percentile --      Boys Diastolic BP Percentile --      Pulse Rate 07/21/24 1321 77     Resp 07/21/24 1321 20     Temp 07/21/24 1321 98.1 F (36.7 C)     Temp Source 07/21/24 1321 Oral     SpO2 07/21/24 1321 94 %     Weight --      Height --      Head Circumference --      Peak Flow --      Pain Score 07/21/24 1325 3     Pain Loc --      Pain Education --      Exclude from Growth Chart --    No data found.  Updated Vital Signs BP (!) 176/96 (BP Location: Right Arm)   Pulse 77   Temp 98.1 F (36.7 C) (Oral)   Resp 20   LMP  (LMP Unknown)   SpO2 94%   Visual Acuity Right Eye Distance:   Left Eye Distance:   Bilateral Distance:    Right Eye Near:    Left Eye Near:    Bilateral Near:     Physical Exam Vitals and nursing note reviewed.  Constitutional:      Appearance: Normal appearance. She is not ill-appearing.  HENT:     Head: Atraumatic.  Eyes:     Extraocular Movements: Extraocular movements intact.     Conjunctiva/sclera: Conjunctivae normal.  Cardiovascular:     Rate and Rhythm: Normal rate.  Pulmonary:     Effort: Pulmonary effort is normal.  Musculoskeletal:        General: Swelling and tenderness present. Normal range of motion.     Cervical back: Normal range of motion and neck supple.     Comments: 1+ edema to the left lower leg, surrounding erythema, warmth, tenderness to palpation with scabbed oval-shaped healing laceration to the left lower leg.  Dried purulent areas within the scabbed region  Skin:    General: Skin is warm and dry.     Findings: Erythema present.  Neurological:     Mental Status: She is alert and oriented to person, place, and time.     Motor: No weakness.     Gait: Gait normal.     Comments: Left lower extremity neurovascularly intact  Psychiatric:        Mood and Affect: Mood normal.        Thought Content: Thought content normal.        Judgment: Judgment normal.      UC Treatments / Results  Labs (all labs ordered are listed, but only abnormal results are displayed) Labs Reviewed - No data to display  EKG   Radiology No results found.  Procedures Procedures (including critical care time)  Medications Ordered in UC Medications  bacitracin  ointment 1 Application (1 Application Topical Given 07/21/24 1358)  Tdap (BOOSTRIX) injection 0.5 mL (0.5 mLs Intramuscular Given 07/21/24 1400)    Initial Impression / Assessment and Plan / UC Course  I have reviewed the triage vital signs and the nursing notes.  Pertinent labs & imaging results that were available during my care of the patient were reviewed by me and considered in my medical decision making (see chart for  details).     Hypertensive in triage, otherwise vital signs reassuring.  Continue monitoring home blood pressure readings, lifestyle modifications reviewed and follow-up with PCP if not improving.  Will treat with Keflex  for cellulitis secondary to a laceration to the left lower leg 2 weeks ago.  Discussed leg elevation to help with swelling, wound care at home with Hibiclens , mupirocin , nonstick dressings.  Tdap updated.  Return for worsening symptoms.  Final Clinical Impressions(s) / UC Diagnoses   Final diagnoses:  Cellulitis of leg, left  Need for Tdap vaccination  Elevated blood pressure reading     Discharge Instructions      Clean the area at least once a day with the Hibiclens  solution, apply the mupirocin  ointment and use a nonstick gauze pad and Coban wrap as a dressing.  Keep it clean and covered at all times until fully healed.  Elevate your leg at rest to help with swelling as this will improve healing times to keep swelling down.  We have also updated your tetanus shot today.  Take the full course of antibiotics and follow-up with your primary care provider if not fully resolving.    ED Prescriptions     Medication Sig Dispense Auth. Provider   chlorhexidine  (HIBICLENS ) 4 % external liquid Apply topically daily as needed. 236 mL Stuart Vernell Norris, PA-C   mupirocin  ointment (BACTROBAN ) 2 % Apply 1 Application topically 2 (two) times daily. 60 g Stuart Vernell Norris, PA-C   cephALEXin  (KEFLEX ) 500 MG capsule Take 1 capsule (500 mg total) by mouth 2 (two) times daily. 14 capsule Stuart Vernell Norris, [place]      PDMP not reviewed this encounter.   Stuart,  Vernell Norris, PA-C 07/21/24 1402

## 2024-07-22 ENCOUNTER — Ambulatory Visit (HOSPITAL_COMMUNITY): Admitting: Physical Therapy

## 2024-07-22 DIAGNOSIS — Z7409 Other reduced mobility: Secondary | ICD-10-CM

## 2024-07-22 DIAGNOSIS — R29898 Other symptoms and signs involving the musculoskeletal system: Secondary | ICD-10-CM | POA: Diagnosis not present

## 2024-07-22 DIAGNOSIS — M25552 Pain in left hip: Secondary | ICD-10-CM

## 2024-07-22 NOTE — Therapy (Addendum)
 OUTPATIENT PHYSICAL THERAPY TREATMENT Progress Note/discharge Reporting Period 06/29/24 to 07/22/24  See note below for Objective Data and Assessment of Progress/Goals.  PHYSICAL THERAPY DISCHARGE SUMMARY  Visits from Start of Care: 24  Current functional level related to goals / functional outcomes: See below   Remaining deficits: See below   Education / Equipment: HEP   Patient agrees to discharge. Patient goals were partially met. Patient is being discharged due to being pleased with the current functional level.      Patient Name: Tiffany Everett MRN: [id number] DOB:May 27, 1941, 83 y.o., female Today's Date: 07/22/2024  END OF SESSION:  PT End of Session - 07/22/24 1058     Visit Number 24    Number of Visits 25    Date for PT Re-Evaluation 07/23/24    Authorization Type [employer] MEDICARE HMO/PPO    Progress Note Due on Visit 25    PT Start Time 1100    PT Stop Time 1140    PT Time Calculation (min) 40 min    Equipment Utilized During Treatment Gait belt    Activity Tolerance Patient tolerated treatment well    Behavior During Therapy WFL for tasks assessed/performed                      Past Medical History:  Diagnosis Date   Colon polyps    Hypertension    Seasonal allergies    Past Surgical History:  Procedure Laterality Date   APPENDECTOMY     CATARACT EXTRACTION W/PHACO Right 09/13/2013   Procedure: CATARACT EXTRACTION PHACO AND INTRAOCULAR LENS PLACEMENT (IOC);  Surgeon: Dow JULIANNA Burke, MD;  Location: [facility];  Service: Ophthalmology;  Laterality: Right;  CDE:3.25   CATARACT EXTRACTION W/PHACO Left 11/22/2013   Procedure: CATARACT EXTRACTION PHACO AND INTRAOCULAR LENS PLACEMENT (IOC);  Surgeon: Dow JULIANNA Burke, MD;  Location: [facility];  Service: Ophthalmology;  Laterality: Left;  CDE: 3.95   CESAREAN SECTION     x2   COLONOSCOPY     SLF 2010: sigmoid diverticula, otherwise wnl. Repeat 5 yrs.   HIP ARTHROPLASTY Left 02/25/2024   Procedure:  HEMIARTHROPLASTY (BIPOLAR) HIP;  Surgeon: Margrette Taft BRAVO, MD;  Location: [facility];  Service: Orthopedics;  Laterality: Left;   I & D EXTREMITY Left    lateral wrist   LAPAROTOMY     oopherectomy   ORIF HUMERUS FRACTURE Left    Patient Active Problem List   Diagnosis Date Noted   Neurocognitive deficits 03/02/2024   Gastroesophageal reflux disease 02/27/2024   Closed displaced fracture of left femoral neck (HCC) 02/24/2024   Chronic kidney disease (CKD) stage G3a/A1, moderately decreased glomerular filtration rate (GFR) between 45-59 mL/min/1.73 square meter and albuminuria creatinine ratio less than 30 mg/g (HCC) 05/23/2023   Left renal atrophy 05/08/2022   Enlarged thyroid  11/07/2021   Fatigue 04/23/2021   Irregular heartbeat 04/23/2021   Mixed hyperlipidemia 04/23/2021   Vitamin D  insufficiency 04/23/2021   Borderline prolonged QT interval 02/02/2020   Polycythemia 02/01/2020   Essential hypertension    History of colonic polyps 04/24/2015    PCP: Suanne Pfeiffer, NP   REFERRING PROVIDER: Margrette Taft BRAVO, MD  REFERRING DIAG: [phone] (ICD-10-CM) - Closed displaced fracture of left femoral neck (HCC)  THERAPY DIAG:  Pain in left hip  Weakness of left lower extremity  Impaired functional mobility, balance, gait, and endurance  Rationale for Evaluation and Treatment: Rehabilitation  ONSET DATE: Early this year, does not remember date  SUBJECTIVE:  SUBJECTIVE STATEMENT: Patient reports no falls and no pain.  Continues to ambulate mostly with her walker but feels safer doing so.  Reports compliance with HEP.    Evaluation: Pt states getting up early in the morning got 3/4 to the bathroom and all of a sudden left leg collapsed and fell on left side, felt immediate pain. Pt had surgery for hip fx and then went to a rehab for 2 weeks. Pt then had a couple of weeks of home PT after that. Pt states that she is not in pain and states the hip does not hurt much anymore  just feels weak on that side. Before the fall she was not using a walker and was driving.  PERTINENT HISTORY: Hip Fx PAIN:  Are you having pain? No  PRECAUTIONS: Fall  RED FLAGS: None   WEIGHT BEARING RESTRICTIONS: No  FALLS:  Has patient fallen in last 6 months? Yes. Number of falls 1  LIVING ENVIRONMENT: Lives with: lives with their family and lives with their son Lives in: Mobile home Stairs: ramped entrance Has following equipment at home: Single point cane, Environmental consultant - 2 wheeled, shower chair, and Ramped entry  OCCUPATION: retired  PLOF: Independent  PATIENT GOALS: To get back where she was; walk without the walker, get stronger left lower extremity. Pt would like to return to yard work and driving as well.  NEXT MD VISIT: maybe next Friday   OBJECTIVE:  Note: Objective measures were completed at Evaluation unless otherwise noted.  DIAGNOSTIC FINDINGS: CLINICAL DATA:  Fall and left hip pain.   EXAM: DG HIP (WITH OR WITHOUT PELVIS) 2-3V LEFT   COMPARISON:  Pelvic radiograph dated 02/01/2020.   FINDINGS: There is a mildly displaced and impacted fracture of the left femoral neck. No dislocation. The bones are osteopenic. The soft tissues are unremarkable.   IMPRESSION: Mildly displaced and impacted fracture of the left femoral neck.  PATIENT SURVEYS:  LEFS 39/80 06/25/24:  Lower Extremity Functional Score: 34 / 80 = 42.5 % 07/22/24:  44/80=55%  COGNITION: Overall cognitive status: Within functional limits for tasks assessed     SENSATION: WFL  EDEMA:  None observed, none reported    PALPATION: Pt reports no tenderness of left hip, assess OP next session  LOWER EXTREMITY ROM:  Active ROM Right eval Left eval Left 7/18/235  Hip flexion 102 100 103  Hip extension 5/20: 5 5/20: 0 06/25/24: 3   Hip abduction     Hip adduction     Hip internal rotation     Hip external rotation     Knee flexion     Knee extension     Ankle dorsiflexion      Ankle plantarflexion     Ankle inversion     Ankle eversion      (Blank rows = not tested)  LOWER EXTREMITY MMT:  MMT Right eval Left eval Left 05/24/24 Left  06/25/25 Right 06/25/24 Left 07/22/24 Right 07/22/24  Hip flexion 4 3+ 4 4 4 5 5   Hip extension 4 3+ 2 Prone: 2+ 3+ 3- 3+  Hip abduction 4 3+ 2 Sidelying:  2+  3+ 3- 4  Hip adduction 4 3+    4 4  Hip internal rotation         Hip external rotation         Knee flexion    4 4 4 4   Knee extension   4 4 4+ 4+ 4+  Ankle dorsiflexion (limited range bilat)  Limited range 4      Ankle plantarflexion         Ankle inversion         Ankle eversion          (Blank rows = not tested)   FUNCTIONAL TESTS:  Evaluation:  5 times sit to stand: 16.05 2 minute walk test: 182 feet  06/25/24:  226ft with SPC.  5STS  28.83; 22.48 no HHA  07/22/24:   250 ft with SPC (was 231ft with SPC)  5STS   25.56 no HHA  (was 28.83; 22.48 no HHA)   04/27/24:  Functional Gait Assessment Summary completed with RW 1. GAIT LEVEL SURFACE: Moderate impairment -- gait level surface (1) (1 points) 2. CHANGE IN GAIT SPEED: Moderate impairment -- change in gait speed (1) (1 points) 3. GAIT WITH HORIZONTAL HEAD TURNS: Moderate impairment -- gait with horizontal head turns (1) (1 points) 4. GAIT WITH VERTICAL HEAD TURNS: Moderate impairment -- gait with vertical head turns (1) (1 points) 5. GAIT AND PIVOT TURN: Moderate impairment -- gait and pivot turn (1) (1 points) 6. STEP OVER OBSTACLE: Moderate impairment -- step over obstacle (1) (1 points) 7. GAIT WITH NARROW BASE OF SUPPORT: Moderate impairment -- gait with narrow base of support (1) (1 points) 8. GAIT WITH EYES CLOSED: Severe impairment -- gait with eyes closed (0) (0 points) 9. AMBULATING BACKWARDS: Moderate impairment -- ambulating backwards (1) (1 points) 10. STEPS: Mild impairment -- up and down steps (2) (2 points) Functional Gait Assessment: 10/30=33.3 percent. (Normal for age  >20/30)   GAIT: Distance walked: 182 feet Assistive device utilized: Walker - 2 wheeled Level of assistance: Modified independence Comments: Pt demonstrates decreased stride length and stance time on left side.                                                                                                                                TREATMENT DATE:  07/22/24 Progress note/re-evaluation   250 ft with SPC (was 258ft with SPC) 5STS   25.56 no HHA  (was 28.83; 22.48 no HHA) MMT see above LEFS:  44/80=55% (improved) Goal review discharge instructions  07/20/2024  Therapeutic Exercise: -Stationary Bike, 5 minutes, level 3 resistance, pt cued for increased pace  -Standing hip abductions 2 sets 10 reps, bilaterally, pt cued for upright trunk and maintaining of neutral spine, pt uses BUE support  Neuromuscular Re-education: -Forward lunges on bosu ball, 2 set of 7 reps better performance going into LLE, pt cued for core activation and upright posture -Aeromat walks in parallel bars, 3 lengths of aeromat, tandem and lateral stepping, 2 laps each variation, pt cued for decreased UE support, CGA with gait belt for safety -Hip hikes on 2 inch step, in parallel bars, 2 sets of 10 reps bilaterally, pt cued for decreased UE dependence  Therapeutic Activity: -Sit to stands, 2 sets of 5 reps, pt cued for core  activation and decreased UE use -Step ups for speed, 2 bouts of 30 seconds, 10 reps bilaterally, BUE utilized  07/14/24 Nustep seat 8 x 5' dynamic warm up level 3 Sit to stand holding tidal tank against chest x 10 6 box step ups x 10 6 box lateral step ups x 10 6 box step up and over x 10 each leg Green theraband around knees sidestepping in // bars down and back x 5 with 1 UE assist Standing on foam beam balance x 30; then rhythmic stabilization 2 x 30   07/08/24 Gait training w RW x 2 laps w/ cues for heel contact on IC Balance pre gait training sit to stand without UE A  from raised mat with ariex standing (required cues for nose over toe) - performed 10 repetitions Step fwd/bwd without UE A and with MOD A for maintaining stability once standing while on airex - cues for foot clearance Stand to sit with cues for reaching back for safety Heel/toe raises 30 reps with BUE A for improving functional balance and engagement needed for foot clearance LAQ w/ 4# - 3 x 10 High marching down black line w/ RW and cue for knee to bar for improving functional hip flexion and engagement with foot clearance cues Step up/down 10x bilaterally to high step and UE A - CGA   PATIENT EDUCATION:  Education details: Pt was educated on findings of PT evaluation, prognosis, frequency of therapy visits and rationale, attendance policy, and HEP if given.   Person educated: Patient Education method: Explanation, Verbal cues, and Handouts Education comprehension: verbalized understanding, verbal cues required, tactile cues required, and needs further education  HOME EXERCISE PROGRAM: Access Code: [email or web address] URL: [email or web address] Date: 05/18/2024 Prepared by: [facility]  06/10/24: - Side Stepping with Resistance at Thighs and Counter Support  - 1-2 x daily - 7 x weekly - 2 sets - 10 reps - Squat with Chair and Counter Support  - 1 x daily - 7 x weekly - 3 sets - 10 reps  Exercises - Standing Hip Abduction with Resistance at Ankles and Counter Support  - 1 x daily - 7 x weekly - 2 sets - 10 reps - Standing Hip Extension with Resistance at Ankles and Counter Support  - 1 x daily - 7 x weekly - 2 sets - 10 reps - Standing March with Unilateral Counter Support  - 1 x daily - 7 x weekly - 2 sets - 10 reps - tandem stance balance; try not to hold on  - 1 x daily - 7 x weekly - 1 sets - 5 reps - 15 sec hold Access Code: [email or web address] URL: [email or web address] Date: 05/18/2024 Prepared by: [facility]  Exercises - Supine Bridge  - 1 x daily - 7 x weekly - 3  sets - 10 reps - Sit to Stand Without Arm Support  - 1 x daily - 7 x weekly - 3 sets - 10 reps - Clamshell  - 1 x daily - 7 x weekly - 3 sets - 10 reps - Standing Hip Abduction with Resistance at Ankles and Counter Support  - 1 x daily - 7 x weekly - 2 sets - 10 reps - Standing Hip Extension with Resistance at Ankles and Counter Support  - 1 x daily - 7 x weekly - 2 sets - 10 reps - Standing March with Unilateral Counter Support  - 1 x daily - 7 x weekly - 2 sets - 10 reps -  tandem stance balance; try not to hold on  - 1 x daily - 7 x weekly - 1 sets - 5 reps - 15 sec hold Access Code: [email or web address] URL: [email or web address] Date: 04/21/2024 Prepared by: Lang Ada  Exercises - Supine Bridge  - 1 x daily - 7 x weekly - 3 sets - 10 reps - Sit to Stand Without Arm Support  - 1 x daily - 7 x weekly - 3 sets - 10 reps - Clamshell  - 1 x daily - 7 x weekly - 3 sets - 10 reps  ASSESSMENT:  CLINICAL IMPRESSION:  Patient re-evaluated this session.  Pt with overall improvements since beginning therapy 3 months ago.  Pt has not had any falls and with increased LE strength and stability.  Pt does continue to use RW, however reports comfort in doing so.  Pt with good stability with SPC as practiced in clinic also.  Re-test of functional measurements today had pt meeting 2 additional long term goals.  Pt feels she is ready for discharge at this time.  No questions or concerns regarding HEP.   Evaluation: Patient is a 82 y.o. female who was seen today for physical therapy evaluation and treatment for S72.002A (ICD-10-CM) - Closed displaced fracture of left femoral neck (HCC).  Patient demonstrates decreased LLE strength, abnormal pain in left hip with prolonged standing and walking, and impaired balance. Patient also demonstrates difficulty with ambulation during today's session with decreased left stride length, stance time, and velocity noted. Patient also demonstrates decreased quality of  movement with bed mobility as it is painful rolling onto left side. Patient requires verbal and tactile cues for HEP prescription and proper form. Patient would benefit from skilled physical therapy for decreased pain and increased endurance with ambulation, increased LE strength, and balance for improved gait quality, return to higher level of function with ADLs, and progress towards therapy goals.   OBJECTIVE IMPAIRMENTS: Abnormal gait, decreased activity tolerance, decreased balance, decreased endurance, decreased mobility, difficulty walking, decreased strength, and pain.   ACTIVITY LIMITATIONS: carrying, lifting, bending, standing, squatting, stairs, transfers, bed mobility, and locomotion level  PARTICIPATION LIMITATIONS: meal prep, cleaning, laundry, driving, shopping, community activity, and yard work  PERSONAL FACTORS: Age, Fitness, Past/current experiences, and 1 comorbidity: recent fall and hip fx are also affecting patient's functional outcome.   REHAB POTENTIAL: Good  CLINICAL DECISION MAKING: Stable/uncomplicated  EVALUATION COMPLEXITY: Low   GOALS: Goals reviewed with patient? Yes  SHORT TERM GOALS: Target date: 05/12/24  Patient will demonstrate evidence of independence with individualized HEP and will report compliance for at least 3 days per week for optimized progression towards remaining therapy goals. Baseline: 06/25/24:  Reports compliance with HEP 5 days a week Goal status: MET  2.  Patient will report a decrease in pain level during community ambulation by at least 2 points for improved quality of life. Baseline: 2/10 Goal status: MET     LONG TERM GOALS: Target date: 06/02/24  Pt will demonstrate a an increase of at least 9 points on the LEFS for improved performance of community ambulation and ADL. Baseline: see above Goal status: MET  2.  Pt will improve 2 MWT by 140  in order to demonstrate improved functional ambulatory capacity in community setting.   Baseline: see above; improved by 53 ft; 06/25/24:  improved by 48ft Goal status: NOT MET  3.  Pt will demonstrate WFL pain free ROM in left hip, for increased mobility and maximal efficiency of gait cycle  during ambulation. Baseline: see objective Goal status: MET  4.  Pt will demonstrate at least 4/5 MMT for left lower extremity for increased strength during ADL and community ambulation. Baseline: see objective; see above Goal status: NOT MET partly met for some motions  5.  Pt will improve 5TSTS by at least 2.3 seconds in order to improve functional strength during functional transfers. Baseline: see above   Goal status: NOT MET    PLAN:  PT FREQUENCY: 1-2x/week  PT DURATION: 6 weeks  PLANNED INTERVENTIONS: 97110-Therapeutic exercises, 97530- Therapeutic activity, [id number]- Neuromuscular re-education, 97535- Self Care, [id number]- Manual therapy, [id number]- Gait training, Patient/Family education, Balance training, Stair training, Joint mobilization, DME instructions, Cryotherapy, and Moist heat  PLAN FOR NEXT SESSION: Discharge.  Pt aware she may always need to RW for safety  Greig KATHEE Fuse, PTA/CLT [facility] Ph: [phone] 10:58 AM, 07/22/24

## 2024-07-27 ENCOUNTER — Encounter (HOSPITAL_COMMUNITY)

## 2024-07-29 ENCOUNTER — Encounter (HOSPITAL_COMMUNITY)

## 2024-08-03 ENCOUNTER — Encounter (HOSPITAL_COMMUNITY)

## 2024-08-05 ENCOUNTER — Encounter (HOSPITAL_COMMUNITY): Admitting: Physical Therapy

## 2024-08-10 ENCOUNTER — Encounter (HOSPITAL_COMMUNITY)

## 2024-08-12 ENCOUNTER — Encounter (HOSPITAL_COMMUNITY): Admitting: Physical Therapy

## 2024-08-13 DIAGNOSIS — S81802A Unspecified open wound, left lower leg, initial encounter: Secondary | ICD-10-CM | POA: Diagnosis not present

## 2024-08-13 DIAGNOSIS — Z1330 Encounter for screening examination for mental health and behavioral disorders, unspecified: Secondary | ICD-10-CM | POA: Diagnosis not present

## 2024-08-13 DIAGNOSIS — Z23 Encounter for immunization: Secondary | ICD-10-CM | POA: Diagnosis not present

## 2024-08-23 ENCOUNTER — Ambulatory Visit: Admitting: Orthopedic Surgery

## 2024-08-23 DIAGNOSIS — S72002A Fracture of unspecified part of neck of left femur, initial encounter for closed fracture: Secondary | ICD-10-CM | POA: Diagnosis not present

## 2024-08-23 NOTE — Progress Notes (Signed)
   LMP  (LMP Unknown)   There is no height or weight on file to calculate BMI.  Chief Complaint  Patient presents with   Post-op Follow-up    Left hip     Encounter Diagnosis  Name Primary?   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 ___________________________  DOI/DOS/ Date: 02/25/24  Improved

## 2024-08-23 NOTE — Progress Notes (Signed)
     Chief Complaint  Patient presents with   Post-op Follow-up    Left hip     Encounter Diagnosis  Name Primary?   Closed displaced fracture of left femoral neck (HCC) 02/25/24 bipolar replacement Yes   Pharmacy  [facility] [phone] - [place], [place] - [address] AT [facility] [address]   What pharmacy do you use ? 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  She does not walk without her walker.  However she does well with the walker.  She is able to go out in the community with her family and they are happy with the results  She has a slight flexion contracture in the left knee.  Her leg lengths are relatively equal she has painless range of motion in her hip with some palpable tenderness just above the greater trochanter consistent with her lateral approach  Recommend follow-up at 1 year for x-rays So far we are doing well and the family is happy

## 2024-08-24 DIAGNOSIS — N261 Atrophy of kidney (terminal): Secondary | ICD-10-CM | POA: Diagnosis not present

## 2024-08-24 DIAGNOSIS — E559 Vitamin D deficiency, unspecified: Secondary | ICD-10-CM | POA: Diagnosis not present

## 2024-08-24 DIAGNOSIS — Z8679 Personal history of other diseases of the circulatory system: Secondary | ICD-10-CM | POA: Diagnosis not present

## 2024-08-24 DIAGNOSIS — S81802D Unspecified open wound, left lower leg, subsequent encounter: Secondary | ICD-10-CM | POA: Diagnosis not present

## 2024-09-16 DIAGNOSIS — I10 Essential (primary) hypertension: Secondary | ICD-10-CM | POA: Diagnosis not present

## 2024-09-16 DIAGNOSIS — I491 Atrial premature depolarization: Secondary | ICD-10-CM | POA: Diagnosis not present

## 2024-09-16 DIAGNOSIS — I5020 Unspecified systolic (congestive) heart failure: Secondary | ICD-10-CM | POA: Diagnosis not present

## 2024-09-29 DIAGNOSIS — I824Y9 Acute embolism and thrombosis of unspecified deep veins of unspecified proximal lower extremity: Secondary | ICD-10-CM | POA: Diagnosis not present

## 2024-09-29 DIAGNOSIS — L03116 Cellulitis of left lower limb: Secondary | ICD-10-CM | POA: Diagnosis not present

## 2024-09-29 DIAGNOSIS — M7989 Other specified soft tissue disorders: Secondary | ICD-10-CM | POA: Diagnosis not present

## 2024-09-29 DIAGNOSIS — M79605 Pain in left leg: Secondary | ICD-10-CM | POA: Diagnosis not present

## 2024-09-30 DIAGNOSIS — M7989 Other specified soft tissue disorders: Secondary | ICD-10-CM | POA: Diagnosis not present

## 2024-09-30 DIAGNOSIS — I82442 Acute embolism and thrombosis of left tibial vein: Secondary | ICD-10-CM | POA: Diagnosis not present

## 2024-09-30 DIAGNOSIS — L03116 Cellulitis of left lower limb: Secondary | ICD-10-CM | POA: Diagnosis not present

## 2024-09-30 DIAGNOSIS — I82412 Acute embolism and thrombosis of left femoral vein: Secondary | ICD-10-CM | POA: Diagnosis not present

## 2024-09-30 DIAGNOSIS — M79605 Pain in left leg: Secondary | ICD-10-CM | POA: Diagnosis not present

## 2024-09-30 DIAGNOSIS — I82432 Acute embolism and thrombosis of left popliteal vein: Secondary | ICD-10-CM | POA: Diagnosis not present

## 2024-10-13 DIAGNOSIS — I10 Essential (primary) hypertension: Secondary | ICD-10-CM | POA: Diagnosis not present

## 2024-10-13 DIAGNOSIS — K5909 Other constipation: Secondary | ICD-10-CM | POA: Diagnosis not present

## 2024-10-13 DIAGNOSIS — I82492 Acute embolism and thrombosis of other specified deep vein of left lower extremity: Secondary | ICD-10-CM | POA: Diagnosis not present

## 2024-10-13 DIAGNOSIS — I5020 Unspecified systolic (congestive) heart failure: Secondary | ICD-10-CM | POA: Diagnosis not present

## 2024-11-24 DIAGNOSIS — I739 Peripheral vascular disease, unspecified: Secondary | ICD-10-CM | POA: Diagnosis not present

## 2024-11-24 DIAGNOSIS — Z79899 Other long term (current) drug therapy: Secondary | ICD-10-CM | POA: Diagnosis not present

## 2024-11-24 DIAGNOSIS — R7301 Impaired fasting glucose: Secondary | ICD-10-CM | POA: Diagnosis not present

## 2024-11-24 DIAGNOSIS — E559 Vitamin D deficiency, unspecified: Secondary | ICD-10-CM | POA: Diagnosis not present

## 2024-11-24 DIAGNOSIS — E782 Mixed hyperlipidemia: Secondary | ICD-10-CM | POA: Diagnosis not present

## 2024-11-24 DIAGNOSIS — Z0000 Encounter for general adult medical examination without abnormal findings: Secondary | ICD-10-CM | POA: Diagnosis not present

## 2024-11-24 DIAGNOSIS — N1831 Chronic kidney disease, stage 3a: Secondary | ICD-10-CM | POA: Diagnosis not present

## 2024-12-22 ENCOUNTER — Other Ambulatory Visit: Payer: Self-pay | Admitting: Vascular Surgery

## 2024-12-22 DIAGNOSIS — I739 Peripheral vascular disease, unspecified: Secondary | ICD-10-CM

## 2025-01-18 ENCOUNTER — Encounter

## 2025-01-18 ENCOUNTER — Encounter: Admitting: Vascular Surgery

## 2025-02-17 ENCOUNTER — Ambulatory Visit: Admitting: Orthopedic Surgery
